# Patient Record
Sex: Female | Born: 1990 | Race: White | Hispanic: No | Smoking: Never smoker
Health system: Southern US, Community
[De-identification: ages and names within clinical notes are randomized; demographics above are authoritative.]

## PROBLEM LIST (undated history)

## (undated) ENCOUNTER — Inpatient Hospital Stay (HOSPITAL_COMMUNITY): Payer: Self-pay

## (undated) DIAGNOSIS — R000 Tachycardia, unspecified: Secondary | ICD-10-CM

## (undated) DIAGNOSIS — F419 Anxiety disorder, unspecified: Secondary | ICD-10-CM

## (undated) DIAGNOSIS — F32A Depression, unspecified: Secondary | ICD-10-CM

## (undated) DIAGNOSIS — F329 Major depressive disorder, single episode, unspecified: Secondary | ICD-10-CM

## (undated) DIAGNOSIS — D649 Anemia, unspecified: Secondary | ICD-10-CM

## (undated) DIAGNOSIS — K047 Periapical abscess without sinus: Secondary | ICD-10-CM

## (undated) DIAGNOSIS — E663 Overweight: Secondary | ICD-10-CM

## (undated) DIAGNOSIS — E876 Hypokalemia: Secondary | ICD-10-CM

## (undated) DIAGNOSIS — Z0000 Encounter for general adult medical examination without abnormal findings: Secondary | ICD-10-CM

## (undated) DIAGNOSIS — Z124 Encounter for screening for malignant neoplasm of cervix: Principal | ICD-10-CM

## (undated) DIAGNOSIS — M549 Dorsalgia, unspecified: Secondary | ICD-10-CM

## (undated) DIAGNOSIS — N946 Dysmenorrhea, unspecified: Secondary | ICD-10-CM

## (undated) DIAGNOSIS — T7840XA Allergy, unspecified, initial encounter: Secondary | ICD-10-CM

## (undated) DIAGNOSIS — G4700 Insomnia, unspecified: Secondary | ICD-10-CM

## (undated) DIAGNOSIS — S161XXA Strain of muscle, fascia and tendon at neck level, initial encounter: Secondary | ICD-10-CM

## (undated) HISTORY — DX: Tachycardia, unspecified: R00.0

## (undated) HISTORY — DX: Depression, unspecified: F32.A

## (undated) HISTORY — DX: Encounter for general adult medical examination without abnormal findings: Z00.00

## (undated) HISTORY — PX: WISDOM TOOTH EXTRACTION: SHX21

## (undated) HISTORY — DX: Dorsalgia, unspecified: M54.9

## (undated) HISTORY — DX: Anxiety disorder, unspecified: F41.9

## (undated) HISTORY — DX: Overweight: E66.3

## (undated) HISTORY — DX: Periapical abscess without sinus: K04.7

## (undated) HISTORY — DX: Encounter for screening for malignant neoplasm of cervix: Z12.4

## (undated) HISTORY — DX: Hypokalemia: E87.6

## (undated) HISTORY — DX: Anemia, unspecified: D64.9

## (undated) HISTORY — DX: Strain of muscle, fascia and tendon at neck level, initial encounter: S16.1XXA

## (undated) HISTORY — DX: Major depressive disorder, single episode, unspecified: F32.9

## (undated) HISTORY — DX: Dysmenorrhea, unspecified: N94.6

## (undated) HISTORY — DX: Insomnia, unspecified: G47.00

## (undated) HISTORY — DX: Allergy, unspecified, initial encounter: T78.40XA

---
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
## 2010-04-12 ENCOUNTER — Ambulatory Visit (HOSPITAL_COMMUNITY): Admission: RE | Admit: 2010-04-12 | Discharge: 2010-04-12 | Payer: Self-pay | Admitting: Internal Medicine

## 2010-04-21 ENCOUNTER — Emergency Department (HOSPITAL_COMMUNITY)
Admission: EM | Admit: 2010-04-21 | Discharge: 2010-04-22 | Payer: Self-pay | Source: Home / Self Care | Admitting: Emergency Medicine

## 2010-06-30 ENCOUNTER — Encounter: Payer: Self-pay | Admitting: Internal Medicine

## 2010-08-21 LAB — DIFFERENTIAL
Basophils Relative: 0 % (ref 0–1)
Eosinophils Absolute: 0 10*3/uL (ref 0.0–0.7)
Eosinophils Relative: 0 % (ref 0–5)
Lymphs Abs: 2.7 10*3/uL (ref 0.7–4.0)
Monocytes Absolute: 0.9 10*3/uL (ref 0.1–1.0)
Monocytes Relative: 7 % (ref 3–12)

## 2010-08-21 LAB — CBC
HCT: 35.4 % — ABNORMAL LOW (ref 36.0–46.0)
Hemoglobin: 12.3 g/dL (ref 12.0–15.0)
MCH: 30.5 pg (ref 26.0–34.0)
MCHC: 34.9 g/dL (ref 30.0–36.0)
MCV: 87.5 fL (ref 78.0–100.0)
RBC: 4.04 MIL/uL (ref 3.87–5.11)

## 2010-08-21 LAB — BASIC METABOLIC PANEL
CO2: 20 mEq/L (ref 19–32)
Calcium: 8.8 mg/dL (ref 8.4–10.5)
Chloride: 104 mEq/L (ref 96–112)
GFR calc Af Amer: >60 mL/min (ref >60)
Glucose, Bld: 101 mg/dL — ABNORMAL HIGH (ref 70–99)
Sodium: 135 mEq/L (ref 135–145)

## 2011-02-19 IMAGING — US US MISC SOFT TISSUE
1 series · 14 of 14 positions shown · non-contrast
Comparison: None.

CLINICAL DATA: Palpable "lump" in the right lower paramidline back.

ULTRASOUND OF HEAD/NECK SOFT TISSUES
TECHNIQUE: Ultrasound examination of the head and neck soft tissues
was performed in the area of clinical concern.

[Series 1: us misc soft tissue · 0.09mm/px · 14 of 14 slices shown]
[im 1/14]
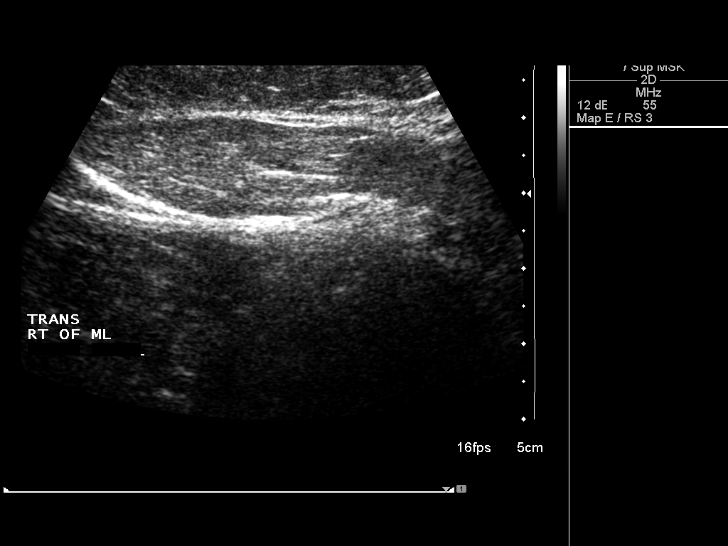
[im 2/14]
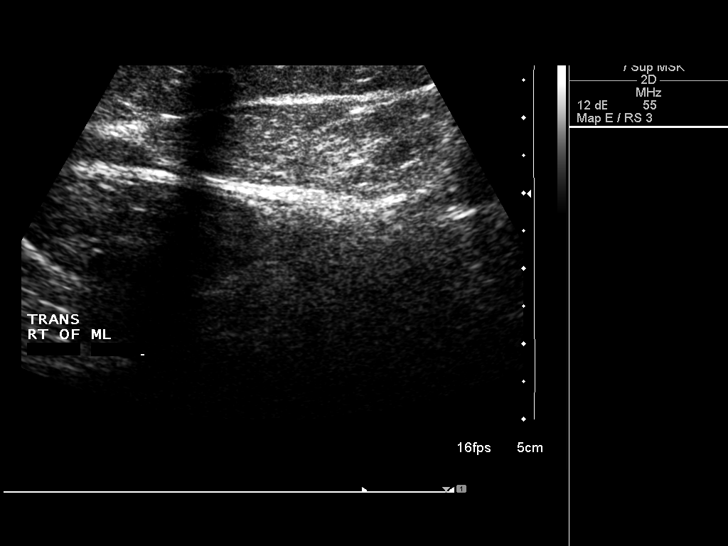
[im 3/14]
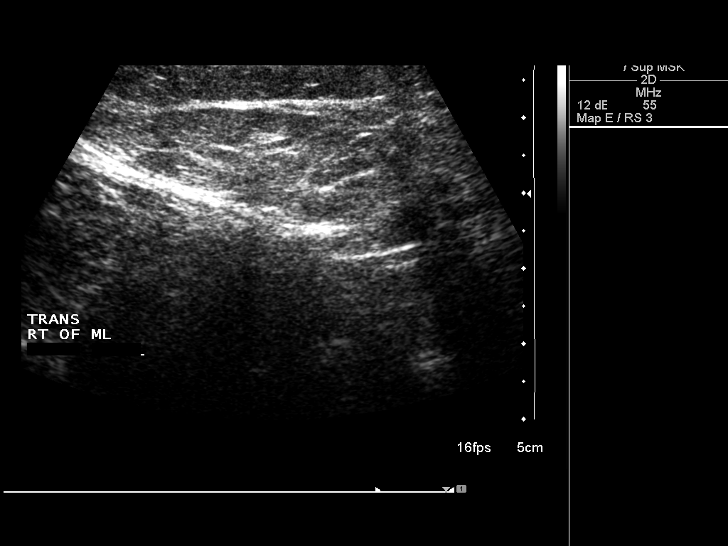
[im 4/14]
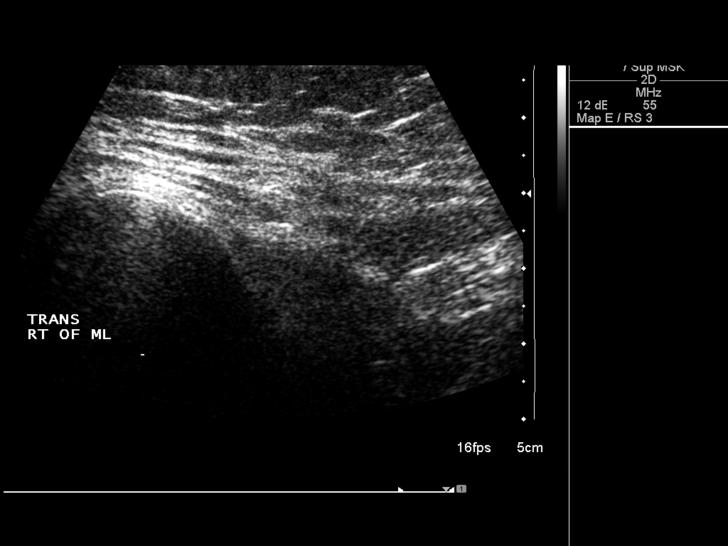
[im 5/14]
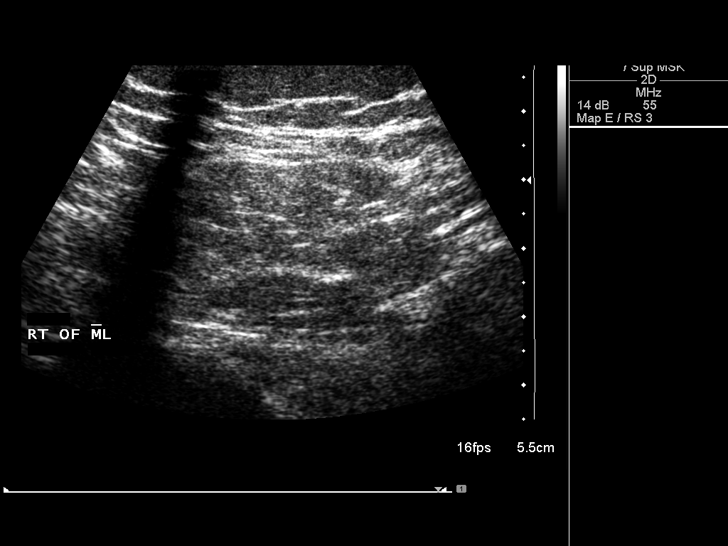
[im 6/14]
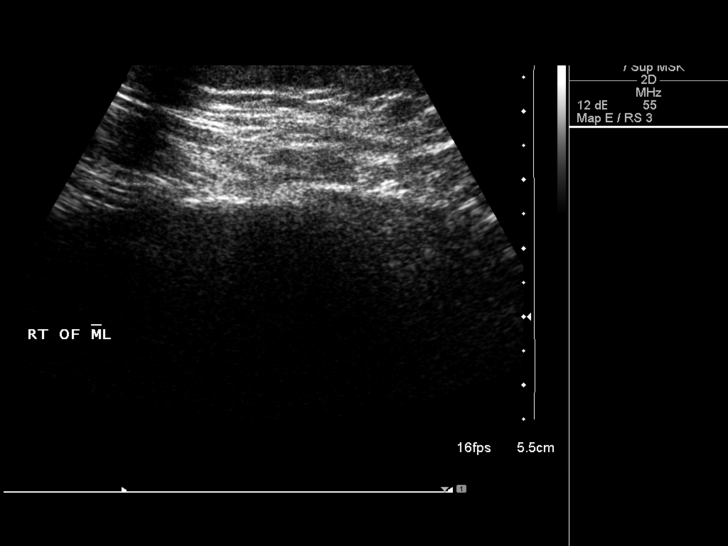
[im 7/14]
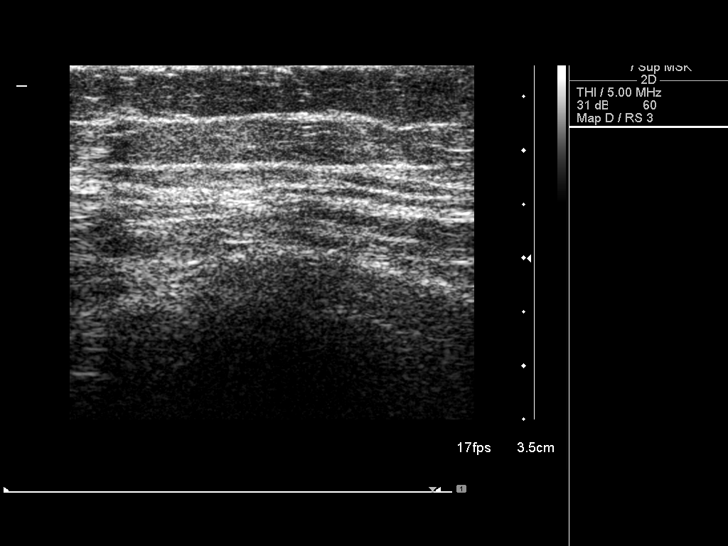
[im 8/14]
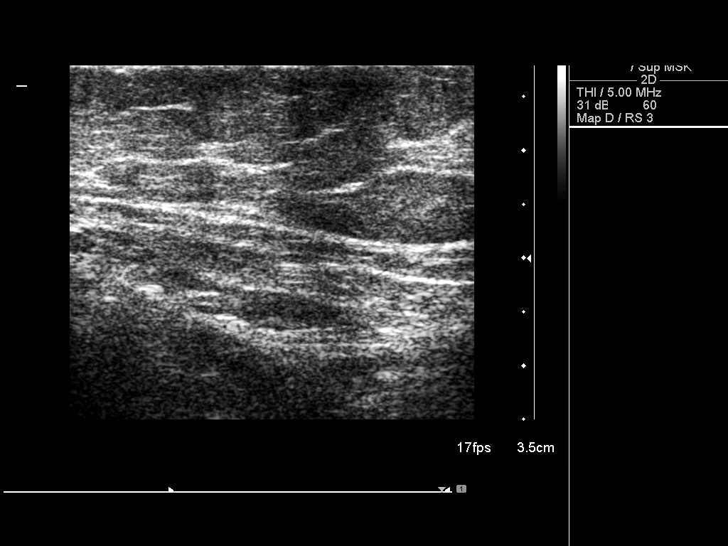
[im 9/14]
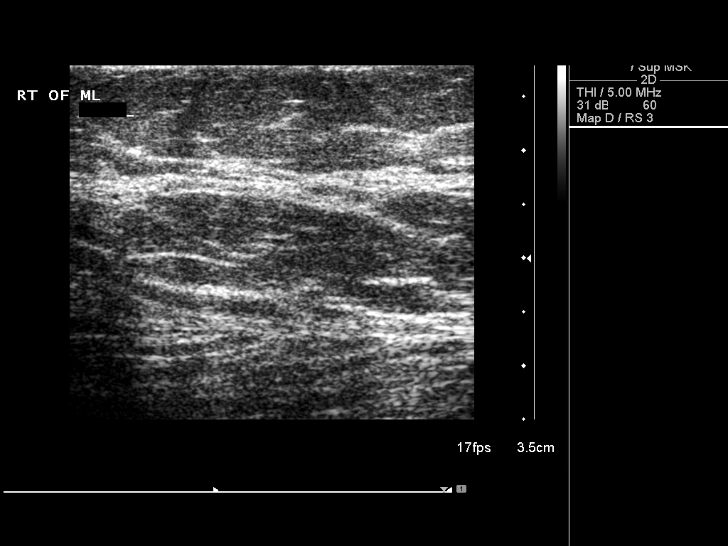
[im 10/14]
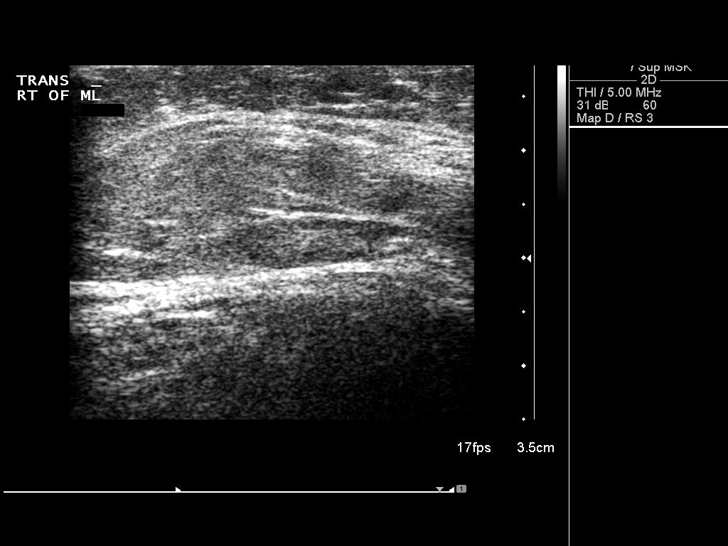
[im 11/14]
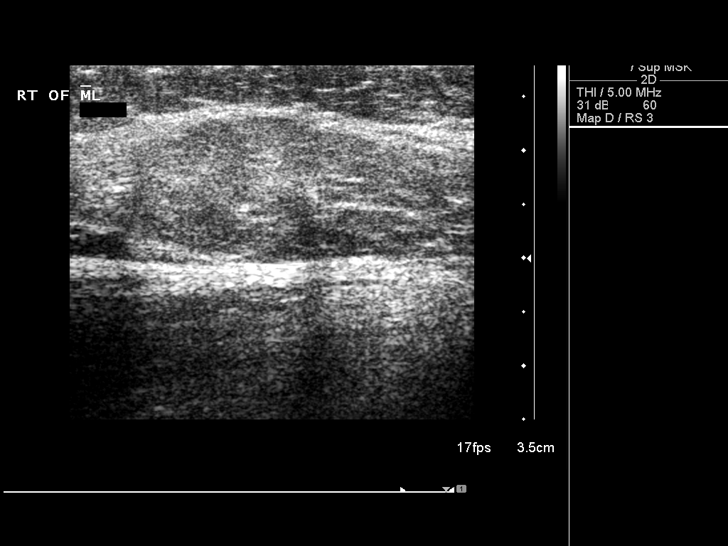
[im 12/14]
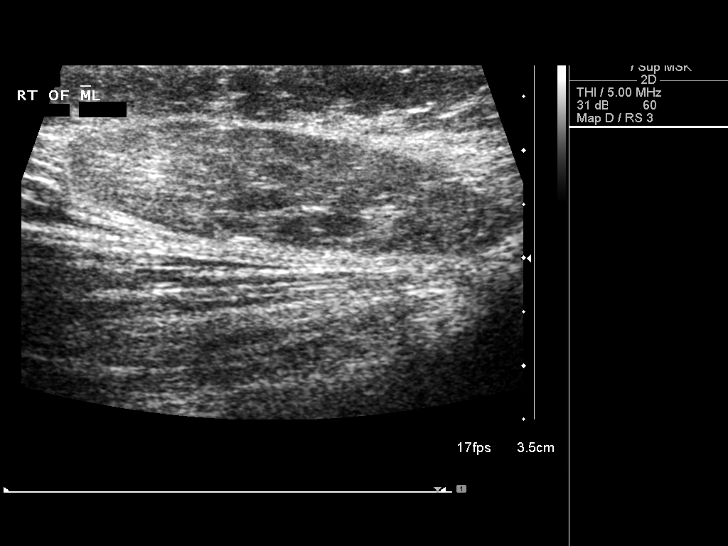
[im 13/14]
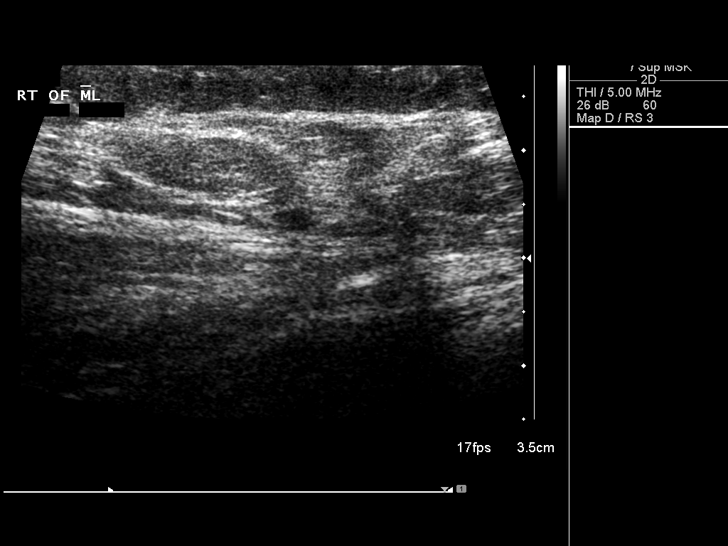
[im 14/14]
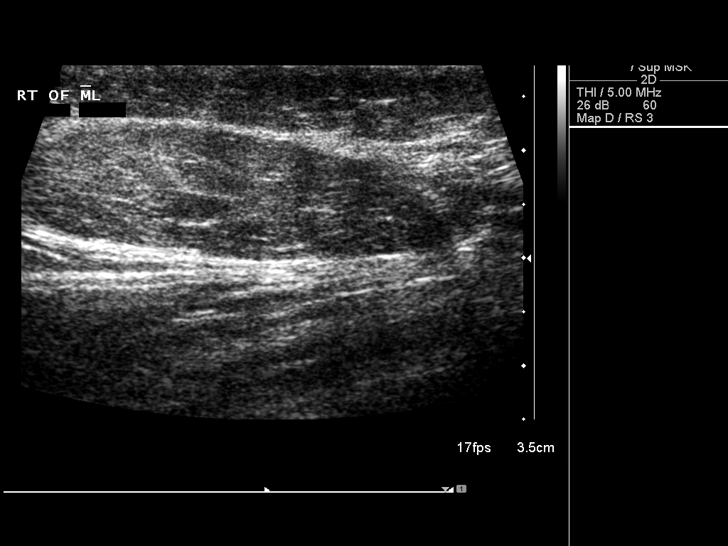

[14 of 14 positions shown; findings below may reference images not displayed]

FINDINGS: The the patient was able to identify a specific location
in the right paramidline lower back.  She demonstrated a palpable
"lump" in this region.  Evaluation of this area with the ultrasound
probe reveals no underlying mass lesion.  There is no underlying
cyst.  A prominent muscular band is noted in this area which
directly corresponding to the palpable finding.
IMPRESSION: The palpable area of concern represents a normal-appearing discrete
muscular structure in the paramidline lower back.

## 2011-06-25 ENCOUNTER — Ambulatory Visit (INDEPENDENT_AMBULATORY_CARE_PROVIDER_SITE_OTHER): Payer: BC Managed Care – PPO | Admitting: Family Medicine

## 2011-06-25 ENCOUNTER — Encounter: Payer: Self-pay | Admitting: Family Medicine

## 2011-06-25 ENCOUNTER — Ambulatory Visit (HOSPITAL_BASED_OUTPATIENT_CLINIC_OR_DEPARTMENT_OTHER)
Admission: RE | Admit: 2011-06-25 | Discharge: 2011-06-25 | Disposition: A | Discharge status: Home / Self Care | Payer: BC Managed Care – PPO | Source: Ambulatory Visit | Attending: Family Medicine | Admitting: Family Medicine

## 2011-06-25 VITALS — BP 120/76 | HR 104 | Temp 98.4°F | Ht 64.0 in | Wt 169.8 lb

## 2011-06-25 DIAGNOSIS — T7840XA Allergy, unspecified, initial encounter: Secondary | ICD-10-CM

## 2011-06-25 DIAGNOSIS — R112 Nausea with vomiting, unspecified: Secondary | ICD-10-CM

## 2011-06-25 DIAGNOSIS — F341 Dysthymic disorder: Secondary | ICD-10-CM

## 2011-06-25 DIAGNOSIS — Z309 Encounter for contraceptive management, unspecified: Secondary | ICD-10-CM

## 2011-06-25 DIAGNOSIS — F419 Anxiety disorder, unspecified: Secondary | ICD-10-CM | POA: Insufficient documentation

## 2011-06-25 DIAGNOSIS — M5410 Radiculopathy, site unspecified: Secondary | ICD-10-CM

## 2011-06-25 DIAGNOSIS — G43909 Migraine, unspecified, not intractable, without status migrainosus: Secondary | ICD-10-CM

## 2011-06-25 DIAGNOSIS — Z0000 Encounter for general adult medical examination without abnormal findings: Secondary | ICD-10-CM

## 2011-06-25 DIAGNOSIS — Z23 Encounter for immunization: Secondary | ICD-10-CM

## 2011-06-25 DIAGNOSIS — M545 Low back pain, unspecified: Secondary | ICD-10-CM | POA: Insufficient documentation

## 2011-06-25 DIAGNOSIS — M79609 Pain in unspecified limb: Secondary | ICD-10-CM

## 2011-06-25 DIAGNOSIS — F418 Other specified anxiety disorders: Secondary | ICD-10-CM

## 2011-06-25 DIAGNOSIS — IMO0002 Reserved for concepts with insufficient information to code with codable children: Secondary | ICD-10-CM

## 2011-06-25 DIAGNOSIS — M549 Dorsalgia, unspecified: Secondary | ICD-10-CM | POA: Insufficient documentation

## 2011-06-25 DIAGNOSIS — F411 Generalized anxiety disorder: Secondary | ICD-10-CM

## 2011-06-25 DIAGNOSIS — R229 Localized swelling, mass and lump, unspecified: Secondary | ICD-10-CM | POA: Insufficient documentation

## 2011-06-25 DIAGNOSIS — F329 Major depressive disorder, single episode, unspecified: Secondary | ICD-10-CM | POA: Insufficient documentation

## 2011-06-25 DIAGNOSIS — R29898 Other symptoms and signs involving the musculoskeletal system: Secondary | ICD-10-CM

## 2011-06-25 MED ORDER — VENLAFAXINE HCL ER 75 MG PO CP24: 75.0000 mg | ORAL_CAPSULE | Freq: Every day | ORAL | Status: DC

## 2011-06-25 MED ORDER — PROMETHAZINE HCL 25 MG PO TABS: 25.0000 mg | ORAL_TABLET | Freq: Three times a day (TID) | ORAL | Status: DC | PRN

## 2011-06-25 MED ORDER — ALPRAZOLAM 0.5 MG PO TBDP: 0.5000 mg | ORAL_TABLET | Freq: Two times a day (BID) | ORAL | Status: DC | PRN

## 2011-06-25 MED ORDER — HYDROCODONE-ACETAMINOPHEN 5-325 MG PO TABS: 1.0000 | ORAL_TABLET | Freq: Four times a day (QID) | ORAL | Status: DC | PRN

## 2011-06-25 MED ORDER — VENLAFAXINE HCL ER 37.5 MG PO CP24: 37.5000 mg | ORAL_CAPSULE | Freq: Every day | ORAL | Status: DC

## 2011-06-25 NOTE — Patient Instructions (Signed)

## 2011-06-28 ENCOUNTER — Encounter: Payer: Self-pay | Admitting: Family Medicine

## 2011-06-28 DIAGNOSIS — G43909 Migraine, unspecified, not intractable, without status migrainosus: Secondary | ICD-10-CM | POA: Insufficient documentation

## 2011-06-28 DIAGNOSIS — Z309 Encounter for contraceptive management, unspecified: Secondary | ICD-10-CM | POA: Insufficient documentation

## 2011-06-28 DIAGNOSIS — Z0000 Encounter for general adult medical examination without abnormal findings: Secondary | ICD-10-CM

## 2011-06-28 DIAGNOSIS — T7840XA Allergy, unspecified, initial encounter: Secondary | ICD-10-CM

## 2011-06-28 HISTORY — DX: Encounter for general adult medical examination without abnormal findings: Z00.00

## 2011-06-28 HISTORY — DX: Allergy, unspecified, initial encounter: T78.40XA

## 2011-06-28 NOTE — Assessment & Plan Note (Signed)
Uses Zyrtec prn, no symptoms presently

## 2011-06-28 NOTE — Assessment & Plan Note (Signed)
Gets them often premenstrually, encouraged good sleep, good hydration and regular exertion, is allowed a few Promethazine and Hydrocone to use sparingly, she has tried multiple meds in past and these help the most

## 2011-06-28 NOTE — Progress Notes (Signed)
Patient ID: Kathryn Randall, female   DOB: 1990/10/12, 21 y.o.   MRN: [id number] ESPERANSA SARABIA [id number] May 16, 1991 06/28/2011      Progress Note New Patient  Subjective  Chief Complaint  Chief Complaint  Patient presents with  . Establish Care    new patient    HPI Patient is a year-old Caucasian female who is in today for new patient appointment. She is accompanied by her mother. They will put a long family history of migraines and the patient herself has been most often before her cycles. She tried multiple meds in the past but really only Phenergan and hydrocodone seemed to break him when she needs them. She struggles with anxiety and panic disorder as well but has not done well with daily medications in the past. Reports using up recently. Frequently with good results as needed. No recent illness, fevers, chills, chest pain, palpitations, shortness of breath, GI or GU complaints. She struggles with allergies at times but is not needing any medications at the present time  Past Medical History  Diagnosis Date  . Anxiety   . Depression   . Back pain   . Migraine 06/28/2011  . Allergic state 06/28/2011  . Preventative health care 06/28/2011    Past Surgical History  Procedure Date  . Wisdom tooth extraction 21 yrs old    Family History  Problem Relation Age of Onset  . Migraines Mother   . Migraines Father   . Obesity Maternal Grandmother   . Heart disease Maternal Grandmother     CHF  . Hypertension Maternal Grandmother   . Diabetes Maternal Grandfather     type 2  . Obesity Maternal Grandfather   . Leukemia Paternal Grandfather   . Migraines Paternal Grandfather     History   Social History  . Marital Status: Single    Spouse Name: N/A    Number of Children: N/A  . Years of Education: N/A   Occupational History  . Not on file.   Social History Main Topics  . Smoking status: Current Some Day Smoker  . Smokeless tobacco: Never Used   Comment: black  and milds every once in awhile  . Alcohol Use: No  . Drug Use: No  . Sexually Active: Yes -- Female partner(s)   Other Topics Concern  . Not on file   Social History Narrative  . No narrative on file    No current outpatient prescriptions on file prior to visit.    Allergies  Allergen Reactions  . Ibuprofen     Racing heart  . Morphine And Related Itching    Review of Systems  Review of Systems  Constitutional: Negative for fever, chills and malaise/fatigue.  HENT: Negative for hearing loss, nosebleeds and congestion.   Eyes: Negative for discharge.  Respiratory: Negative for cough, sputum production, shortness of breath and wheezing.   Cardiovascular: Negative for chest pain, palpitations and leg swelling.  Gastrointestinal: Negative for heartburn, nausea, vomiting, abdominal pain, diarrhea, constipation and blood in stool.  Genitourinary: Negative for dysuria, urgency, frequency and hematuria.  Musculoskeletal: Positive for back pain. Negative for myalgias and falls.  Skin: Negative for rash.  Neurological: Positive for headaches. Negative for dizziness, tremors, sensory change, focal weakness, loss of consciousness and weakness.  Endo/Heme/Allergies: Negative for polydipsia. Does not bruise/bleed easily.  Psychiatric/Behavioral: Negative for depression and suicidal ideas. The patient is nervous/anxious. The patient does not have insomnia.     Objective  BP 120/76  Pulse 104  Temp(Src) 98.4 F (36.9 C) (Temporal)  Ht 5\' 4"  (1.626 m)  Wt 169 lb 12.8 oz (77.021 kg)  BMI 29.15 kg/m2  SpO2 99%  LMP 06/09/2011  Physical Exam  Physical Exam  Constitutional: She is oriented to person, place, and time and well-developed, well-nourished, and in no distress. No distress.  HENT:  Head: Normocephalic and atraumatic.  Right Ear: External ear normal.  Left Ear: External ear normal.  Nose: Nose normal.  Mouth/Throat: Oropharynx is clear and moist. No oropharyngeal  exudate.  Eyes: Conjunctivae are normal. Pupils are equal, round, and reactive to light. Right eye exhibits no discharge. Left eye exhibits no discharge. No scleral icterus.  Neck: Normal range of motion. Neck supple. No thyromegaly present.  Cardiovascular: Normal rate, regular rhythm, normal heart sounds and intact distal pulses.   No murmur heard. Pulmonary/Chest: Effort normal and breath sounds normal. No respiratory distress. She has no wheezes. She has no rales.  Abdominal: Soft. Bowel sounds are normal. She exhibits no distension and no mass. There is no tenderness.  Musculoskeletal: Normal range of motion. She exhibits no edema and no tenderness.  Lymphadenopathy:    She has no cervical adenopathy.  Neurological: She is alert and oriented to person, place, and time. She has normal reflexes. No cranial nerve deficit. Coordination normal.  Skin: Skin is warm and dry. No rash noted. She is not diaphoretic.  Psychiatric: Mood, memory and affect normal.       Assessment & Plan  Preventative health care Tdap given today. Flu shot given today. Previous records requested. Encouraged regular exercise,heart healthy diet  Migraine Gets them often premenstrually, encouraged good sleep, good hydration and regular exertion, is allowed a few Promethazine and Hydrocone to use sparingly, she has tried multiple meds in past and these help the most  Contraceptive management Patient reports having trouble finding a pill that she tolerates is happy with her present tab, will not make any changes and request old records. Return in 1 month for pap  Anxiety Patient with panic attacks at times. Will be allowed very few Alprazolam to use prn and she is asked to consider  A daily SSRI  Allergic state Uses Zyrtec prn, no symptoms presently  Back pain Long history of low back pain. Encouraged weight loss, moist heat, gentle stretching, xrays performed and referred to ortho

## 2011-06-28 NOTE — Assessment & Plan Note (Signed)
Tdap given today. Flu shot given today. Previous records requested. Encouraged regular exercise,heart healthy diet

## 2011-06-28 NOTE — Assessment & Plan Note (Signed)
Patient with panic attacks at times. Will be allowed very few Alprazolam to use prn and she is asked to consider  A daily SSRI

## 2011-06-28 NOTE — Assessment & Plan Note (Signed)
Patient reports having trouble finding a pill that she tolerates is happy with her present tab, will not make any changes and request old records. Return in 1 month for pap

## 2011-06-28 NOTE — Assessment & Plan Note (Signed)
Long history of low back pain. Encouraged weight loss, moist heat, gentle stretching, xrays performed and referred to ortho

## 2011-07-17 ENCOUNTER — Other Ambulatory Visit (HOSPITAL_COMMUNITY)
Admission: RE | Admit: 2011-07-17 | Discharge: 2011-07-17 | Disposition: A | Discharge status: Home / Self Care | Payer: BC Managed Care – PPO | Source: Ambulatory Visit | Attending: Family Medicine | Admitting: Family Medicine

## 2011-07-17 ENCOUNTER — Ambulatory Visit (INDEPENDENT_AMBULATORY_CARE_PROVIDER_SITE_OTHER): Payer: BC Managed Care – PPO | Admitting: Family Medicine

## 2011-07-17 ENCOUNTER — Other Ambulatory Visit: Payer: Self-pay | Admitting: Family Medicine

## 2011-07-17 ENCOUNTER — Encounter: Payer: Self-pay | Admitting: Family Medicine

## 2011-07-17 DIAGNOSIS — N946 Dysmenorrhea, unspecified: Secondary | ICD-10-CM

## 2011-07-17 DIAGNOSIS — M549 Dorsalgia, unspecified: Secondary | ICD-10-CM

## 2011-07-17 DIAGNOSIS — N760 Acute vaginitis: Secondary | ICD-10-CM

## 2011-07-17 DIAGNOSIS — Z0000 Encounter for general adult medical examination without abnormal findings: Secondary | ICD-10-CM

## 2011-07-17 DIAGNOSIS — F329 Major depressive disorder, single episode, unspecified: Secondary | ICD-10-CM | POA: Insufficient documentation

## 2011-07-17 DIAGNOSIS — Z309 Encounter for contraceptive management, unspecified: Secondary | ICD-10-CM

## 2011-07-17 DIAGNOSIS — R350 Frequency of micturition: Secondary | ICD-10-CM

## 2011-07-17 DIAGNOSIS — F419 Anxiety disorder, unspecified: Secondary | ICD-10-CM

## 2011-07-17 DIAGNOSIS — Z124 Encounter for screening for malignant neoplasm of cervix: Secondary | ICD-10-CM

## 2011-07-17 DIAGNOSIS — Z113 Encounter for screening for infections with a predominantly sexual mode of transmission: Secondary | ICD-10-CM | POA: Insufficient documentation

## 2011-07-17 DIAGNOSIS — F341 Dysthymic disorder: Secondary | ICD-10-CM

## 2011-07-17 DIAGNOSIS — R319 Hematuria, unspecified: Secondary | ICD-10-CM

## 2011-07-17 DIAGNOSIS — G43909 Migraine, unspecified, not intractable, without status migrainosus: Secondary | ICD-10-CM

## 2011-07-17 DIAGNOSIS — Z01419 Encounter for gynecological examination (general) (routine) without abnormal findings: Secondary | ICD-10-CM | POA: Insufficient documentation

## 2011-07-17 DIAGNOSIS — F32A Depression, unspecified: Secondary | ICD-10-CM

## 2011-07-17 HISTORY — DX: Depression, unspecified: F32.A

## 2011-07-17 HISTORY — DX: Anxiety disorder, unspecified: F41.9

## 2011-07-17 LAB — CBC
HCT: 35.8 % — ABNORMAL LOW (ref 36.0–46.0)
Hemoglobin: 11.9 g/dL — ABNORMAL LOW (ref 12.0–15.0)
MCHC: 33.4 g/dL (ref 30.0–36.0)
MCV: 89.4 fl (ref 78.0–100.0)
RDW: 13.7 % (ref 11.5–14.6)
WBC: 7.7 10*3/uL (ref 4.5–10.5)

## 2011-07-17 LAB — RENAL FUNCTION PANEL
CO2: 25 mEq/L (ref 19–32)
Creatinine, Ser: 0.8 mg/dL (ref 0.4–1.2)
GFR: 92.86 mL/min (ref >60.00)
Potassium: 3.4 mEq/L — ABNORMAL LOW (ref 3.5–5.1)
Sodium: 139 mEq/L (ref 135–145)

## 2011-07-17 LAB — HEPATIC FUNCTION PANEL
AST: 18 U/L (ref 0–37)
Albumin: 3.5 g/dL (ref 3.5–5.2)
Total Bilirubin: 0.2 mg/dL — ABNORMAL LOW (ref 0.3–1.2)

## 2011-07-17 LAB — LIPID PANEL
Cholesterol: 163 mg/dL (ref 0–200)
HDL: 81.9 mg/dL (ref >39.00)

## 2011-07-17 LAB — POCT URINALYSIS DIPSTICK
Nitrite, UA: NEGATIVE
Protein, UA: NEGATIVE
Urobilinogen, UA: 0.2
pH, UA: 6.5

## 2011-07-17 LAB — TSH: TSH: 1.61 u[IU]/mL (ref 0.35–5.50)

## 2011-07-17 LAB — HIV ANTIBODY (ROUTINE TESTING W REFLEX): HIV: NONREACTIVE

## 2011-07-17 MED ORDER — NAPROXEN 500 MG PO TABS: 500.0000 mg | ORAL_TABLET | Freq: Two times a day (BID) | ORAL | Status: DC

## 2011-07-17 MED ORDER — DROSPIREN-ETH ESTRAD-LEVOMEFOL 3-0.02-0.451 MG PO TABS: 1.0000 | ORAL_TABLET | Freq: Every day | ORAL | Status: DC

## 2011-07-17 MED ORDER — LORAZEPAM 0.5 MG PO TABS: 0.5000 mg | ORAL_TABLET | Freq: Two times a day (BID) | ORAL | Status: AC | PRN

## 2011-07-17 MED ORDER — VENLAFAXINE HCL ER 150 MG PO CP24: 150.0000 mg | ORAL_CAPSULE | Freq: Every day | ORAL | Status: DC

## 2011-07-17 MED ORDER — HYDROCODONE-ACETAMINOPHEN 5-325 MG PO TABS: 1.0000 | ORAL_TABLET | Freq: Four times a day (QID) | ORAL | Status: AC | PRN

## 2011-07-17 MED ORDER — CYCLOBENZAPRINE HCL 10 MG PO TABS: 10.0000 mg | ORAL_TABLET | Freq: Every evening | ORAL | Status: AC | PRN

## 2011-07-17 NOTE — Assessment & Plan Note (Addendum)
Pap smear and STD testing done today, vaginal discharge suggestive of yeast. Will await test results before treating as patient is minimally symptomatic. [facility] OCP sent in for patient

## 2011-07-17 NOTE — Assessment & Plan Note (Signed)
Not overly frequent at this time but is allowed a refill on her Hydrocodone to help manage them encouraged adequate sleep, hydration and nutrition

## 2011-07-17 NOTE — Progress Notes (Signed)
Patient ID: Gilda Crease, female   DOB: 1990/08/26, 21 y.o.   MRN: [id number] ROE KOFFMAN [id number] 09/16/1990 07/17/2011      Progress Note-Follow Up  Subjective  Chief Complaint  Chief Complaint  Patient presents with  . Annual Exam    Physical  . Gynecologic Exam    pap    HPI  Patient is a 21 year old Caucasian female in today for Pap smear, contraceptive management and follow up on anxiety and depression. Overall she is looking great improvement on her venlafaxine. Her mother and her grandmother agree. She is more calm and less anxious. Unfortunately she does continue to have some panic attacks and had a very bad one last night. Last night she had shaking, palpitations, shortness or breath, chest pain and was out of alprazolam. She notes that it helped some when she had it but it did cause excessive sedation after she used it. She is use lorazepam in the past with better affect and less sedation. No other acute illness. No recent the chest pain, palpitation shortness of breath GI or GU complaints. Does occasionally still get a migraine but they're less frequent hydrocodone does help. She did wake up with a spasm in her muscle above her right hip this morning secondary to her panic attack last night most likely. No incontinence. There is some radiation of the pain from the hip down into the posterior leg with certain positions. No numbness, tingling, weakness or other complaints noted. She is sexually active with a single partner. G0P0, LMP 07/10/2011. Has noted some urinary frequency and had an episode of intense RLQ pain recently which has resolved  Past Medical History  Diagnosis Date  . Anxiety   . Depression   . Back pain   . Migraine 06/28/2011  . Allergic state 06/28/2011  . Preventative health care 06/28/2011  . Anxiety and depression 07/17/2011    Past Surgical History  Procedure Date  . Wisdom tooth extraction 21 yrs old    Family History  Problem Relation Age  of Onset  . Migraines Mother   . Migraines Father   . Obesity Maternal Grandmother   . Heart disease Maternal Grandmother     CHF  . Hypertension Maternal Grandmother   . Diabetes Maternal Grandfather     type 2  . Obesity Maternal Grandfather   . Leukemia Paternal Grandfather   . Migraines Paternal Grandfather     History   Social History  . Marital Status: Single    Spouse Name: N/A    Number of Children: N/A  . Years of Education: N/A   Occupational History  . Not on file.   Social History Main Topics  . Smoking status: Current Some Day Smoker  . Smokeless tobacco: Never Used   Comment: black and milds every once in awhile  . Alcohol Use: No  . Drug Use: No  . Sexually Active: Yes -- Female partner(s)   Other Topics Concern  . Not on file   Social History Narrative  . No narrative on file    No current outpatient prescriptions on file prior to visit.    Allergies  Allergen Reactions  . Ibuprofen     Racing heart  . Morphine And Related Itching    Review of Systems  Review of Systems  Constitutional: Negative for fever and malaise/fatigue.  HENT: Negative for congestion.   Eyes: Negative for discharge.  Respiratory: Positive for shortness of breath.   Cardiovascular: Positive for chest pain.  Negative for palpitations and leg swelling.  Gastrointestinal: Negative for nausea, abdominal pain and diarrhea.  Genitourinary: Negative for dysuria.  Musculoskeletal: Positive for back pain. Negative for falls.  Skin: Negative for rash.  Neurological: Negative for loss of consciousness and headaches.  Endo/Heme/Allergies: Negative for polydipsia.  Psychiatric/Behavioral: Positive for depression. Negative for suicidal ideas. The patient is nervous/anxious. The patient does not have insomnia.        Had a bad panic attack last night with shaking, shortness of breath, chest pain or palpitations. There are usually not quite so bad. However he landed on some other  cause more sedation than tonight. She reports in the past she has used Lorazepam and it causes less sedation and leaves her calm longer    Objective  BP 124/82  Pulse 105  Temp(Src) 99.3 F (37.4 C) (Temporal)  Ht 5\' 4"  (1.626 m)  Wt 180 lb (81.647 kg)  BMI 30.90 kg/m2  SpO2 100%  LMP 07/02/2011  Physical Exam  Physical Exam  Constitutional: She is oriented to person, place, and time and well-developed, well-nourished, and in no distress. No distress.  HENT:  Head: Normocephalic and atraumatic.  Right Ear: External ear normal.  Left Ear: External ear normal.  Nose: Nose normal.  Mouth/Throat: Oropharynx is clear and moist. No oropharyngeal exudate.  Eyes: Conjunctivae are normal. Pupils are equal, round, and reactive to light. Right eye exhibits no discharge. Left eye exhibits no discharge. No scleral icterus.  Neck: Normal range of motion. Neck supple. No thyromegaly present.  Cardiovascular: Normal rate, regular rhythm, normal heart sounds and intact distal pulses.   No murmur heard. Pulmonary/Chest: Effort normal and breath sounds normal. No respiratory distress. She has no wheezes. She has no rales.  Abdominal: Soft. Bowel sounds are normal. She exhibits no distension and no mass. There is no tenderness.  Genitourinary: Uterus normal, cervix normal, right adnexa normal and left adnexa normal. Vaginal discharge found.       Thick white vaginal discharge  Musculoskeletal: Normal range of motion. She exhibits no edema and no tenderness.  Lymphadenopathy:    She has no cervical adenopathy.  Neurological: She is alert and oriented to person, place, and time. She has normal reflexes. No cranial nerve deficit. Coordination normal.  Skin: Skin is warm and dry. No rash noted. She is not diaphoretic.  Psychiatric: Mood, memory and affect normal.    No results found for this basename: TSH   Lab Results  Component Value Date   WBC 13.5* 04/21/2010   HGB 12.3 04/21/2010   HCT  35.4* 04/21/2010   MCV 87.5 04/21/2010   PLT 267 04/21/2010   Lab Results  Component Value Date   CREATININE 0.78 04/21/2010   BUN 7 04/21/2010   NA 135 04/21/2010   K 4.4 04/21/2010   CL 104 04/21/2010   CO2 20 04/21/2010   No results found for this basename: ALT, AST, GGT, ALKPHOS, BILITOT   No results found for this basename: CHOL   No results found for this basename: HDL   No results found for this basename: LDLCALC   No results found for this basename: TRIG   No results found for this basename: CHOLHDL     Assessment & Plan  Anxiety and depression Shana mother both agree that she is notably better but the patient herself still notes irritability and some anhedonia. We will try increasing from venlafaxine 75 to 150 daily. She will notify us if she has any troubles. She feels as  if alprazolam as well too quickly and leaves her groggy. She's taken lorazepam in the past less sedation. We will try switching to lorazepam once again warned to use in small amounts and infrequently as it can be addictive if overly used. She expresses understanding. Reassess in 2 months or as needed  Contraceptive management Pap smear and STD testing done today, vaginal discharge suggestive of yeast. Will await test results before treating as patient is minimally symptomatic. [facility] OCP sent in for patient  Migraine Not overly frequent at this time but is allowed a refill on her Hydrocodone to help manage them encouraged adequate sleep, hydration and nutrition  Back pain Woke up this am with a spasm in a muscle above right hip, she did have a major panic attack last night and acknowledges her muscles tensed up. Will try Cyclobenzaprine qhs and Naproxen in am, as needed. Try moist heat and stretching  Preventative health care Labs run today

## 2011-07-17 NOTE — Assessment & Plan Note (Signed)
Edson Snowball mother both agree that she is notably better but the patient herself still notes irritability and some anhedonia. We will try increasing from venlafaxine 75 to 150 daily. She will notify us if she has any troubles. She feels as if alprazolam as well too quickly and leaves her groggy. She's taken lorazepam in the past less sedation. We will try switching to lorazepam once again warned to use in small amounts and infrequently as it can be addictive if overly used. She expresses understanding. Reassess in 2 months or as needed

## 2011-07-17 NOTE — Assessment & Plan Note (Addendum)
Woke up this am with a spasm in a muscle above right hip, she did have a major panic attack last night and acknowledges her muscles tensed up. Will try Cyclobenzaprine qhs and Naproxen in am, as needed. Try moist heat and stretching

## 2011-07-17 NOTE — Assessment & Plan Note (Signed)
Labs run today 

## 2011-07-17 NOTE — Patient Instructions (Signed)
Back Pain, Adult Low back pain is very common. About 1 in 5 people have back pain.The cause of low back pain is rarely dangerous. The pain often gets better over time.About half of people with a sudden onset of back pain feel better in just 2 weeks. About 8 in 10 people feel better by 6 weeks.  CAUSES Some common causes of back pain include:  Strain of the muscles or ligaments supporting the spine.   Wear and tear (degeneration) of the spinal discs.   Arthritis.   Direct injury to the back.  DIAGNOSIS Most of the time, the direct cause of low back pain is not known.However, back pain can be treated effectively even when the exact cause of the pain is unknown.Answering your caregiver's questions about your overall health and symptoms is one of the most accurate ways to make sure the cause of your pain is not dangerous. If your caregiver needs more information, he or she may order lab work or imaging tests (X-rays or MRIs).However, even if imaging tests show changes in your back, this usually does not require surgery. HOME CARE INSTRUCTIONS For many people, back pain returns.Since low back pain is rarely dangerous, it is often a condition that people can learn to manageon their own.   Remain active. It is stressful on the back to sit or stand in one place. Do not sit, drive, or stand in one place for more than 30 minutes at a time. Take short walks on level surfaces as soon as pain allows.Try to increase the length of time you walk each day.   Do not stay in bed.Resting more than 1 or 2 days can delay your recovery.   Do not avoid exercise or work.Your body is made to move.It is not dangerous to be active, even though your back may hurt.Your back will likely heal faster if you return to being active before your pain is gone.   Pay attention to your body when you bend and lift. Many people have less discomfortwhen lifting if they bend their knees, keep the load close to their  bodies,and avoid twisting. Often, the most comfortable positions are those that put less stress on your recovering back.   Find a comfortable position to sleep. Use a firm mattress and lie on your side with your knees slightly bent. If you lie on your back, put a pillow under your knees.   Only take over-the-counter or prescription medicines as directed by your caregiver. Over-the-counter medicines to reduce pain and inflammation are often the most helpful.Your caregiver may prescribe muscle relaxant drugs.These medicines help dull your pain so you can more quickly return to your normal activities and healthy exercise.   Put ice on the injured area.   Put ice in a plastic bag.   Place a towel between your skin and the bag.   Leave the ice on for 15 to 20 minutes, 3 to 4 times a day for the first 2 to 3 days. After that, ice and heat may be alternated to reduce pain and spasms.   Ask your caregiver about trying back exercises and gentle massage. This may be of some benefit.   Avoid feeling anxious or stressed.Stress increases muscle tension and can worsen back pain.It is important to recognize when you are anxious or stressed and learn ways to manage it.Exercise is a great option.  SEEK MEDICAL CARE IF:  You have pain that is not relieved with rest or medicine.   You have   pain that does not improve in 1 week.   You have new symptoms.   You are generally not feeling well.  SEEK IMMEDIATE MEDICAL CARE IF:   You have pain that radiates from your back into your legs.   You develop new bowel or bladder control problems.   You have unusual weakness or numbness in your arms or legs.   You develop nausea or vomiting.   You develop abdominal pain.   You feel faint.  Document Released: 05/27/2005 Document Revised: 02/06/2011 Document Reviewed: 10/15/2010 [employer] Patient Information 2012 [employer]. 

## 2011-07-18 ENCOUNTER — Telehealth: Payer: Self-pay

## 2011-07-18 LAB — HSV(HERPES SIMPLEX VRS) I + II AB-IGG: HSV 2 Glycoprotein G Ab, IgG: <0.1 IV

## 2011-07-18 LAB — RPR

## 2011-07-18 NOTE — Telephone Encounter (Signed)
Patient informed of lab results. 

## 2011-07-19 MED ORDER — CIPROFLOXACIN HCL 500 MG PO TABS: 500.0000 mg | ORAL_TABLET | Freq: Two times a day (BID) | ORAL | Status: DC

## 2011-07-19 NOTE — Progress Notes (Signed)
Addended by: Court Joy on: 07/19/2011 01:42 PM   Modules accepted: Orders

## 2011-07-19 NOTE — Progress Notes (Signed)
Patient notified and states understandment

## 2011-07-20 LAB — URINE CULTURE

## 2011-07-24 LAB — HSV 1 AND 2 IGM ABS, INDIRECT

## 2011-07-29 ENCOUNTER — Other Ambulatory Visit: Payer: Self-pay | Admitting: Family Medicine

## 2011-07-29 MED ORDER — METRONIDAZOLE 500 MG PO TABS: 500.0000 mg | ORAL_TABLET | Freq: Two times a day (BID) | ORAL | Status: AC

## 2011-08-14 ENCOUNTER — Telehealth: Payer: Self-pay

## 2011-08-15 NOTE — Telephone Encounter (Signed)
Pt was scheduled an appt.

## 2011-08-16 ENCOUNTER — Encounter: Payer: Self-pay | Admitting: Family Medicine

## 2011-08-16 ENCOUNTER — Ambulatory Visit (INDEPENDENT_AMBULATORY_CARE_PROVIDER_SITE_OTHER): Payer: BC Managed Care – PPO | Admitting: Family Medicine

## 2011-08-16 ENCOUNTER — Other Ambulatory Visit (HOSPITAL_COMMUNITY)
Admission: RE | Admit: 2011-08-16 | Discharge: 2011-08-16 | Disposition: A | Discharge status: Home / Self Care | Payer: BC Managed Care – PPO | Source: Ambulatory Visit | Attending: Family Medicine | Admitting: Family Medicine

## 2011-08-16 VITALS — BP 116/77 | HR 104 | Temp 98.8°F | Ht 64.0 in | Wt 182.8 lb

## 2011-08-16 DIAGNOSIS — F341 Dysthymic disorder: Secondary | ICD-10-CM

## 2011-08-16 DIAGNOSIS — M533 Sacrococcygeal disorders, not elsewhere classified: Secondary | ICD-10-CM

## 2011-08-16 DIAGNOSIS — N760 Acute vaginitis: Secondary | ICD-10-CM

## 2011-08-16 DIAGNOSIS — N390 Urinary tract infection, site not specified: Secondary | ICD-10-CM

## 2011-08-16 DIAGNOSIS — R300 Dysuria: Secondary | ICD-10-CM

## 2011-08-16 DIAGNOSIS — Z113 Encounter for screening for infections with a predominantly sexual mode of transmission: Secondary | ICD-10-CM | POA: Insufficient documentation

## 2011-08-16 DIAGNOSIS — F419 Anxiety disorder, unspecified: Secondary | ICD-10-CM

## 2011-08-16 DIAGNOSIS — F329 Major depressive disorder, single episode, unspecified: Secondary | ICD-10-CM

## 2011-08-16 LAB — POCT URINALYSIS DIPSTICK
Ketones, UA: NEGATIVE
Protein, UA: NEGATIVE
Spec Grav, UA: 1.02

## 2011-08-16 MED ORDER — HYDROCODONE-ACETAMINOPHEN 5-325 MG PO TABS: 2.0000 | ORAL_TABLET | Freq: Two times a day (BID) | ORAL | Status: DC | PRN

## 2011-08-16 MED ORDER — AMOXICILLIN-POT CLAVULANATE 875-125 MG PO TABS: 1.0000 | ORAL_TABLET | Freq: Two times a day (BID) | ORAL | Status: DC

## 2011-08-16 MED ORDER — ALPRAZOLAM 0.5 MG PO TABS: 0.5000 mg | ORAL_TABLET | Freq: Three times a day (TID) | ORAL | Status: DC | PRN

## 2011-08-16 NOTE — Patient Instructions (Signed)
Vaginitis Vaginitis is an infection. It causes soreness, swelling, and redness (inflammation) of the vagina. Many of these infections are sexually transmitted diseases (STDs). Having unprotected sex can cause further problems and complications such as:  Chronic pelvic pain.   Infertility.   Unwanted pregnancy.   Abortion.   Tubal pregnancy.   Infection passed on to the newborn.   Cancer.  CAUSES   Monilia. This is a yeast or fungus infection, not an STD.   Bacterial vaginosis. The normal balance of bacteria in the vagina is disrupted and is replaced by an overgrowth of certain bacteria.   Gonorrhea, chlamydia. These are bacterial infections that are STDs.   Vaginal sponges, diaphragms, and intrauterine devices.   Trichomoniasis. This is a STD infection caused by a parasite.   Viruses like herpes and human papillomavirus. Both are STDs.   Pregnancy.   Immunosuppression. This occurs with certain conditions such as HIV infection or cancer.   Using bubble bath.   Taking certain antibiotic medicines.   Sporadic recurrence can occur if you become sick.   Diabetes.   Steroids.   Allergic reaction. If you have an allergy to:   Douches.   Soaps.   Spermicides.   Condoms.   Scented tampons or vaginal sprays.  SYMPTOMS   Abnormal vaginal discharge.   Itching of the vagina.   Pain in the vagina.   Swelling of the vagina.  In some cases, there are no symptoms. TREATMENT  Treatment will vary depending on the type of infection.  Bacteria or trichomonas are usually treated with oral antibiotics and sometimes vaginal cream or suppositories.   Monilia vaginitis is usually treated with vaginal creams, suppositories, or oral antifungal pills.   Viral vaginitis has no cure. However, the symptoms of herpes (a viral vaginitis) can be treated to relieve the discomfort. Human papillomavirus has no symptoms. However, there are treatments for the diseases caused by human  papillomavirus.   With allergic vaginitis, you need to stop using the product that is causing the problem. Vaginal creams can be used to treat the symptoms.   When treating an STD, the sex partner should also be treated.  HOME CARE INSTRUCTIONS   Take all the medicines as directed by your caregiver.   Do not use scented tampons, soaps, or vaginal sprays.   Do not douche.   Tell your sex partner if you have a vaginal infection or an STD.   Do not have sexual intercourse until you have treated the vaginitis.   Practice safe sex by using condoms.  SEEK MEDICAL CARE IF:   You have abdominal pain.   Your symptoms get worse during treatment.  Document Released: 03/24/2007 Document Revised: 05/16/2011 Document Reviewed: 11/17/2008 [employer] Patient Information 2012 [employer]. 

## 2011-08-18 ENCOUNTER — Encounter: Payer: Self-pay | Admitting: Family Medicine

## 2011-08-18 DIAGNOSIS — N390 Urinary tract infection, site not specified: Secondary | ICD-10-CM | POA: Insufficient documentation
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
## 2011-08-18 NOTE — Assessment & Plan Note (Signed)
Push clear fluids and started on antibiotic
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
## 2011-08-19 LAB — URINE CULTURE

## 2011-08-19 MED ORDER — CIPROFLOXACIN HCL 500 MG PO TABS: 500.0000 mg | ORAL_TABLET | Freq: Two times a day (BID) | ORAL | Status: DC

## 2011-08-19 NOTE — Progress Notes (Signed)
Addended by: Court Joy on: 08/19/2011 05:17 PM   Modules accepted: Orders

## 2011-08-23 MED ORDER — METRONIDAZOLE 500 MG PO TABS: 500.0000 mg | ORAL_TABLET | Freq: Two times a day (BID) | ORAL | Status: DC

## 2011-08-23 NOTE — Progress Notes (Signed)
Addended by: Court Joy on: 08/23/2011 12:13 PM   Modules accepted: Orders

## 2011-08-30 ENCOUNTER — Telehealth: Payer: Self-pay | Admitting: *Deleted

## 2011-08-30 NOTE — Telephone Encounter (Signed)
.  left message to have patient return my call with pt mother

## 2011-08-30 NOTE — Telephone Encounter (Signed)
Message copied by Ovidio Kin on Fri Aug 30, 2011  4:52 PM ------      Message from: Court Joy      Created: Mon Aug 26, 2011 12:11 PM       Left message on patients cell phone to return my call

## 2011-09-04 ENCOUNTER — Other Ambulatory Visit: Payer: Self-pay

## 2011-09-04 DIAGNOSIS — M533 Sacrococcygeal disorders, not elsewhere classified: Secondary | ICD-10-CM

## 2011-09-04 MED ORDER — HYDROCODONE-ACETAMINOPHEN 5-325 MG PO TABS: 2.0000 | ORAL_TABLET | Freq: Two times a day (BID) | ORAL | Status: DC | PRN
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
## 2011-09-04 NOTE — Telephone Encounter (Signed)
Med printed and faxed.  

## 2011-09-04 NOTE — Telephone Encounter (Signed)
OK to refill Hydrocodone with same sig and number 40 without refills

## 2011-09-04 NOTE — Telephone Encounter (Signed)
Please advise Hydrocodone refill? Last refill was 08-16-11/ 40 tablets

## 2011-09-13 ENCOUNTER — Ambulatory Visit: Payer: BC Managed Care – PPO | Admitting: Family Medicine

## 2011-09-20 ENCOUNTER — Ambulatory Visit (INDEPENDENT_AMBULATORY_CARE_PROVIDER_SITE_OTHER): Payer: BC Managed Care – PPO | Admitting: Family Medicine

## 2011-09-20 ENCOUNTER — Encounter: Payer: Self-pay | Admitting: Family Medicine

## 2011-09-20 VITALS — BP 124/85 | HR 99 | Temp 99.7°F | Ht 64.0 in | Wt 176.0 lb

## 2011-09-20 DIAGNOSIS — N390 Urinary tract infection, site not specified: Secondary | ICD-10-CM

## 2011-09-20 DIAGNOSIS — R319 Hematuria, unspecified: Secondary | ICD-10-CM

## 2011-09-20 DIAGNOSIS — F341 Dysthymic disorder: Secondary | ICD-10-CM

## 2011-09-20 DIAGNOSIS — M533 Sacrococcygeal disorders, not elsewhere classified: Secondary | ICD-10-CM

## 2011-09-20 DIAGNOSIS — M549 Dorsalgia, unspecified: Secondary | ICD-10-CM

## 2011-09-20 DIAGNOSIS — F329 Major depressive disorder, single episode, unspecified: Secondary | ICD-10-CM

## 2011-09-20 LAB — POCT URINALYSIS DIPSTICK
Bilirubin, UA: NEGATIVE
Glucose, UA: NEGATIVE
Ketones, UA: NEGATIVE
Spec Grav, UA: 1.02
Urobilinogen, UA: 0.2

## 2011-09-20 MED ORDER — VENLAFAXINE HCL ER 150 MG PO CP24: 150.0000 mg | ORAL_CAPSULE | Freq: Every day | ORAL | Status: DC

## 2011-09-20 MED ORDER — HYDROCODONE-ACETAMINOPHEN 5-325 MG PO TABS: 2.0000 | ORAL_TABLET | Freq: Two times a day (BID) | ORAL | Status: DC | PRN

## 2011-09-20 MED ORDER — CIPROFLOXACIN HCL 500 MG PO TABS: 500.0000 mg | ORAL_TABLET | Freq: Two times a day (BID) | ORAL | Status: DC

## 2011-09-20 MED ORDER — ALPRAZOLAM 0.5 MG PO TABS: 0.5000 mg | ORAL_TABLET | Freq: Three times a day (TID) | ORAL | Status: DC | PRN

## 2011-09-20 NOTE — Patient Instructions (Signed)

## 2011-09-22 NOTE — Assessment & Plan Note (Signed)
Started on antibiotics and Pyridium, increase hydration and add cranberry

## 2011-09-22 NOTE — Assessment & Plan Note (Signed)
Refill given on pain meds today to use sparingly
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
## 2011-09-22 NOTE — Assessment & Plan Note (Signed)
Patient and mother agree she is doing notably better than she was prior to starting Effexor but still not where she hopes to be, she can consider increasing the Effexor but for now no changes

## 2011-09-23 LAB — URINE CULTURE: Colony Count: >=100000

## 2011-09-24 MED ORDER — AMOXICILLIN-POT CLAVULANATE 875-125 MG PO TABS: 1.0000 | ORAL_TABLET | Freq: Two times a day (BID) | ORAL | Status: DC

## 2011-09-24 NOTE — Progress Notes (Signed)
Addended by: Court Joy on: 09/24/2011 09:06 AM   Modules accepted: Orders

## 2011-10-07 ENCOUNTER — Other Ambulatory Visit: Payer: Self-pay

## 2011-10-07 DIAGNOSIS — F329 Major depressive disorder, single episode, unspecified: Secondary | ICD-10-CM

## 2011-10-07 MED ORDER — VENLAFAXINE HCL ER 150 MG PO CP24: 150.0000 mg | ORAL_CAPSULE | Freq: Every day | ORAL | Status: DC

## 2011-10-07 MED ORDER — DROSPIREN-ETH ESTRAD-LEVOMEFOL 3-0.02-0.451 MG PO TABS: 1.0000 | ORAL_TABLET | Freq: Every day | ORAL | Status: DC

## 2011-10-14 ENCOUNTER — Other Ambulatory Visit: Payer: Self-pay

## 2011-10-14 MED ORDER — DROSPIREN-ETH ESTRAD-LEVOMEFOL 3-0.02-0.451 MG PO TABS: 1.0000 | ORAL_TABLET | Freq: Every day | ORAL | Status: DC

## 2011-11-05 ENCOUNTER — Other Ambulatory Visit: Payer: Self-pay

## 2011-11-05 DIAGNOSIS — M533 Sacrococcygeal disorders, not elsewhere classified: Secondary | ICD-10-CM

## 2011-11-05 MED ORDER — HYDROCODONE-ACETAMINOPHEN 5-325 MG PO TABS: 2.0000 | ORAL_TABLET | Freq: Two times a day (BID) | ORAL | Status: DC | PRN

## 2011-11-05 NOTE — Telephone Encounter (Signed)
She can have refill of Hydrocodone, with same sig, same # and same # of rf

## 2011-11-05 NOTE — Telephone Encounter (Signed)
Please advise refill? Last RX wrote on 09-20-11 quantity 40 with 1 refill

## 2011-11-14 ENCOUNTER — Ambulatory Visit (INDEPENDENT_AMBULATORY_CARE_PROVIDER_SITE_OTHER): Payer: BC Managed Care – PPO | Admitting: Family Medicine

## 2011-11-14 ENCOUNTER — Encounter: Payer: Self-pay | Admitting: Family Medicine

## 2011-11-14 VITALS — BP 107/72 | HR 120 | Temp 98.0°F | Ht 64.0 in | Wt 167.1 lb

## 2011-11-14 DIAGNOSIS — R0602 Shortness of breath: Secondary | ICD-10-CM

## 2011-11-14 DIAGNOSIS — M533 Sacrococcygeal disorders, not elsewhere classified: Secondary | ICD-10-CM

## 2011-11-14 DIAGNOSIS — R319 Hematuria, unspecified: Secondary | ICD-10-CM

## 2011-11-14 DIAGNOSIS — D649 Anemia, unspecified: Secondary | ICD-10-CM | POA: Insufficient documentation

## 2011-11-14 DIAGNOSIS — R000 Tachycardia, unspecified: Secondary | ICD-10-CM | POA: Insufficient documentation

## 2011-11-14 DIAGNOSIS — N390 Urinary tract infection, site not specified: Secondary | ICD-10-CM

## 2011-11-14 DIAGNOSIS — E876 Hypokalemia: Secondary | ICD-10-CM | POA: Insufficient documentation

## 2011-11-14 DIAGNOSIS — M549 Dorsalgia, unspecified: Secondary | ICD-10-CM

## 2011-11-14 HISTORY — DX: Tachycardia, unspecified: R00.0

## 2011-11-14 HISTORY — DX: Anemia, unspecified: D64.9

## 2011-11-14 HISTORY — DX: Hypokalemia: E87.6

## 2011-11-14 LAB — POCT URINALYSIS DIPSTICK
Bilirubin, UA: NEGATIVE
Ketones, UA: NEGATIVE
Spec Grav, UA: 1.015
pH, UA: 6.5

## 2011-11-14 LAB — CBC
Hemoglobin: 13.2 g/dL (ref 12.0–15.0)
MCV: 89.6 fl (ref 78.0–100.0)
Platelets: 364 10*3/uL (ref 150.0–400.0)
RBC: 4.49 Mil/uL (ref 3.87–5.11)

## 2011-11-14 LAB — RENAL FUNCTION PANEL
CO2: 25 mEq/L (ref 19–32)
Calcium: 9.6 mg/dL (ref 8.4–10.5)
Chloride: 106 mEq/L (ref 96–112)
Creatinine, Ser: 0.9 mg/dL (ref 0.4–1.2)
GFR: 90.05 mL/min (ref >60.00)
Potassium: 4 mEq/L (ref 3.5–5.1)
Sodium: 140 mEq/L (ref 135–145)

## 2011-11-14 LAB — TSH: TSH: 1.07 u[IU]/mL (ref 0.35–5.50)

## 2011-11-14 LAB — MAGNESIUM: Magnesium: 1.9 mg/dL (ref 1.5–2.5)

## 2011-11-14 MED ORDER — MELOXICAM 15 MG PO TABS: 15.0000 mg | ORAL_TABLET | Freq: Every day | ORAL | Status: DC | PRN

## 2011-11-14 MED ORDER — CARISOPRODOL 350 MG PO TABS: 350.0000 mg | ORAL_TABLET | Freq: Four times a day (QID) | ORAL | Status: DC | PRN

## 2011-11-14 MED ORDER — HYDROCODONE-ACETAMINOPHEN 7.5-325 MG PO TABS: 1.0000 | ORAL_TABLET | Freq: Every evening | ORAL | Status: DC | PRN

## 2011-11-14 NOTE — Assessment & Plan Note (Signed)
Is struggling with low back and b/l lower extremity discomfort for past week. Has had trouble sleeping and getting comfortable. Is in the middle of her menstrual cycle which makes things worse. Flexeril caused excess sedation so we will stop this and try Soma qhs. Also allowed 1 Norco 7.5 mg to use qhs as needed for severe pain. Asked to take Meloxicam 15 mg daily

## 2011-11-14 NOTE — Assessment & Plan Note (Signed)
Mild noted with last blood draw will repeat panel today

## 2011-11-14 NOTE — Assessment & Plan Note (Signed)
She was 120 on arrival and still 120 during exam when rechecked after 20 minutes of rest. She reports a long history of these episodes. They occur nearly daily at this time and can last 1-2 hours. Will sometimes cause SOB and fatigue. She has quit caffeine and does not smoke. Labs including thyroid and magnesium are run today and she is referred to cardiology for further evaluation today.

## 2011-11-14 NOTE — Assessment & Plan Note (Signed)
Mild, will repeat CBC today 

## 2011-11-14 NOTE — Assessment & Plan Note (Signed)
Urinary frequency noted, repeat UA and c&s

## 2011-11-14 NOTE — Progress Notes (Signed)
Patient ID: Kathryn Randall, female   DOB: 1990/09/03, 21 y.o.   MRN: [id number] KIESHA ENSEY [id number] [birth year] 11/14/2011      Progress Note-Follow Up  Subjective  Chief Complaint  Chief Complaint  Patient presents with  . Back Pain    X 1 week    HPI  Patient is a 21 year old Caucasian female who is in today complaining of increased low back pain. She struggled with intermittent low back pain and acknowledges it is always worse during her menstrual cycle which is on right now. Unfortunately the pain is bad possible low back and radiating around to both lower quadrants. She has pain radiating down both legs at times. For the past week the symptoms have been worsening despite no acute injury. Is having difficulty getting comfortable sleeping at night. She's also noting some urinary frequency and urgency. Denies dysuria or hematuria. Denies fevers, chills. Denies diffuse myalgias. Is tachycardic upon arrival and reports that episodes of feeling tachycardic and short of breath and fatigue nearly every day at this point says these episodes date back years. She quit BEEN trying to manage the symptoms. He appear happen randomly. He denies chest pain or common pattern of stressful activity etc. when they began.  Past Medical History  Diagnosis Date  . Anxiety   . Depression   . Back pain   . Migraine 06/28/2011  . Allergic state 06/28/2011  . Preventative health care 06/28/2011  . Anxiety and depression 07/17/2011  . UTI (lower urinary tract infection) 08/18/2011  . Tachycardia 11/14/2011  . Anemia 11/14/2011    Past Surgical History  Procedure Date  . Wisdom tooth extraction 21 yrs old    Family History  Problem Relation Age of Onset  . Migraines Mother   . Migraines Father   . Obesity Maternal Grandmother   . Heart disease Maternal Grandmother     CHF  . Hypertension Maternal Grandmother   . Diabetes Maternal Grandfather     type 2  . Obesity Maternal Grandfather   .  Leukemia Paternal Grandfather   . Migraines Paternal Grandfather     History   Social History  . Marital Status: Single    Spouse Name: N/A    Number of Children: N/A  . Years of Education: N/A   Occupational History  . Not on file.   Social History Main Topics  . Smoking status: Never Smoker   . Smokeless tobacco: Never Used   Comment: black and milds every once in awhile  . Alcohol Use: No  . Drug Use: No  . Sexually Active: Not Currently -- Female partner(s)   Other Topics Concern  . Not on file   Social History Narrative  . No narrative on file    Current Outpatient Prescriptions on File Prior to Visit  Medication Sig Dispense Refill  . ALPRAZolam (XANAX) 0.5 MG tablet Take 1 tablet (0.5 mg total) by mouth 3 (three) times daily as needed for sleep or anxiety.  70 tablet  1  . Drospirenone-Ethinyl Estradiol-Levomefol (BEYAZ) 3-0.02-0.451 MG tablet Take 1 tablet by mouth daily.  3 Package  2  . HYDROcodone-acetaminophen (NORCO) 5-325 MG per tablet Take 2 tablets by mouth 2 (two) times daily as needed for pain.  40 tablet  1  . venlafaxine XR (EFFEXOR-XR) 150 MG 24 hr capsule Take 1 capsule (150 mg total) by mouth daily.  90 capsule  2    Allergies  Allergen Reactions  . Ibuprofen  Racing heart  . Morphine And Related Itching    Review of Systems  Review of Systems  Constitutional: Negative for fever and malaise/fatigue.  HENT: Negative for congestion.   Eyes: Negative for pain and discharge.  Respiratory: Positive for shortness of breath.   Cardiovascular: Positive for palpitations. Negative for chest pain and leg swelling.  Gastrointestinal: Negative for nausea, abdominal pain and diarrhea.  Genitourinary: Positive for urgency, frequency and flank pain. Negative for dysuria and hematuria.  Musculoskeletal: Positive for myalgias and back pain. Negative for falls.  Skin: Negative for rash.  Neurological: Negative for loss of consciousness and headaches.    Endo/Heme/Allergies: Negative for polydipsia.  Psychiatric/Behavioral: Negative for depression and suicidal ideas. The patient is not nervous/anxious and does not have insomnia.     Objective  BP 107/72  Pulse 120  Temp(Src) 98 F (36.7 C) (Temporal)  Ht 5\' 4"  (1.626 m)  Wt 167 lb 1.9 oz (75.805 kg)  BMI 28.69 kg/m2  SpO2 97%  LMP 11/11/2011  Physical Exam  Physical Exam  Constitutional: She is oriented to person, place, and time and well-developed, well-nourished, and in no distress. No distress.  HENT:  Head: Normocephalic and atraumatic.  Eyes: Conjunctivae are normal.  Neck: Neck supple. No thyromegaly present.  Cardiovascular: Normal rate, regular rhythm and normal heart sounds.   No murmur heard. Pulmonary/Chest: Effort normal and breath sounds normal. She has no wheezes.  Abdominal: She exhibits no distension and no mass.  Musculoskeletal: She exhibits no edema.  Lymphadenopathy:    She has no cervical adenopathy.  Neurological: She is alert and oriented to person, place, and time.  Skin: Skin is warm and dry. No rash noted. She is not diaphoretic.  Psychiatric: Memory, affect and judgment normal.    Lab Results  Component Value Date   TSH 1.61 07/17/2011   Lab Results  Component Value Date   WBC 7.7 07/17/2011   HGB 11.9* 07/17/2011   HCT 35.8* 07/17/2011   MCV 89.4 07/17/2011   PLT 336.0 07/17/2011   Lab Results  Component Value Date   CREATININE 0.8 07/17/2011   BUN 8 07/17/2011   NA 139 07/17/2011   K 3.4* 07/17/2011   CL 103 07/17/2011   CO2 25 07/17/2011   Lab Results  Component Value Date   ALT 13 07/17/2011   AST 18 07/17/2011   ALKPHOS 62 07/17/2011   BILITOT 0.2* 07/17/2011   Lab Results  Component Value Date   CHOL 163 07/17/2011   Lab Results  Component Value Date   HDL 81.90 07/17/2011   Lab Results  Component Value Date   LDLCALC 53 07/17/2011   Lab Results  Component Value Date   TRIG 140.0 07/17/2011   Lab Results  Component Value Date   CHOLHDL 2  07/17/2011     Assessment & Plan  Tachycardia She was 120 on arrival and still 120 during exam when rechecked after 20 minutes of rest. She reports a long history of these episodes. They occur nearly daily at this time and can last 1-2 hours. Will sometimes cause SOB and fatigue. She has quit caffeine and does not smoke. Labs including thyroid and magnesium are run today and she is referred to cardiology for further evaluation today.  Anemia Mild, will repeat CBC today  Back pain Is struggling with low back and b/l lower extremity discomfort for past week. Has had trouble sleeping and getting comfortable. Is in the middle of her menstrual cycle which makes things worse.  Flexeril caused excess sedation so we will stop this and try Soma qhs. Also allowed 1 Norco 7.5 mg to use qhs as needed for severe pain. Asked to take Meloxicam 15 mg daily  UTI (lower urinary tract infection) Urinary frequency noted, repeat UA and c&s  Hypokalemia Mild noted with last blood draw will repeat panel today

## 2011-11-14 NOTE — Patient Instructions (Signed)
Tachycardia, Nonspecific In adults, the heart normally beats between 60 and 100 times a minute. A heart rate over 100 is called tachycardia. When your heart beats too fast, it may not be able to pump enough blood to the rest of the body. CAUSES   Exercise or exertion.   Fever.   Pain or injury.   Infection.   Loss of fluid (dehydration).   Overactive thyroid.   Lack of red blood cells (anemia).   Anxiety.   Alcohol.   Heart arrhythmia.   Caffeine.   Tobacco products.   Diet pills.   Street drugs.   Heart disease.  SYMPTOMS  Palpitations (rapid or irregular heartbeat).   Dizziness.   Tiredness (fatigue).   Shortness of breath.  DIAGNOSIS  After an exam and taking a history, your caregiver may order:  Blood tests.   Electrocardiogram (EKG).   Heart monitor.  TREATMENT  Treatment will depend on the cause and potential for harm. It may include:  Intravenous (IV) replacement of fluids or blood.   Antidote or reversal medicines.   Changes in your present medicines.   Lifestyle changes.  HOME CARE INSTRUCTIONS   Get rest.   Drink enough water and fluids to keep your urine clear or pale yellow.   Avoid:   Caffeine.   Nicotine.   Alcohol.   Stress.   Chocolate.   Stimulants.   Only take medicine as directed by your caregiver.  SEEK IMMEDIATE MEDICAL CARE IF:   You have pain in your chest, upper arms, jaw, or neck.   You become weak, dizzy, or feel faint.   You have palpitations that will not go away.   You throw up (vomit), have diarrhea, or pass blood.   You look pale and your skin is cool and wet.  MAKE SURE YOU:   Understand these instructions.   Will watch your condition.   Will get help right away if you are not doing well or get worse.  Document Released: 07/04/2004 Document Revised: 05/16/2011 Document Reviewed: 05/07/2011 [facility] Patient Information 2012 [place], [place].Back Pain, Adult Low back pain is very common.  About 1 in 5 people have back pain.The cause of low back pain is rarely dangerous. The pain often gets better over time.About half of people with a sudden onset of back pain feel better in just 2 weeks. About 8 in 10 people feel better by 6 weeks.  CAUSES Some common causes of back pain include:  Strain of the muscles or ligaments supporting the spine.   Wear and tear (degeneration) of the spinal discs.   Arthritis.   Direct injury to the back.  DIAGNOSIS Most of the time, the direct cause of low back pain is not known.However, back pain can be treated effectively even when the exact cause of the pain is unknown.Answering your caregiver's questions about your overall health and symptoms is one of the most accurate ways to make sure the cause of your pain is not dangerous. If your caregiver needs more information, he or she may order lab work or imaging tests (X-rays or MRIs).However, even if imaging tests show changes in your back, this usually does not require surgery. HOME CARE INSTRUCTIONS For many people, back pain returns.Since low back pain is rarely dangerous, it is often a condition that people can learn to [facility] their own.   Remain active. It is stressful on the back to sit or stand in one place. Do not sit, drive, or stand in one place  for more than 30 minutes at a time. Take short walks on level surfaces as soon as pain allows.Try to increase the length of time you walk each day.   Do not stay in bed.Resting more than 1 or 2 days can delay your recovery.   Do not avoid exercise or work.Your body is made to move.It is not dangerous to be active, even though your back may hurt.Your back will likely heal faster if you return to being active before your pain is gone.   Pay attention to your body when you bend and lift. Many people have less discomfortwhen lifting if they bend their knees, keep the load close to their bodies,and avoid twisting. Often, the most  comfortable positions are those that put less stress on your recovering back.   Find a comfortable position to sleep. Use a firm mattress and lie on your side with your knees slightly bent. If you lie on your back, put a pillow under your knees.   Only take over-the-counter or prescription medicines as directed by your caregiver. Over-the-counter medicines to reduce pain and inflammation are often the most helpful.Your caregiver may prescribe muscle relaxant drugs.These medicines help dull your pain so you can more quickly return to your normal activities and healthy exercise.   Put ice on the injured area.   Put ice in a plastic bag.   Place a towel between your skin and the bag.   Leave the ice on for 15 to 20 minutes, 3 to 4 times a day for the first 2 to 3 days. After that, ice and heat may be alternated to reduce pain and spasms.   Ask your caregiver about trying back exercises and gentle massage. This may be of some benefit.   Avoid feeling anxious or stressed.Stress increases muscle tension and can worsen back pain.It is important to recognize when you are anxious or stressed and learn ways to manage it.Exercise is a great option.  SEEK MEDICAL CARE IF:  You have pain that is not relieved with rest or medicine.   You have pain that does not improve in 1 week.   You have new symptoms.   You are generally not feeling well.  SEEK IMMEDIATE MEDICAL CARE IF:   You have pain that radiates from your back into your legs.   You develop new bowel or bladder control problems.   You have unusual weakness or numbness in your arms or legs.   You develop nausea or vomiting.   You develop abdominal pain.   You feel faint.  Document Released: 05/27/2005 Document Revised: 05/16/2011 Document Reviewed: 10/15/2010 [facility] Patient Information 2012 [place], [place].

## 2011-11-17 LAB — URINE CULTURE: Colony Count: >=100000

## 2011-11-18 ENCOUNTER — Other Ambulatory Visit: Payer: Self-pay

## 2011-11-18 DIAGNOSIS — F419 Anxiety disorder, unspecified: Secondary | ICD-10-CM

## 2011-11-18 MED ORDER — CEFDINIR 300 MG PO CAPS: 300.0000 mg | ORAL_CAPSULE | Freq: Two times a day (BID) | ORAL | Status: DC

## 2011-11-18 MED ORDER — ALPRAZOLAM 0.5 MG PO TABS: 0.5000 mg | ORAL_TABLET | Freq: Three times a day (TID) | ORAL | Status: DC | PRN

## 2011-11-18 NOTE — Progress Notes (Signed)
Addended by: Court Joy on: 11/18/2011 02:01 PM   Modules accepted: Orders

## 2011-11-18 NOTE — Telephone Encounter (Signed)
RX faxed to pharmacy.

## 2011-11-18 NOTE — Telephone Encounter (Signed)
Please advise refill? Last RX wrote on 09-09-10 quantity 70 with 1 refill.

## 2011-11-28 ENCOUNTER — Other Ambulatory Visit: Payer: Self-pay

## 2011-11-28 DIAGNOSIS — M533 Sacrococcygeal disorders, not elsewhere classified: Secondary | ICD-10-CM

## 2011-11-28 NOTE — Telephone Encounter (Signed)
Left a message for patient to return my call. We received paperwork from [facility] stating that pt would like 90 day RX of Venlafaxine sent to [facility]. Want to verify with pt that RX needs to go to [facility]

## 2011-12-04 MED ORDER — HYDROCODONE-ACETAMINOPHEN 5-325 MG PO TABS: 2.0000 | ORAL_TABLET | Freq: Two times a day (BID) | ORAL | Status: DC | PRN

## 2011-12-04 NOTE — Telephone Encounter (Signed)
Pt doesn't want the effexor sent to [facility]. Pt doesn't need a refill.   We did however receive a refill request for Hydrocodone. Please advise refill? Last RX wrote on 11-05-11 quantity 40 with 1 refill.  Per MD can refill quantity 40 with 0 refills. If pt runs out before appt she will need to schedule appt for more refills.  Will print and fax

## 2011-12-09 ENCOUNTER — Other Ambulatory Visit: Payer: Self-pay

## 2011-12-09 DIAGNOSIS — F419 Anxiety disorder, unspecified: Secondary | ICD-10-CM

## 2011-12-09 MED ORDER — VENLAFAXINE HCL ER 150 MG PO CP24: 150.0000 mg | ORAL_CAPSULE | Freq: Every day | ORAL | Status: DC

## 2011-12-11 ENCOUNTER — Ambulatory Visit (INDEPENDENT_AMBULATORY_CARE_PROVIDER_SITE_OTHER): Payer: BC Managed Care – PPO | Admitting: Family Medicine

## 2011-12-11 ENCOUNTER — Encounter: Payer: Self-pay | Admitting: Family Medicine

## 2011-12-11 VITALS — BP 98/67 | HR 93 | Temp 96.5°F | Ht 64.0 in | Wt 172.0 lb

## 2011-12-11 DIAGNOSIS — D649 Anemia, unspecified: Secondary | ICD-10-CM

## 2011-12-11 DIAGNOSIS — M549 Dorsalgia, unspecified: Secondary | ICD-10-CM

## 2011-12-11 DIAGNOSIS — M533 Sacrococcygeal disorders, not elsewhere classified: Secondary | ICD-10-CM

## 2011-12-11 DIAGNOSIS — N390 Urinary tract infection, site not specified: Secondary | ICD-10-CM

## 2011-12-11 DIAGNOSIS — F341 Dysthymic disorder: Secondary | ICD-10-CM

## 2011-12-11 DIAGNOSIS — R300 Dysuria: Secondary | ICD-10-CM

## 2011-12-11 DIAGNOSIS — R000 Tachycardia, unspecified: Secondary | ICD-10-CM

## 2011-12-11 DIAGNOSIS — Z309 Encounter for contraceptive management, unspecified: Secondary | ICD-10-CM

## 2011-12-11 DIAGNOSIS — F419 Anxiety disorder, unspecified: Secondary | ICD-10-CM

## 2011-12-11 DIAGNOSIS — R319 Hematuria, unspecified: Secondary | ICD-10-CM

## 2011-12-11 DIAGNOSIS — R112 Nausea with vomiting, unspecified: Secondary | ICD-10-CM

## 2011-12-11 LAB — POCT URINALYSIS DIPSTICK
Bilirubin, UA: NEGATIVE
Glucose, UA: NEGATIVE
Nitrite, UA: POSITIVE
Urobilinogen, UA: 0.2

## 2011-12-11 MED ORDER — CARISOPRODOL 350 MG PO TABS: 350.0000 mg | ORAL_TABLET | Freq: Three times a day (TID) | ORAL | Status: DC | PRN

## 2011-12-11 MED ORDER — HYDROCODONE-ACETAMINOPHEN 7.5-325 MG PO TABS: 1.0000 | ORAL_TABLET | Freq: Two times a day (BID) | ORAL | Status: DC | PRN

## 2011-12-11 MED ORDER — PROMETHAZINE HCL 25 MG PO TABS: 25.0000 mg | ORAL_TABLET | Freq: Three times a day (TID) | ORAL | Status: DC | PRN

## 2011-12-11 MED ORDER — NITROFURANTOIN MONOHYD MACRO 100 MG PO CAPS: 100.0000 mg | ORAL_CAPSULE | Freq: Two times a day (BID) | ORAL | Status: AC

## 2011-12-11 NOTE — Assessment & Plan Note (Signed)
Patient reports significant worsening each month for the couple of weeks around her cycles. She has just started Meloxicam and feels it helps some, she notes 1 hydrocodone APAP 7.5/325 works better without sedation than 2 of the 5/325s so we will change her prescription for now. She has previously had a work up by gyn with vaginal [place] etc which she reports was negative for endometriosis etc. This was roughly 2 years ago. If symptoms continue to worsen she may require a new GYN evaluation

## 2011-12-11 NOTE — Patient Instructions (Addendum)
Dysmenorrhea Menstrual pain is caused by the muscles of the uterus tightening (contracting) during a menstrual period. The muscles of the uterus contract due to the chemicals in the uterine lining. Primary dysmenorrhea is menstrual cramps that last a couple of days when you start having menstrual periods or soon after. This often begins after a teenager starts having her period. As a woman gets older or has a baby, the cramps will usually lesson or disappear. Secondary dysmenorrhea begins later in life, lasts longer, and the pain may be stronger than primary dysmenorrhea. The pain may start before the period and last a few days after the period. This type of dysmenorrhea is usually caused by an underlying problem such as:  The tissue lining the uterus grows outside of the uterus in other areas of the body (endometriosis).   The endometrial tissue, which normally lines the uterus, is found in or grows into the muscular walls of the uterus (adenomyosis).   The pelvic blood vessels are engorged with blood just before the menstrual period (pelvic congestive syndrome).   Overgrowth of cells in the lining of the uterus or cervix (polyps of the uterus or cervix).   Falling down of the uterus (prolapse) because of loose or stretched ligaments.   Depression.   Bladder problems, infection, or inflammation.   Problems with the intestine, a tumor, or irritable bowel syndrome.   Cancer of the female organs or bladder.   A severely tipped uterus.   A very tight opening or closed cervix.   Noncancerous tumors of the uterus (fibroids).   Pelvic inflammatory disease (PID).   Pelvic scarring (adhesions) from a previous surgery.   Ovarian cyst.   An intrauterine device (IUD) used for birth control.  CAUSES  The cause of menstrual pain is often unknown. SYMPTOMS   Cramping or throbbing pain in your lower abdomen.   Sometimes, a woman may also experience headaches.   Lower back pain.    Feeling sick to your stomach (nausea) or vomiting.   Diarrhea.   Sweating or dizziness.  DIAGNOSIS  A diagnosis is based on your history, symptoms, physical examination, diagnostic tests, or procedures. Diagnostic tests or procedures may include:  Blood tests.   An ultrasound.   An examination of the lining of the uterus (dilation and curettage, D&C).   An examination inside your abdomen or pelvis with a scope (laparoscopy).   X-rays.   CT Scan.   MRI.   An examination inside the bladder with a scope (cystoscopy).   An examination inside the intestine or stomach with a scope (colonoscopy, gastroscopy).  TREATMENT  Treatment depends on the cause of the dysmenorrhea. Treatment may include:  Pain medicine prescribed by your caregiver.   Birth control pills.   Hormone replacement therapy.   Nonsteroidal anti-inflammatory drugs (NSAIDs). These may help stop the production of prostaglandins.   An IUD with progesterone hormone in it.   Acupuncture.   Surgery to remove adhesions, endometriosis, ovarian cyst, or fibroids.   Removal of the uterus (hysterectomy).   Progesterone shots to stop the menstrual period.   Cutting the nerves on the sacrum that go to the female organs (presacral neurectomy).   Electric currant to the sacral nerves (sacral nerve stimulation).   Antidepressant medicine.   Psychiatric therapy, counseling, or group therapy.   Exercise and physical therapy.   Meditation and yoga therapy.  HOME CARE INSTRUCTIONS   Only take over-the-counter or prescription medicines for pain, discomfort, or fever as directed by your   caregiver.   Place a heating pad or hot water bottle on your lower back or abdomen. Do not sleep with the heating pad.   Use aerobic exercises, walking, swimming, biking, and other exercises to help lessen the cramping.   Massage to the lower back or abdomen may help.   Stop smoking.   Avoid alcohol and caffeine.   Yoga,  meditation, or acupuncture may help.  SEEK MEDICAL CARE IF:   The pain does not get better with medicine.   You have pain with sexual intercourse.  SEEK IMMEDIATE MEDICAL CARE IF:   Your pain increases and is not controlled with medicines.   You have a fever.   You develop nausea or vomiting with your period not controlled with medicine.   You have abnormal vaginal bleeding with your period.   You pass out.  MAKE SURE YOU:   Understand these instructions.   Will watch your condition.   Will get help right away if you are not doing well or get worse.  Document Released: 05/27/2005 Document Revised: 05/16/2011 Document Reviewed: 09/12/2008 [employer] Patient Information 2012 [employer]. 

## 2011-12-11 NOTE — Assessment & Plan Note (Addendum)
Struggling with dysuria and frequency again, encouraged increased fluids, cranberry tabs and probiotics. Started on Macrodantin bid

## 2011-12-11 NOTE — Assessment & Plan Note (Addendum)
Has appt with cardiology next week and continues to struggle with palpitations and persistent tachycardia. She is strongly encouraged to avoid all caffeine until seen by cardiology. She has cut back significantly but not quit completely

## 2011-12-11 NOTE — Progress Notes (Signed)
Patient ID: Kathryn Randall, female   DOB: 03/16/1991, 21 y.o.   MRN: [id number] AARIA HAPP [id number] 1990/11/07 12/11/2011      Progress Note-Follow Up  Subjective  Chief Complaint  Chief Complaint  Patient presents with  . Follow-up    back pain, getting worse    HPI  Patient is a 21 year old Caucasian female who is in today for followup of multiple medical problems. She is reporting persistent low back pain and hip pain. She notes the pain worsens in a couple which were around her cycle and wraps around to her bilateral lower quadrants. 2 hydrocodone APAP of the  05/325 variety cause excessive sedation. 1 of the 7.5/325 tab seems to help her manage the pain without excessive sedation. She does believe meloxicam helps somewhat the naproxen does but does not take it routinely she was advised. She also dealing with recurrence of dysuria and urinary frequency and urgency over the last several days. The dysuria is mild so far. No hematuria. No fevers chills. She is pleased with her venlafaxine overall feels she's doing better and is less anxious. Continues to struggle with episodes of tachycardia and has some discomfort as well as a sense of difficulty getting her breath when these episodes occur. No palpitations or chest pain noted in the office today. No recent congestion or respiratory illness noted  Past Medical History  Diagnosis Date  . Anxiety   . Depression   . Back pain   . Migraine 06/28/2011  . Allergic state 06/28/2011  . Preventative health care 06/28/2011  . Anxiety and depression 07/17/2011  . UTI (lower urinary tract infection) 08/18/2011  . Tachycardia 11/14/2011  . Anemia 11/14/2011  . Hypokalemia 11/14/2011    Past Surgical History  Procedure Date  . Wisdom tooth extraction 21 yrs old    Family History  Problem Relation Age of Onset  . Migraines Mother   . Migraines Father   . Obesity Maternal Grandmother   . Heart disease Maternal Grandmother     CHF  .  Hypertension Maternal Grandmother   . Diabetes Maternal Grandfather     type 2  . Obesity Maternal Grandfather   . Leukemia Paternal Grandfather   . Migraines Paternal Grandfather     History   Social History  . Marital Status: Single    Spouse Name: N/A    Number of Children: N/A  . Years of Education: N/A   Occupational History  . Not on file.   Social History Main Topics  . Smoking status: Never Smoker   . Smokeless tobacco: Never Used   Comment: black and milds every once in awhile  . Alcohol Use: No  . Drug Use: No  . Sexually Active: Not Currently -- Female partner(s)   Other Topics Concern  . Not on file   Social History Narrative  . No narrative on file    Current Outpatient Prescriptions on File Prior to Visit  Medication Sig Dispense Refill  . ALPRAZolam (XANAX) 0.5 MG tablet Take 1 tablet (0.5 mg total) by mouth 3 (three) times daily as needed for sleep or anxiety.  70 tablet  1  . Drospirenone-Ethinyl Estradiol-Levomefol (BEYAZ) 3-0.02-0.451 MG tablet Take 1 tablet by mouth daily.  3 Package  2  . meloxicam (MOBIC) 15 MG tablet Take 1 tablet (15 mg total) by mouth daily as needed for pain. With food  30 tablet  3  . venlafaxine XR (EFFEXOR-XR) 150 MG 24 hr capsule Take 1 capsule (  150 mg total) by mouth daily.  90 capsule  2  . DISCONTD: promethazine (PHENERGAN) 25 MG tablet Take 1 tablet (25 mg total) by mouth every 8 (eight) hours as needed for nausea.  20 tablet  0    Allergies  Allergen Reactions  . Ibuprofen     Racing heart  . Morphine And Related Itching    Review of Systems  Review of Systems  Constitutional: Negative for fever and malaise/fatigue.  HENT: Negative for congestion.   Eyes: Negative for discharge.  Respiratory: Negative for shortness of breath.   Cardiovascular: Negative for chest pain, palpitations and leg swelling.  Gastrointestinal: Positive for abdominal pain. Negative for nausea and diarrhea.  Genitourinary: Positive for  dysuria, urgency and frequency. Negative for hematuria.  Musculoskeletal: Positive for back pain. Negative for falls.  Skin: Negative for rash.  Neurological: Negative for loss of consciousness and headaches.  Endo/Heme/Allergies: Negative for polydipsia.  Psychiatric/Behavioral: Negative for depression and suicidal ideas. The patient is not nervous/anxious and does not have insomnia.      Objective  BP 98/67  Pulse 93  Temp 96.5 F (35.8 C) (Temporal)  Ht 5\' 4"  (1.626 m)  Wt 172 lb (78.019 kg)  BMI 29.52 kg/m2  SpO2 100%  LMP 11/11/2011  Physical Exam  Physical Exam  Constitutional: She is oriented to person, place, and time and well-developed, well-nourished, and in no distress. No distress.  HENT:  Head: Normocephalic and atraumatic.  Eyes: Conjunctivae are normal.  Neck: Neck supple. No thyromegaly present.  Cardiovascular: Normal rate, regular rhythm and normal heart sounds.   No murmur heard. Pulmonary/Chest: Effort normal and breath sounds normal. She has no wheezes.  Abdominal: She exhibits no distension and no mass.  Musculoskeletal: She exhibits no edema.  Lymphadenopathy:    She has no cervical adenopathy.  Neurological: She is alert and oriented to person, place, and time.  Skin: Skin is warm and dry. No rash noted. She is not diaphoretic.  Psychiatric: Memory, affect and judgment normal.    Lab Results  Component Value Date   TSH 1.07 11/14/2011   Lab Results  Component Value Date   WBC 8.0 11/14/2011   HGB 13.2 11/14/2011   HCT 40.3 11/14/2011   MCV 89.6 11/14/2011   PLT 364.0 11/14/2011   Lab Results  Component Value Date   CREATININE 0.9 11/14/2011   BUN 14 11/14/2011   NA 140 11/14/2011   K 4.0 11/14/2011   CL 106 11/14/2011   CO2 25 11/14/2011   Lab Results  Component Value Date   ALT 13 07/17/2011   AST 18 07/17/2011   ALKPHOS 62 07/17/2011   BILITOT 0.2* 07/17/2011   Lab Results  Component Value Date   CHOL 163 07/17/2011   Lab Results  Component Value Date    HDL 81.90 07/17/2011   Lab Results  Component Value Date   LDLCALC 53 07/17/2011   Lab Results  Component Value Date   TRIG 140.0 07/17/2011   Lab Results  Component Value Date   CHOLHDL 2 07/17/2011     Assessment & Plan  Tachycardia Has appt with cardiology next week and continues to struggle with palpitations and persistent tachycardia. She is strongly encouraged to avoid all caffeine until seen by cardiology. She has cut back significantly but not quit completely  UTI (lower urinary tract infection) Struggling with dysuria and frequency again, encouraged increased fluids, cranberry tabs and probiotics. Started on Macrodantin bid  Anxiety and depression Good response to  Venlafaxine no changes  Anemia Resolved with most recent blood draw  Contraceptive management Feels she tolerates the Beyaz better than anything else no changes in this regard.  Back pain Patient reports significant worsening each month for the couple of weeks around her cycles. She has just started Meloxicam and feels it helps some, she notes 1 hydrocodone APAP 7.5/325 works better without sedation than 2 of the 5/325s so we will change her prescription for now. She has previously had a work up by gyn with vaginal [place] etc which she reports was negative for endometriosis etc. This was roughly 2 years ago. If symptoms continue to worsen she may require a new GYN evaluation

## 2011-12-11 NOTE — Assessment & Plan Note (Signed)
Resolved with most recent blood draw 

## 2011-12-11 NOTE — Assessment & Plan Note (Signed)
Good response to Venlafaxine no changes

## 2011-12-11 NOTE — Assessment & Plan Note (Signed)
Feels she tolerates the Beyaz better than anything else no changes in this regard.

## 2011-12-11 NOTE — Addendum Note (Signed)
Addended by: Baldemar Lenis R on: 12/11/2011 02:41 PM   Modules accepted: Orders

## 2011-12-14 LAB — URINE CULTURE: Colony Count: >=100000

## 2011-12-16 ENCOUNTER — Ambulatory Visit: Payer: BC Managed Care – PPO | Admitting: Cardiovascular Disease

## 2011-12-19 ENCOUNTER — Other Ambulatory Visit: Payer: Self-pay | Admitting: *Deleted

## 2011-12-19 DIAGNOSIS — F419 Anxiety disorder, unspecified: Secondary | ICD-10-CM

## 2011-12-19 NOTE — Telephone Encounter (Signed)
Faxed refill request received from pharmacy for ALPRAZOLAM Last filled by MD on 11/18/11, #70 X 1 Last seen on 12/11/11 Follow up 02/11/12 Please advise refills.

## 2011-12-20 ENCOUNTER — Ambulatory Visit: Payer: BC Managed Care – PPO | Admitting: Family Medicine

## 2011-12-20 NOTE — Telephone Encounter (Signed)
Previous RX denied by Dr. Abner Greenspan due to too early.  2nd request received.  I spoke with pharmacy to see if patient was calling for refill or if this is an automatic refill reqeust.  Pharmacy states pt is requesting refill.  Pt has had filled on 09/25/11, 10/22/11, 11/18/11, and 12/05/11.  She received #70 each time which pharmacy considers a 23 day supply. Verbal per Dr. Abner Greenspan OK to fill #50 if needed and this has to last until visit within 2 weeks.  Advise pt that #70 is considered a 30 day supply per Dr.Blyth and if she needs more than 70 in 90 days she needs a face to face visit with Dr. Abner Greenspan the week of 7/22. I have attempted to contact this patient by phone with the following results: left message to return my call on answering machine (mobile).

## 2011-12-23 ENCOUNTER — Other Ambulatory Visit: Payer: Self-pay | Admitting: *Deleted

## 2011-12-23 DIAGNOSIS — F329 Major depressive disorder, single episode, unspecified: Secondary | ICD-10-CM

## 2011-12-23 NOTE — Telephone Encounter (Signed)
Faxed refill request received from pharmacy for alprazolam Last filled by MD on 11/18/11, #70 x 1 Last seen on 12/11/11 Per pharmacy pt has received #70 on 4/17, 5/14, 6/10, and 6/27 Per Dr. Abner Greenspan, pt may have #50 due to first contact being on Friday, 7/12 and we did not want her to not have medication over the weekend.  Pt needs office visit within two weeks to discuss the need for more meds.  Message left for patient to return call on 7/12.   RC from patient today: Pt states she is using more alprazolam due to being out of her daily antidepressant, Venlafaxine.  This was e-scribed to [facility] on 12/09/11.  Pt states she has med now, but has been out of med for two weeks.  [name] states she is completely out of alprazolam at this time.  I advised Martin that I had verbal OK to give her a small quantity, but she would need to come in.  Do you still want to see patient now that we know why she was using more the prescribed, and do you still  Only want to give #50?  Faxed refill request received from pharmacy for Hydrocodone 7.5/325 Last filled by MD on 12/09/11, #40 x 1, 1 bid Patient has been using 2-3 of these tablets daily due to increase back pain and migraines caused by "all the rain lately."  Per the pharmacy she is calling for early refill.  They have only dispensed #40 of this dose.  Pt was previously on 5-325 with sig 2 BID. Please advise if early refill OK.

## 2011-12-23 NOTE — Telephone Encounter (Signed)
He can have same sig #70 once without refill if she needs to have more again she needs to come in

## 2011-12-25 MED ORDER — ALPRAZOLAM 0.5 MG PO TABS: 0.5000 mg | ORAL_TABLET | Freq: Three times a day (TID) | ORAL | Status: DC | PRN

## 2011-12-25 NOTE — Telephone Encounter (Signed)
RF sent.

## 2012-01-14 ENCOUNTER — Other Ambulatory Visit: Payer: Self-pay

## 2012-01-14 NOTE — Telephone Encounter (Signed)
Pt has 2 refills. I spoke to pharmacist at [facility] and she states pt does have 2 refills left

## 2012-01-16 ENCOUNTER — Other Ambulatory Visit: Payer: Self-pay

## 2012-01-16 DIAGNOSIS — M533 Sacrococcygeal disorders, not elsewhere classified: Secondary | ICD-10-CM

## 2012-01-16 MED ORDER — HYDROCODONE-ACETAMINOPHEN 7.5-325 MG PO TABS: 1.0000 | ORAL_TABLET | Freq: Two times a day (BID) | ORAL | Status: DC | PRN

## 2012-01-16 NOTE — Telephone Encounter (Signed)
Please advise refill? Last rx wrote on 12-11-11 # 40 with 1 refill.  If ok fax to [phone]

## 2012-01-16 NOTE — Telephone Encounter (Signed)
RX faxed

## 2012-01-16 NOTE — Telephone Encounter (Signed)
OK she can have a refill with same sig, same number and 1 refill but has to come in for further exam if she wants another refill after that

## 2012-01-21 ENCOUNTER — Other Ambulatory Visit: Payer: Self-pay

## 2012-01-21 NOTE — Telephone Encounter (Signed)
Please advise refill? Last RX was wrote on 12-25-11 for quantity 70 and 0 refills. Pharmacy states RX was filled and picked up on 12-28-11.   Last office visit note (12-23-11) states pt will have to make an appt for any additional refills.  I left a message for patient to return my call.

## 2012-01-22 MED ORDER — DROSPIREN-ETH ESTRAD-LEVOMEFOL 3-0.02-0.451 MG PO TABS: 1.0000 | ORAL_TABLET | Freq: Every day | ORAL | Status: DC

## 2012-01-22 NOTE — Telephone Encounter (Signed)
Pt informed of needing an appt and an appt was made.  I also sent pts birth control to mail order per patients request.

## 2012-01-30 ENCOUNTER — Ambulatory Visit (INDEPENDENT_AMBULATORY_CARE_PROVIDER_SITE_OTHER): Payer: BC Managed Care – PPO | Admitting: Cardiovascular Disease

## 2012-01-30 ENCOUNTER — Encounter: Payer: Self-pay | Admitting: Cardiovascular Disease

## 2012-01-30 VITALS — BP 124/86 | HR 113 | Ht 64.0 in | Wt 182.0 lb

## 2012-01-30 DIAGNOSIS — R000 Tachycardia, unspecified: Secondary | ICD-10-CM

## 2012-01-30 MED ORDER — METOPROLOL TARTRATE 25 MG PO TABS: ORAL_TABLET | ORAL | Status: DC

## 2012-01-30 NOTE — Progress Notes (Signed)
Kathryn Randall Date of Birth  10-17-1990       [facility]    [place] [address]  [address] [place], [place]  [address]   [place], [place]  [address] [phone]     [phone]   Fax  [phone]    Fax [phone]  Problem List: 1. Sinus tachycardia  2.   History of Present Illness:  Kathryn Randall is a 21 yo with a hx of sinus tachycrdia.  She Regular skater. She works at [employer]. She states was much is 3-4 hours a day but it does not sound like it's aerobic speed skating. She's never had episodes of chest pain or shortness breath. She has had some occasional episodes of syncope and presyncope.  She's been told that her heart rate is has been fast for most of her life. Her pediatrician discovered it but thought that she would outgrow. She's had numerous thyroid checks and they have all been normal.  Current Outpatient Prescriptions on File Prior to Visit  Medication Sig Dispense Refill  . ALPRAZolam (XANAX) 0.5 MG tablet Take 1 tablet (0.5 mg total) by mouth 3 (three) times daily as needed for sleep or anxiety.  70 tablet  0  . Drospirenone-Ethinyl Estradiol-Levomefol (BEYAZ) 3-0.02-0.451 MG tablet Take 1 tablet by mouth daily.  3 Package  1  . HYDROcodone-acetaminophen (NORCO) 7.5-325 MG per tablet Take 1 tablet by mouth 2 (two) times daily as needed for pain.  40 tablet  1  . meloxicam (MOBIC) 15 MG tablet Take 1 tablet (15 mg total) by mouth daily as needed for pain. With food  30 tablet  3  . venlafaxine XR (EFFEXOR-XR) 150 MG 24 hr capsule Take 1 capsule (150 mg total) by mouth daily.  90 capsule  2  . promethazine (PHENERGAN) 25 MG tablet Take 1 tablet (25 mg total) by mouth every 8 (eight) hours as needed for nausea.  40 tablet  2    Allergies  Allergen Reactions  . Ibuprofen     Racing heart  . Morphine And Related Itching    Past Medical History  Diagnosis Date  . Anxiety   . Depression   . Back pain     . Migraine 06/28/2011  . Allergic state 06/28/2011  . Preventative health care 06/28/2011  . Anxiety and depression 07/17/2011  . UTI (lower urinary tract infection) 08/18/2011  . Tachycardia 11/14/2011  . Anemia 11/14/2011  . Hypokalemia 11/14/2011    Past Surgical History  Procedure Date  . Wisdom tooth extraction 21 yrs old    History  Smoking status  . Never Smoker   Smokeless tobacco  . Never Used  Comment: black and milds every once in awhile    History  Alcohol Use No    Family History  Problem Relation Age of Onset  . Migraines Mother   . Migraines Father   . Obesity Maternal Grandmother   . Heart disease Maternal Grandmother     CHF  . Hypertension Maternal Grandmother   . Diabetes Maternal Grandfather     type 2  . Obesity Maternal Grandfather   . Leukemia Paternal Grandfather   . Migraines Paternal Grandfather     Reviw of Systems:  Reviewed in the HPI.  All other systems are negative.  Physical Exam: Blood pressure 124/86, pulse 113, height 5\' 4"  (1.626 m), weight 182 lb (82.555 kg). General: Well developed, well nourished, in no acute distress.  Head: Normocephalic, atraumatic, sclera non-icteric, mucus membranes are moist,   Neck: Supple. Carotids are 2 + without bruits. No JVD  Lungs: Clear bilaterally to auscultation.  Heart: regular rate.  normal  S1 S2. No murmurs, gallops or rubs.  She is tachycardic.  Abdomen: Soft, non-tender, non-distended with normal bowel sounds. No hepatomegaly. No rebound/guarding. No masses.  Msk:  Strength and tone are normal  Extremities: No clubbing or cyanosis. No edema.  Distal pedal pulses are 2+ and equal bilaterally.  Neuro: Alert and oriented X 3. Moves all extremities spontaneously.  Psych:  Responds to questions appropriately with a normal affect.  ECG: January 30, 2012- sinus tachycardia at a rate of 113 beats a minute. She has no ST or T wave changes.  Assessment / Plan:

## 2012-01-30 NOTE — Patient Instructions (Addendum)
Your physician has requested that you have an echocardiogram. Echocardiography is a painless test that uses sound waves to create images of your heart. It provides your doctor with information about the size and shape of your heart and how well your heart's chambers and valves are working. This procedure takes approximately one hour. There are no restrictions for this procedure.  Your physician has recommended you make the following change in your medication:  START METOPROLOL 25 MG TABLET- TAKE 1/2 TABLET 12.5 MG TWICE DAILY 12 HOURS APART FOR 1 WEEK THEN IF BP/P READINGS OK YOU WILL GO TO 25 MG TWICE DAILY    Your physician recommends that you schedule a follow-up appointment in: 1 MONTH WITH Norma Fredrickson NP OR [facility]  Your physician recommends that you schedule a follow-up appointment in: 3 MONTHS WITH DR Elease Hashimoto  YOU ARE TO CALL OFFICE IN ONE WEEK WITH RECORDINGS OF YOUR BLOOD PRESSURE /PULSE  [phone], ASK FOR JODETTE RN

## 2012-01-30 NOTE — Assessment & Plan Note (Signed)
Kathryn Randall presents with sinus tachycardia.  Her TSH is normal. She does not seem to have any underlying infection or other underlying conditions at this point. 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  We'll get an echocardiogram to ensure that she does not have any structural heart disease. I would like to wait a couple of weeks it we can slow her heart rate down slightly. This will give [place] a better  echocardiogram image.  We'll start her on metoprolol 12.5 mg twice a day. She will call us back in one week with heart rate and blood pressure readings. If her blood pressure still adequate and if she is still tachycardic we will increase her metoprolol to 25 mg twice a day. We will have her see Lawson Fiscal in one month. If she is still tachycardic and still has a fairly well preserved blood pressure then we'll increase her metoprolol to 50 mg twice a day.    I told her that if she becomes pregnant that we will need to change her metoprolol to labetalol.  We also discussed the possibility that she may have an electrical reason for her tachycardia. At this point her EKG is fairly unremarkable except for the sinus tachycardia I don't think that she has a bypass tract at this point. She certainly does not have any evidence of WPW.  I've asked her to exercise on a regular basis. She states for several hours a day we have asked her to do more aerobic workouts. Some of this may be due to generalized deconditioning.

## 2012-01-31 ENCOUNTER — Ambulatory Visit (INDEPENDENT_AMBULATORY_CARE_PROVIDER_SITE_OTHER): Payer: BC Managed Care – PPO | Admitting: Family Medicine

## 2012-01-31 ENCOUNTER — Encounter: Payer: Self-pay | Admitting: Family Medicine

## 2012-01-31 VITALS — BP 116/76 | HR 90 | Temp 97.4°F | Ht 64.0 in | Wt 181.4 lb

## 2012-01-31 DIAGNOSIS — R000 Tachycardia, unspecified: Secondary | ICD-10-CM

## 2012-01-31 DIAGNOSIS — M533 Sacrococcygeal disorders, not elsewhere classified: Secondary | ICD-10-CM

## 2012-01-31 DIAGNOSIS — G43909 Migraine, unspecified, not intractable, without status migrainosus: Secondary | ICD-10-CM

## 2012-01-31 DIAGNOSIS — F341 Dysthymic disorder: Secondary | ICD-10-CM

## 2012-01-31 DIAGNOSIS — F329 Major depressive disorder, single episode, unspecified: Secondary | ICD-10-CM

## 2012-01-31 DIAGNOSIS — Z309 Encounter for contraceptive management, unspecified: Secondary | ICD-10-CM

## 2012-01-31 MED ORDER — CARISOPRODOL 350 MG PO TABS: 350.0000 mg | ORAL_TABLET | Freq: Three times a day (TID) | ORAL | Status: AC | PRN

## 2012-01-31 MED ORDER — HYDROCODONE-ACETAMINOPHEN 7.5-325 MG PO TABS: 1.0000 | ORAL_TABLET | Freq: Three times a day (TID) | ORAL | Status: DC | PRN

## 2012-01-31 MED ORDER — ALPRAZOLAM 0.5 MG PO TABS: 0.5000 mg | ORAL_TABLET | Freq: Three times a day (TID) | ORAL | Status: DC | PRN

## 2012-01-31 NOTE — Patient Instructions (Addendum)
Tachycardia, Nonspecific  In adults, the heart normally beats between 60 and 100 times a minute. A heart rate over 100 is called tachycardia. When your heart beats too fast, it may not be able to pump enough blood to the rest of the body.  CAUSES    Exercise or exertion.   Fever.   Pain or injury.   Infection.   Loss of fluid (dehydration).   Overactive thyroid.   Lack of red blood cells (anemia).   Anxiety.   Alcohol.   Heart arrhythmia.   Caffeine.   Tobacco products.   Diet pills.   Street drugs.   Heart disease.  SYMPTOMS   Palpitations (rapid or irregular heartbeat).   Dizziness.   Tiredness (fatigue).   Shortness of breath.  DIAGNOSIS   After an exam and taking a history, your caregiver may order:   Blood tests.   Electrocardiogram (EKG).   Heart monitor.  TREATMENT   Treatment will depend on the cause and potential for harm. It may include:   Intravenous (IV) replacement of fluids or blood.   Antidote or reversal medicines.   Changes in your present medicines.   Lifestyle changes.  HOME CARE INSTRUCTIONS    Get rest.   Drink enough water and fluids to keep your urine clear or pale yellow.   Avoid:   Caffeine.   Nicotine.   Alcohol.   Stress.   Chocolate.   Stimulants.   Only take medicine as directed by your caregiver.  SEEK IMMEDIATE MEDICAL CARE IF:    You have pain in your chest, upper arms, jaw, or neck.   You become weak, dizzy, or feel faint.   You have palpitations that will not go away.   You throw up (vomit), have diarrhea, or pass blood.   You look pale and your skin is cool and wet.  MAKE SURE YOU:    Understand these instructions.   Will watch your condition.   Will get help right away if you are not doing well or get worse.  Document Released: 07/04/2004 Document Revised: 05/16/2011 Document Reviewed: 05/07/2011  ExitCare Patient Information 2012 [employer].

## 2012-02-02 NOTE — Assessment & Plan Note (Signed)
Improved on BetaBlockade, is due for Echocardiogram soon and then will follow up with cardiology

## 2012-02-02 NOTE — Progress Notes (Signed)
Patient ID: Kathryn Randall, female   DOB: [birth year], 21 y.o.   MRN: [id number] ANYIA GIERKE [id number] [birth year] 02/02/2012      Progress Note-Follow Up  Subjective  Chief Complaint  Chief Complaint  Patient presents with  . Follow-up    on Alprazolam    HPI  Patient is a 21 year old Caucasian female who is into for followup. She is to follow with cardiology for palpitations and was recently started on beta blockade. She's still feels the palpitations but they're less intense and less concerning. She did have a recent headache but it is resolved at this time. No chest pain or shortness of breath. She missed some doses of IBS and has very bad menstrual cramps. Does use an extra hydrocodone but is no longer doing so. Otherwise she reports her anxiety and depression are mostly better although she has had some troubles with her family. She's been able to quit that was very stressful and is in a healthy relationship. No other acute illness or new complaints are noted.   Past Medical History  Diagnosis Date  . Anxiety   . Depression   . Back pain   . Migraine 06/28/2011  . Allergic state 06/28/2011  . Preventative health care 06/28/2011  . Anxiety and depression 07/17/2011  . UTI (lower urinary tract infection) 08/18/2011  . Tachycardia 11/14/2011  . Anemia 11/14/2011  . Hypokalemia 11/14/2011    Past Surgical History  Procedure Date  . Wisdom tooth extraction 21 yrs old    Family History  Problem Relation Age of Onset  . Migraines Mother   . Migraines Father   . Obesity Maternal Grandmother   . Heart disease Maternal Grandmother     CHF  . Hypertension Maternal Grandmother   . Diabetes Maternal Grandfather     type 2  . Obesity Maternal Grandfather   . Leukemia Paternal Grandfather   . Migraines Paternal Grandfather     History   Social History  . Marital Status: Single    Spouse Name: N/A    Number of Children: N/A  . Years of Education: N/A   Occupational  History  . Not on file.   Social History Main Topics  . Smoking status: Never Smoker   . Smokeless tobacco: Never Used   Comment: black and milds every once in awhile  . Alcohol Use: No  . Drug Use: No  . Sexually Active: Not Currently -- Female partner(s)   Other Topics Concern  . Not on file   Social History Narrative  . No narrative on file    Current Outpatient Prescriptions on File Prior to Visit  Medication Sig Dispense Refill  . Drospirenone-Ethinyl Estradiol-Levomefol (BEYAZ) 3-0.02-0.451 MG tablet Take 1 tablet by mouth daily.  3 Package  1  . meloxicam (MOBIC) 15 MG tablet Take 1 tablet (15 mg total) by mouth daily as needed for pain. With food  30 tablet  3  . metoprolol tartrate (LOPRESSOR) 25 MG tablet 12.5 MG ( HALF A TABLET) TWICE A DAY 12 HOURS APART FOR ONE WEEK THEN 25 MG TWICE A DAY 12 HOURS APART.  60 tablet  5  . promethazine (PHENERGAN) 25 MG tablet Take 25 mg by mouth every 6 (six) hours as needed.      . venlafaxine XR (EFFEXOR-XR) 150 MG 24 hr capsule Take 1 capsule (150 mg total) by mouth daily.  90 capsule  2    Allergies  Allergen Reactions  . Ibuprofen  Racing heart  . Morphine And Related Itching    Review of Systems  Review of Systems  Constitutional: Negative for fever and malaise/fatigue.  HENT: Negative for congestion.   Eyes: Negative for discharge.  Respiratory: Negative for shortness of breath.   Cardiovascular: Positive for palpitations. Negative for chest pain and leg swelling.  Gastrointestinal: Negative for nausea, abdominal pain and diarrhea.  Genitourinary: Negative for dysuria.  Musculoskeletal: Negative for falls.  Skin: Negative for rash.  Neurological: Negative for loss of consciousness and headaches.  Endo/Heme/Allergies: Negative for polydipsia.  Psychiatric/Behavioral: Negative for depression and suicidal ideas. The patient is not nervous/anxious and does not have insomnia.     Objective  BP 116/76  Pulse 90   Temp 97.4 F (36.3 C) (Temporal)  Ht 5\' 4"  (1.626 m)  Wt 181 lb 6.4 oz (82.283 kg)  BMI 31.14 kg/m2  SpO2 98%  Physical Exam  Physical Exam  Constitutional: She is oriented to person, place, and time and well-developed, well-nourished, and in no distress. No distress.  HENT:  Head: Normocephalic and atraumatic.  Eyes: Conjunctivae are normal.  Neck: Neck supple. No thyromegaly present.  Cardiovascular: Normal rate, regular rhythm and normal heart sounds.   No murmur heard. Pulmonary/Chest: Effort normal and breath sounds normal. She has no wheezes.  Abdominal: She exhibits no distension and no mass.  Musculoskeletal: She exhibits no edema.  Lymphadenopathy:    She has no cervical adenopathy.  Neurological: She is alert and oriented to person, place, and time.  Skin: Skin is warm and dry. No rash noted. She is not diaphoretic.  Psychiatric: Memory, affect and judgment normal.    Lab Results  Component Value Date   TSH 1.07 11/14/2011   Lab Results  Component Value Date   WBC 8.0 11/14/2011   HGB 13.2 11/14/2011   HCT 40.3 11/14/2011   MCV 89.6 11/14/2011   PLT 364.0 11/14/2011   Lab Results  Component Value Date   CREATININE 0.9 11/14/2011   BUN 14 11/14/2011   NA 140 11/14/2011   K 4.0 11/14/2011   CL 106 11/14/2011   CO2 25 11/14/2011   Lab Results  Component Value Date   ALT 13 07/17/2011   AST 18 07/17/2011   ALKPHOS 62 07/17/2011   BILITOT 0.2* 07/17/2011   Lab Results  Component Value Date   CHOL 163 07/17/2011   Lab Results  Component Value Date   HDL 81.90 07/17/2011   Lab Results  Component Value Date   LDLCALC 53 07/17/2011   Lab Results  Component Value Date   TRIG 140.0 07/17/2011   Lab Results  Component Value Date   CHOLHDL 2 07/17/2011     Assessment & Plan  Tachycardia Improved on BetaBlockade, is due for Echocardiogram soon and then will follow up with cardiology  Contraceptive management She missed some doses of her Concha Pyo due to it was not delivered by her  mail order insurance. She had increased menstrual cramps and she incrased her pain med use temporarily, she is allowed a one month temporary increase in her Hydrocodone but it will have to go back down to her previous use.  Anxiety and depression Doing much better in some ways but still struggling with her family. Has quit a job which was stressing her .  Migraine No recent events

## 2012-02-02 NOTE — Assessment & Plan Note (Signed)
She missed some doses of her Concha Pyo due to it was not delivered by her mail order insurance. She had increased menstrual cramps and she incrased her pain med use temporarily, she is allowed a one month temporary increase in her Hydrocodone but it will have to go back down to her previous use.

## 2012-02-02 NOTE — Assessment & Plan Note (Signed)
Doing much better in some ways but still struggling with her family. Has quit a job which was stressing her .

## 2012-02-02 NOTE — Assessment & Plan Note (Signed)
No recent events.

## 2012-02-05 ENCOUNTER — Other Ambulatory Visit (HOSPITAL_COMMUNITY): Payer: BC Managed Care – PPO

## 2012-02-07 IMAGING — US US TRANSVAGINAL NON-OB
1 series · 14 of 25 positions shown · non-contrast
Comparison: Prior pelvic ultrasound November 2005.

CLINICAL DATA: [AGE] patient.  Abnormal menses.  Irregular
bleeding.  LMP unknown.  The patient is on oral contraceptives
appear



[Series 1: us transvaginal non-ob · 14 of 41 slices shown]
[im 1/41]
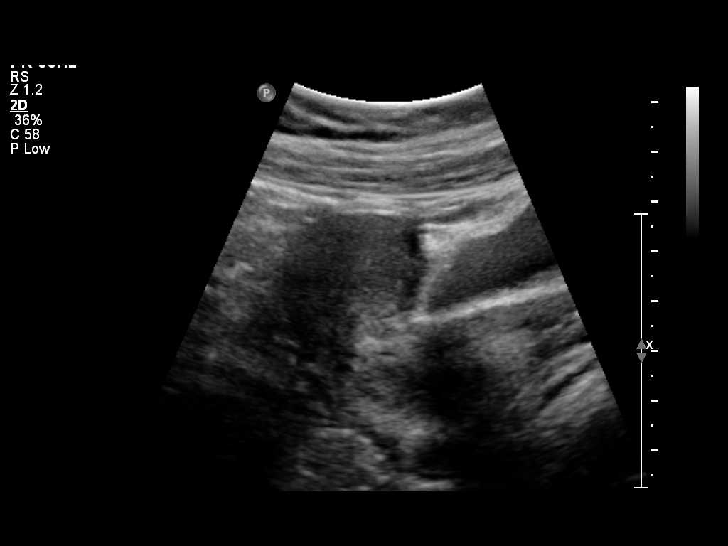
[im 4/41]
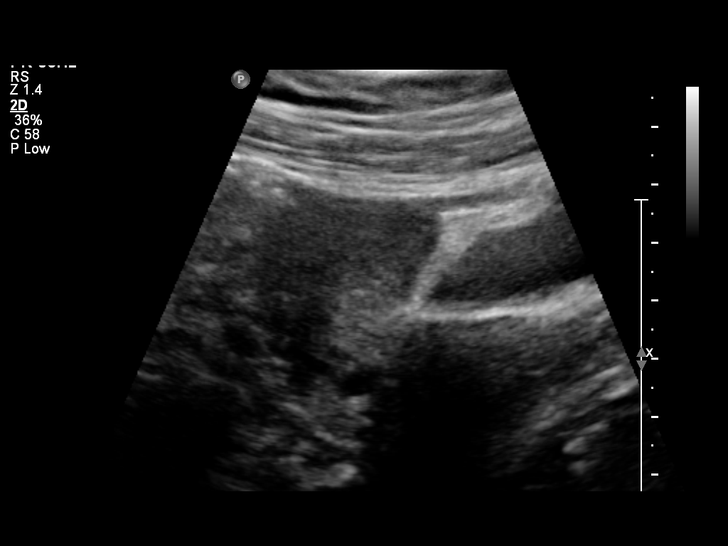
[im 7/41]
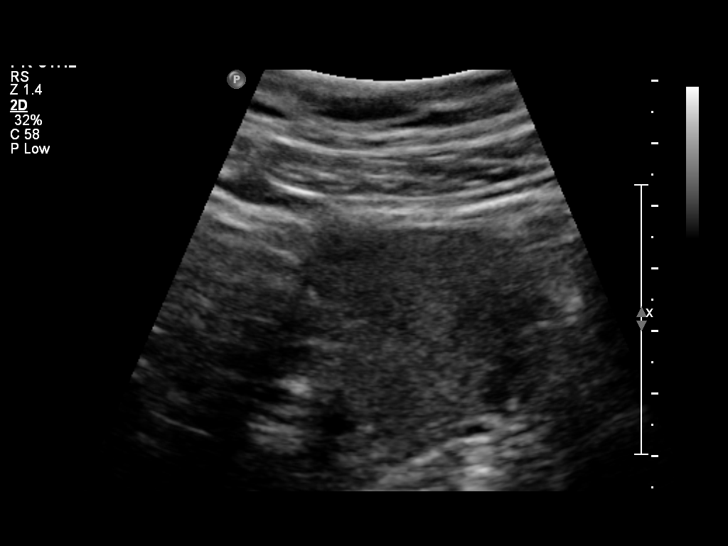
[im 11/41]
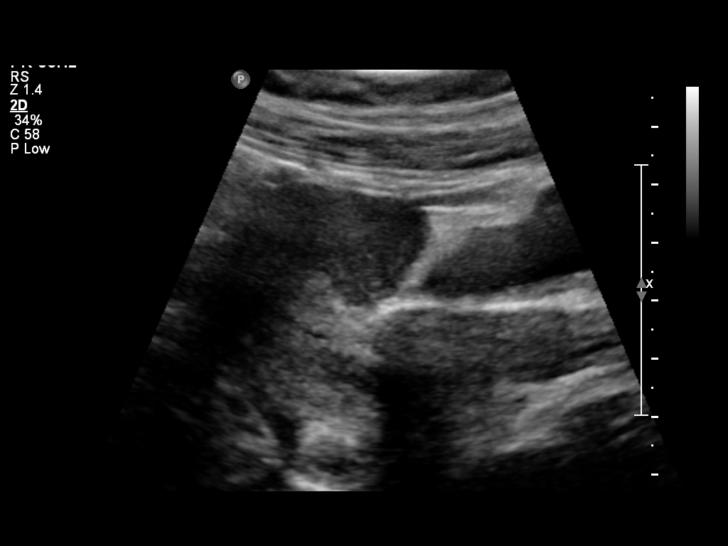
[im 14/41]
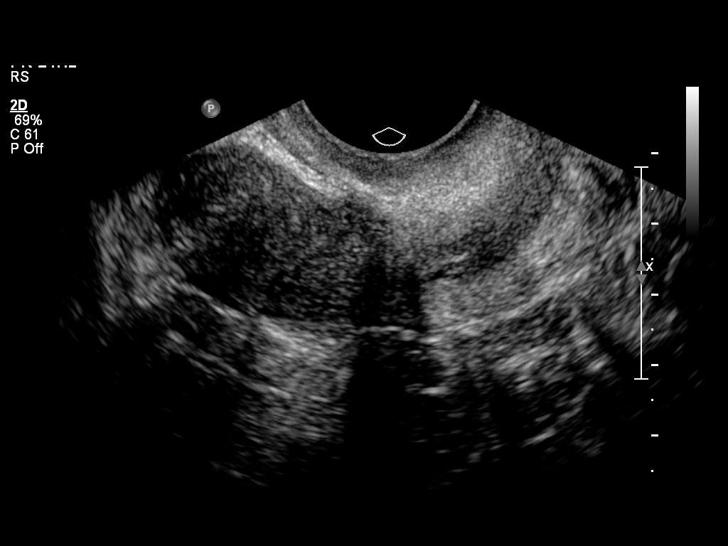
[im 16/41]
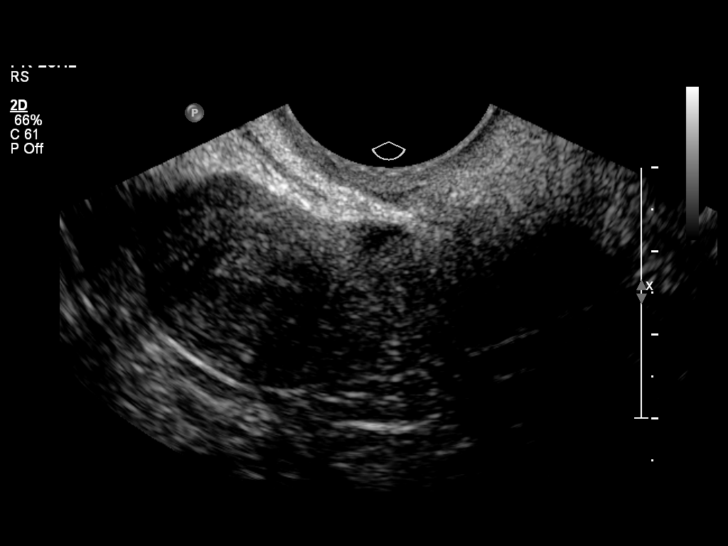
[im 19/41]
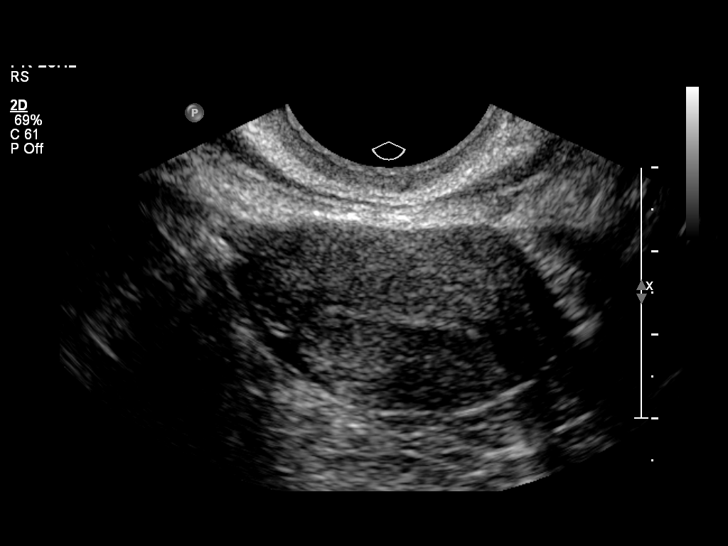
[im 22/41]
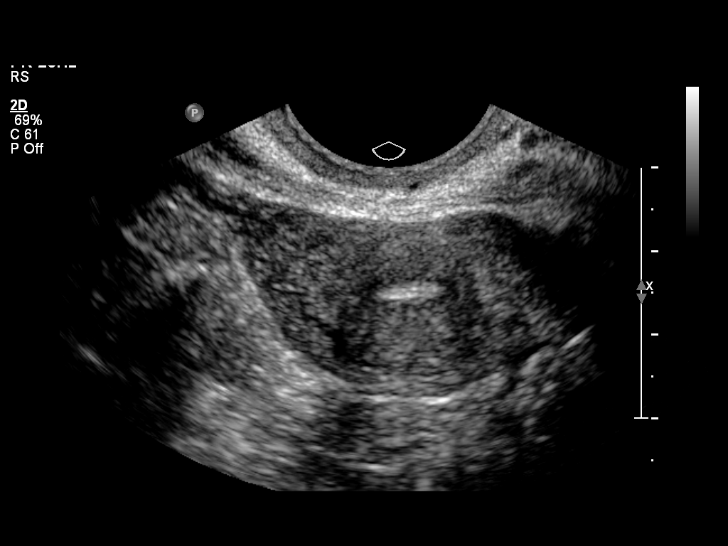
[im 26/41]
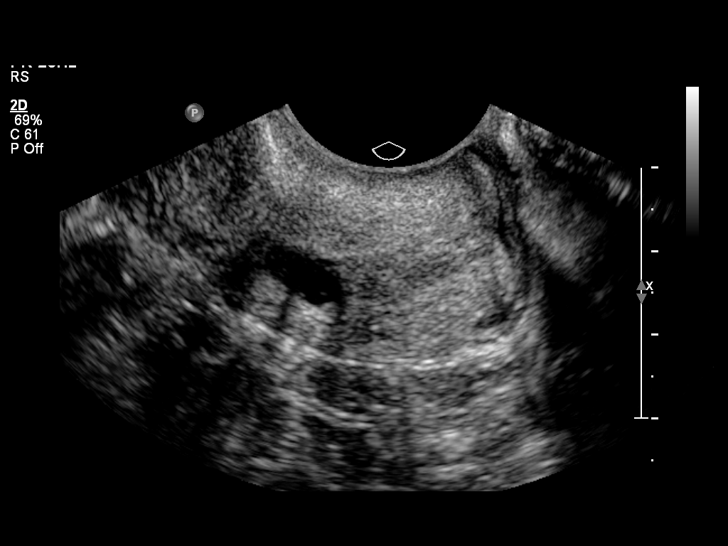
[im 27/41]
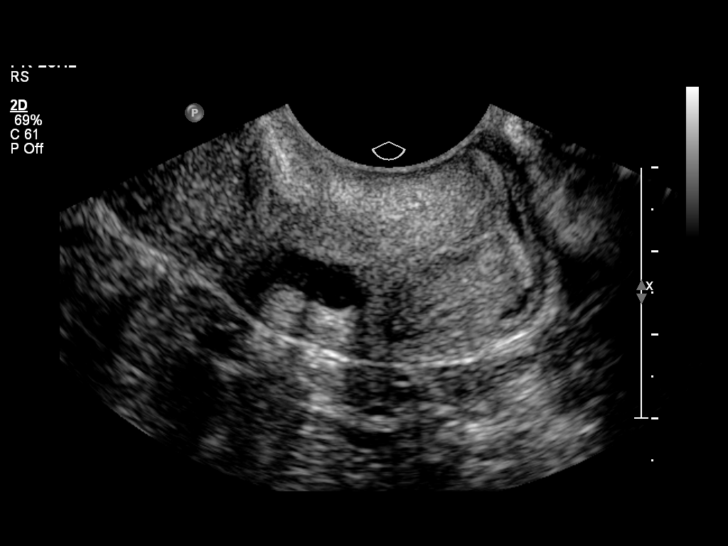
[im 31/41]
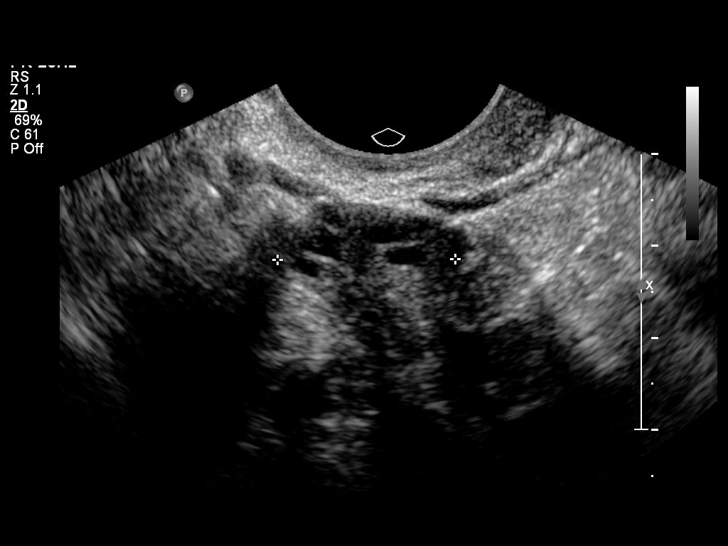
[im 34/41]
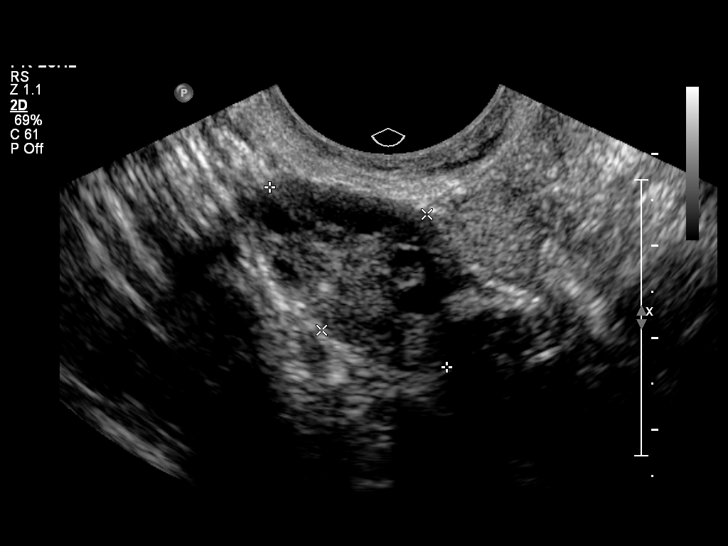
[im 37/41]
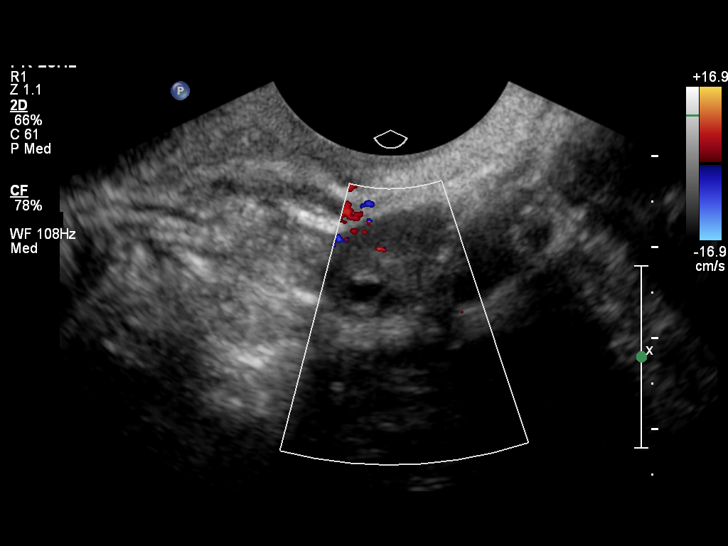
[im 41/41]
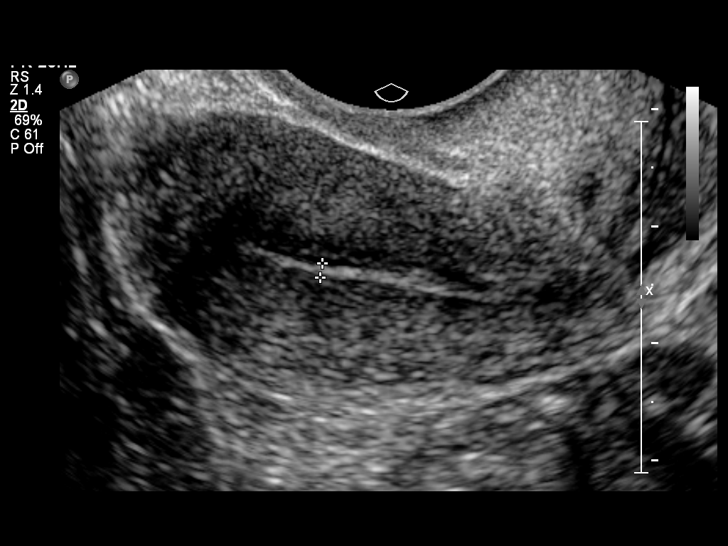

[14 of 25 positions shown; findings below may reference images not displayed]

FINDINGS: Uterus measures 5.8 cm length by 2.3 centers AP diameter by 3.2 cm
transverse diameter.  Within normal limits for echotexture.  The
uterus is anteverted and no focal mass is identified.

Endometrium is homogeneous and thin.  Measures approximately 2 mm
greatest diameter.

Right Ovary is normal in size and sonographic appearance.

Left Ovary is normal in size and sonographic appearance.

Other Findings:  No free pelvic fluid is seen.
IMPRESSION: Normal pelvic ultrasound.

## 2012-02-11 ENCOUNTER — Telehealth: Payer: Self-pay | Admitting: Cardiovascular Disease

## 2012-02-11 ENCOUNTER — Ambulatory Visit: Payer: BC Managed Care – PPO | Admitting: Family Medicine

## 2012-02-11 NOTE — Telephone Encounter (Signed)
Please have her increase her metoprol to 25 BID

## 2012-02-11 NOTE — Telephone Encounter (Signed)
Patient called stated she still has a fast heart beat.States she is doing everything she has been told,drinking more fluids,has increased salt in her diet,no smoking,no alcohol,no caffeine, she has increased exercise.Patient was told Dr.Nahser not in office this afternoon,will forward message to Dr.Nahser for advice.

## 2012-02-11 NOTE — Telephone Encounter (Signed)
8/23 99/54 pulse 104  8/24 99/63 pulse 97  8/25 107/70 pulse 93  8/26 97/60 pu;lse 96  8/27 114/69 pulse 100  9/3 98/55 pulse 114

## 2012-02-12 MED ORDER — METOPROLOL TARTRATE 25 MG PO TABS: 25.0000 mg | ORAL_TABLET | Freq: Two times a day (BID) | ORAL | Status: DC

## 2012-02-12 NOTE — Telephone Encounter (Signed)
Patient called was told Dr.Nahser advised to increase metoprolol to 25 mgs twice a day.Advised to call back if needed.

## 2012-02-12 NOTE — Telephone Encounter (Signed)
Patient called no answer.LMTC. 

## 2012-02-17 ENCOUNTER — Other Ambulatory Visit (HOSPITAL_COMMUNITY): Payer: BC Managed Care – PPO

## 2012-02-19 ENCOUNTER — Encounter (HOSPITAL_COMMUNITY): Payer: Self-pay | Admitting: *Deleted

## 2012-02-19 ENCOUNTER — Emergency Department (HOSPITAL_COMMUNITY)
Admission: EM | Admit: 2012-02-19 | Discharge: 2012-02-20 | Disposition: A | Discharge status: Home / Self Care | Payer: BC Managed Care – PPO | Attending: Emergency Medicine | Admitting: Emergency Medicine

## 2012-02-19 DIAGNOSIS — F411 Generalized anxiety disorder: Secondary | ICD-10-CM | POA: Insufficient documentation

## 2012-02-19 DIAGNOSIS — F3289 Other specified depressive episodes: Secondary | ICD-10-CM | POA: Insufficient documentation

## 2012-02-19 DIAGNOSIS — F329 Major depressive disorder, single episode, unspecified: Secondary | ICD-10-CM | POA: Insufficient documentation

## 2012-02-19 NOTE — ED Notes (Signed)
Pt not in WR when called

## 2012-02-19 NOTE — ED Notes (Signed)
Received call from [facility] office stating they are going to check one location for pt. If she is there and denies SI and does not appear as if she has overdosed, she cannot be brought back in unless she is voluntary. If she confirms SI to [employer], he reports he will take pt in under emergency commitment. Reports [employer] is going to check on other address provided as [place].

## 2012-02-19 NOTE — ED Notes (Signed)
Pt called multiple times from waiting room. No response, pt not found around dept. Asst director made aware due to pt being unknown suicide risk. Advised to call [employer] to attempt to locate pt to bring to ED for further eval and screening. [employer] called and will attempt to locate pt with two addresses. Address on file, [address], and gave turner [address] address that is on pill bottle pt left with ems.
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
## 2012-02-20 NOTE — ED Notes (Signed)
Charge RN never received call from [employer] office or [employer] regarding pt. Assumed pt did not meet criteria for emergency commitment and refused to be transported to hospital. Asst director made aware. Will d/c pt from system.

## 2012-02-21 ENCOUNTER — Ambulatory Visit (INDEPENDENT_AMBULATORY_CARE_PROVIDER_SITE_OTHER): Payer: BC Managed Care – PPO | Admitting: Family Medicine

## 2012-02-21 VITALS — BP 121/82 | HR 94 | Temp 99.4°F | Ht 64.0 in | Wt 187.8 lb

## 2012-02-21 DIAGNOSIS — R000 Tachycardia, unspecified: Secondary | ICD-10-CM

## 2012-02-21 DIAGNOSIS — F341 Dysthymic disorder: Secondary | ICD-10-CM

## 2012-02-21 DIAGNOSIS — M549 Dorsalgia, unspecified: Secondary | ICD-10-CM

## 2012-02-21 DIAGNOSIS — F419 Anxiety disorder, unspecified: Secondary | ICD-10-CM

## 2012-02-21 DIAGNOSIS — M533 Sacrococcygeal disorders, not elsewhere classified: Secondary | ICD-10-CM

## 2012-02-21 DIAGNOSIS — Z23 Encounter for immunization: Secondary | ICD-10-CM

## 2012-02-21 MED ORDER — PREDNISONE 20 MG PO TABS: 20.0000 mg | ORAL_TABLET | Freq: Two times a day (BID) | ORAL | Status: AC

## 2012-02-21 MED ORDER — HYDROCODONE-ACETAMINOPHEN 7.5-325 MG PO TABS: 1.0000 | ORAL_TABLET | Freq: Three times a day (TID) | ORAL | Status: DC | PRN

## 2012-02-21 MED ORDER — ALPRAZOLAM 0.5 MG PO TABS: 0.5000 mg | ORAL_TABLET | Freq: Two times a day (BID) | ORAL | Status: DC | PRN

## 2012-02-21 MED ORDER — METHYLPREDNISOLONE ACETATE 40 MG/ML IJ SUSP
40.0000 mg | Freq: Once | INTRAMUSCULAR | Status: AC
  Administered 2012-02-21: 40 mg via INTRAMUSCULAR

## 2012-02-21 MED ORDER — MELOXICAM 15 MG PO TABS: 15.0000 mg | ORAL_TABLET | Freq: Every day | ORAL | Status: DC | PRN
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
## 2012-02-21 NOTE — Assessment & Plan Note (Addendum)
Has continued to flu vaccine. He took all of her alprazolam when she left her parents house but she's given just 20 to use sparingly. When she is highly tachycardic she notes the present and helped she is given again just 20 to use sparingly. She agrees to start counseling and is given her mother. She's accompanied by her boyfriend who agrees to take her to [facility] this weekend if she becomes impulsive or a risk to herself

## 2012-02-21 NOTE — Patient Instructions (Addendum)

## 2012-02-21 NOTE — Assessment & Plan Note (Signed)
Improved on recheck if she has a great deal of palpitation over the weekend she can increase her Metoprolol to 25 mg po tid
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
## 2012-02-23 ENCOUNTER — Encounter: Payer: Self-pay | Admitting: Family Medicine

## 2012-02-27 ENCOUNTER — Telehealth: Payer: Self-pay | Admitting: *Deleted

## 2012-02-27 NOTE — Telephone Encounter (Signed)
Message copied by Antony Odea on Thu Feb 27, 2012  9:58 AM ------      Message from: Omar Person B      Created: Thu Feb 27, 2012  8:44 AM      Regarding: no show       No show on 9/9

## 2012-02-27 NOTE — Telephone Encounter (Signed)
msg left she missed echo app for 02/05/12 and 02/17/12, told her to call back if she wants to reschedule, number provided.

## 2012-02-28 ENCOUNTER — Ambulatory Visit (INDEPENDENT_AMBULATORY_CARE_PROVIDER_SITE_OTHER): Payer: BC Managed Care – PPO | Admitting: Family Medicine

## 2012-02-28 ENCOUNTER — Encounter: Payer: Self-pay | Admitting: Family Medicine

## 2012-02-28 ENCOUNTER — Ambulatory Visit: Payer: BC Managed Care – PPO | Admitting: Family Medicine

## 2012-02-28 VITALS — BP 107/74 | HR 89 | Temp 98.0°F | Resp 16 | Ht 64.0 in | Wt 188.8 lb

## 2012-02-28 DIAGNOSIS — M533 Sacrococcygeal disorders, not elsewhere classified: Secondary | ICD-10-CM

## 2012-02-28 DIAGNOSIS — F419 Anxiety disorder, unspecified: Secondary | ICD-10-CM

## 2012-02-28 DIAGNOSIS — F341 Dysthymic disorder: Secondary | ICD-10-CM

## 2012-02-28 DIAGNOSIS — R000 Tachycardia, unspecified: Secondary | ICD-10-CM

## 2012-02-28 MED ORDER — ALPRAZOLAM 0.5 MG PO TABS: 0.5000 mg | ORAL_TABLET | Freq: Two times a day (BID) | ORAL | Status: DC | PRN

## 2012-02-28 MED ORDER — HYDROCODONE-ACETAMINOPHEN 7.5-325 MG PO TABS: 1.0000 | ORAL_TABLET | Freq: Three times a day (TID) | ORAL | Status: DC | PRN

## 2012-02-28 NOTE — Assessment & Plan Note (Signed)
Doing much better, no further meltdowns. Is still living with her boyfriend and keeping her distance from her parents. [name] on her mother's side died this week from Cancer with Hospice in place. She reports she has been able to reconcile with many friends and family but is not talking to parents. No changes today
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
## 2012-03-01 NOTE — Assessment & Plan Note (Signed)
Well controlled on current dose of metoprolol continue the same

## 2012-03-02 ENCOUNTER — Other Ambulatory Visit: Payer: Self-pay

## 2012-03-02 ENCOUNTER — Ambulatory Visit: Payer: BC Managed Care – PPO | Admitting: Nurse Practitioner

## 2012-03-02 NOTE — Telephone Encounter (Signed)
Pt called in requesting a 90 day refill for Beyaz and Effexor. Pt stated that during her move she lost these RX's?  Pt would like these RX's to go to [facility]. Please advise?

## 2012-03-02 NOTE — Telephone Encounter (Signed)
OK to refill both meds , 90 day supply with 1 refill once you check and make sure they are not on hold at her local pharmacy

## 2012-03-03 ENCOUNTER — Other Ambulatory Visit: Payer: Self-pay

## 2012-03-03 DIAGNOSIS — F329 Major depressive disorder, single episode, unspecified: Secondary | ICD-10-CM

## 2012-03-03 MED ORDER — VENLAFAXINE HCL ER 150 MG PO CP24: 150.0000 mg | ORAL_CAPSULE | Freq: Every day | ORAL | Status: DC

## 2012-03-03 NOTE — Telephone Encounter (Signed)
RX sent per pts request 

## 2012-03-03 NOTE — Telephone Encounter (Signed)
Left a detailed message for patient to call [facility] and ask them to send her the refill. Patient is also going to have to update her new address with them due to her moving recently

## 2012-03-18 ENCOUNTER — Other Ambulatory Visit: Payer: Self-pay

## 2012-03-18 DIAGNOSIS — M533 Sacrococcygeal disorders, not elsewhere classified: Secondary | ICD-10-CM

## 2012-03-18 NOTE — Telephone Encounter (Signed)
It is too early, she has to wait til next week, Tuesday is the earliest she can have it, same sig, same number

## 2012-03-18 NOTE — Telephone Encounter (Signed)
Please advise Hydrocodone refill. Last RX wrote on 02-28-12 quantity 60 with 0 refills.  Fax to [phone]

## 2012-03-20 ENCOUNTER — Other Ambulatory Visit: Payer: Self-pay

## 2012-03-20 DIAGNOSIS — M533 Sacrococcygeal disorders, not elsewhere classified: Secondary | ICD-10-CM

## 2012-03-20 MED ORDER — HYDROCODONE-ACETAMINOPHEN 7.5-325 MG PO TABS: 1.0000 | ORAL_TABLET | Freq: Three times a day (TID) | ORAL | Status: DC | PRN

## 2012-03-20 NOTE — Telephone Encounter (Signed)
Patient called stating that she is cramping and none of the other medications she has is helping? Pt would like to know if MD would send in a "handful" of pain pills for her? Yesterday its noted that she has to wait until 03-24-12? Please advise?

## 2012-03-20 NOTE — Telephone Encounter (Signed)
RX faxed and pt informed

## 2012-03-20 NOTE — Telephone Encounter (Signed)
She can have 5 pills for the weekend, same sig and strength as usual

## 2012-03-24 ENCOUNTER — Other Ambulatory Visit: Payer: Self-pay

## 2012-03-24 DIAGNOSIS — M533 Sacrococcygeal disorders, not elsewhere classified: Secondary | ICD-10-CM

## 2012-03-25 MED ORDER — HYDROCODONE-ACETAMINOPHEN 7.5-325 MG PO TABS: 1.0000 | ORAL_TABLET | Freq: Three times a day (TID) | ORAL | Status: DC | PRN

## 2012-03-25 NOTE — Telephone Encounter (Signed)
RX faxed

## 2012-04-01 ENCOUNTER — Ambulatory Visit: Payer: BC Managed Care – PPO | Admitting: Family Medicine

## 2012-04-06 ENCOUNTER — Ambulatory Visit (INDEPENDENT_AMBULATORY_CARE_PROVIDER_SITE_OTHER): Payer: BC Managed Care – PPO | Admitting: Family Medicine

## 2012-04-06 ENCOUNTER — Encounter: Payer: Self-pay | Admitting: Family Medicine

## 2012-04-06 VITALS — BP 91/65 | HR 77 | Temp 98.2°F | Ht 64.0 in | Wt 188.0 lb

## 2012-04-06 DIAGNOSIS — M549 Dorsalgia, unspecified: Secondary | ICD-10-CM

## 2012-04-06 DIAGNOSIS — H6690 Otitis media, unspecified, unspecified ear: Secondary | ICD-10-CM

## 2012-04-06 DIAGNOSIS — R110 Nausea: Secondary | ICD-10-CM

## 2012-04-06 DIAGNOSIS — M533 Sacrococcygeal disorders, not elsewhere classified: Secondary | ICD-10-CM

## 2012-04-06 DIAGNOSIS — F341 Dysthymic disorder: Secondary | ICD-10-CM

## 2012-04-06 DIAGNOSIS — F329 Major depressive disorder, single episode, unspecified: Secondary | ICD-10-CM

## 2012-04-06 MED ORDER — HYDROCODONE-ACETAMINOPHEN 7.5-325 MG PO TABS: 1.0000 | ORAL_TABLET | Freq: Three times a day (TID) | ORAL | Status: DC | PRN

## 2012-04-06 MED ORDER — PROMETHAZINE HCL 25 MG PO TABS: 25.0000 mg | ORAL_TABLET | Freq: Three times a day (TID) | ORAL | Status: DC | PRN

## 2012-04-06 MED ORDER — VENLAFAXINE HCL ER 150 MG PO CP24: 150.0000 mg | ORAL_CAPSULE | Freq: Every day | ORAL | Status: DC

## 2012-04-06 MED ORDER — AMOXICILLIN-POT CLAVULANATE 875-125 MG PO TABS: 1.0000 | ORAL_TABLET | Freq: Two times a day (BID) | ORAL | Status: DC

## 2012-04-06 MED ORDER — CLONAZEPAM 0.5 MG PO TABS: ORAL_TABLET | ORAL | Status: DC

## 2012-04-06 NOTE — Assessment & Plan Note (Addendum)
Patient has moved in with her grandparents to help them out lately. Her grandmother has just lost both her parents recently. Despite her new living situation she feels she is doing well on the Venlafaxine but she feels the Alprazolam makes her increasingly irritable. Will try changing to Clonazepam prn and reassess at next visit.

## 2012-04-07 NOTE — Assessment & Plan Note (Signed)
Has persistent low back pain and intermittent radicular symptoms to her right hip but is now experiencing increasing difficulties in her thoracic spine as well. She finally agrees to referral to orthopaedics for further consideration. Given refills on pain meds for now

## 2012-04-07 NOTE — Progress Notes (Signed)
Patient ID: Kathryn Randall, female   DOB: 1990/10/11, 21 y.o.   MRN: [id number] Kathryn Randall [id number] [birth year] 04/07/2012      Progress Note-Follow Up  Subjective  Chief Complaint  Chief Complaint  Patient presents with  . Follow-up    2 week     HPI  Patient is a 21 year old Caucasian female who is in today for followup. Overall she feels well although she does acknowledge couple days now some increased facial pressure and congestion as well as some aching occasionally in her ears. No fevers, chills, diffuse headache. No sore throat, chest pain, palpitations, shortness of breath, coughing or wheezing. No GI or GU complaints. She has recently moved in to help take care of her grandparents and despite these changes she feels she's doing relatively well. She started to speak with her parents again and has gone to visit recently and says that has gone well. She continues to have trouble to her low back and is now beginning to have some trouble thoracic spine. She recently accompanied her boyfriend's mother orthopedic visit and acknowledges now that she probably needs to go and is willing to accept a referral. She is concerned she feels when she takes alprazolam for anxiety it makes her increasingly irritable. Lorazepam was not tolerated. She does feel the venlafaxine helps her baseline but she would like to try a different medication for her moments of high anxiety and panic which are less frequent than in the past.  Past Medical History  Diagnosis Date  . Anxiety   . Depression   . Back pain   . Migraine 06/28/2011  . Allergic state 06/28/2011  . Preventative health care 06/28/2011  . Anxiety and depression 07/17/2011  . UTI (lower urinary tract infection) 08/18/2011  . Tachycardia 11/14/2011  . Anemia 11/14/2011  . Hypokalemia 11/14/2011    Past Surgical History  Procedure Date  . Wisdom tooth extraction 21 yrs old    Family History  Problem Relation Age of Onset  . Migraines  Mother   . Migraines Father   . Obesity Maternal Grandmother   . Heart disease Maternal Grandmother     CHF  . Hypertension Maternal Grandmother   . Diabetes Maternal Grandfather     type 2  . Obesity Maternal Grandfather   . Leukemia Paternal Grandfather   . Migraines Paternal Grandfather     History   Social History  . Marital Status: Single    Spouse Name: N/A    Number of Children: N/A  . Years of Education: N/A   Occupational History  . Not on file.   Social History Main Topics  . Smoking status: Never Smoker   . Smokeless tobacco: Never Used   Comment: black and milds every once in awhile  . Alcohol Use: No  . Drug Use: No  . Sexually Active: Not Currently -- Female partner(s)   Other Topics Concern  . Not on file   Social History Narrative  . No narrative on file    Current Outpatient Prescriptions on File Prior to Visit  Medication Sig Dispense Refill  . Drospirenone-Ethinyl Estradiol-Levomefol (BEYAZ) 3-0.02-0.451 MG tablet Take 1 tablet by mouth daily.  3 Package  1  . metoprolol tartrate (LOPRESSOR) 25 MG tablet Take 1 tablet (25 mg total) by mouth 2 (two) times daily.  60 tablet  6  . venlafaxine XR (EFFEXOR-XR) 150 MG 24 hr capsule Take 1 capsule (150 mg total) by mouth daily.  90 capsule  1  .  clonazePAM (KLONOPIN) 0.5 MG tablet 1/2 to 1 tab po bid prn anxiety, insomnia  30 tablet  1  . meloxicam (MOBIC) 15 MG tablet Take 1 tablet (15 mg total) by mouth daily as needed for pain. With food  30 tablet  3  . promethazine (PHENERGAN) 25 MG tablet Take 1 tablet (25 mg total) by mouth every 8 (eight) hours as needed.  40 tablet  2    Allergies  Allergen Reactions  . Ibuprofen     Racing heart  . Morphine And Related Itching    Review of Systems  Review of Systems  Constitutional: Negative for fever and malaise/fatigue.  HENT: Negative for congestion.   Eyes: Negative for pain and discharge.  Respiratory: Negative for shortness of breath.     Cardiovascular: Negative for chest pain, palpitations and leg swelling.  Gastrointestinal: Negative for nausea, abdominal pain and diarrhea.  Genitourinary: Negative for dysuria.  Musculoskeletal: Positive for back pain. Negative for falls.  Skin: Negative for rash.  Neurological: Negative for loss of consciousness and headaches.  Endo/Heme/Allergies: Negative for polydipsia.  Psychiatric/Behavioral: Negative for depression and suicidal ideas. The patient is nervous/anxious. The patient does not have insomnia.     Objective  BP 91/65  Pulse 77  Temp 98.2 F (36.8 C) (Temporal)  Ht 5\' 4"  (1.626 m)  Wt 188 lb (85.276 kg)  BMI 32.27 kg/m2  SpO2 98%  LMP 03/30/2012  Physical Exam  Physical Exam  Constitutional: She is oriented to person, place, and time and well-developed, well-nourished, and in no distress. No distress.  HENT:  Head: Normocephalic and atraumatic.  Eyes: Conjunctivae normal are normal.  Neck: Neck supple. No thyromegaly present.  Cardiovascular: Normal rate, regular rhythm and normal heart sounds.   No murmur heard. Pulmonary/Chest: Effort normal and breath sounds normal. She has no wheezes.  Abdominal: She exhibits no distension and no mass.  Musculoskeletal: She exhibits no edema.  Lymphadenopathy:    She has no cervical adenopathy.  Neurological: She is alert and oriented to person, place, and time.  Skin: Skin is warm and dry. No rash noted. She is not diaphoretic.  Psychiatric: Memory, affect and judgment normal.    Lab Results  Component Value Date   TSH 1.07 11/14/2011   Lab Results  Component Value Date   WBC 8.0 11/14/2011   HGB 13.2 11/14/2011   HCT 40.3 11/14/2011   MCV 89.6 11/14/2011   PLT 364.0 11/14/2011   Lab Results  Component Value Date   CREATININE 0.9 11/14/2011   BUN 14 11/14/2011   NA 140 11/14/2011   K 4.0 11/14/2011   CL 106 11/14/2011   CO2 25 11/14/2011   Lab Results  Component Value Date   ALT 13 07/17/2011   AST 18 07/17/2011   ALKPHOS  62 07/17/2011   BILITOT 0.2* 07/17/2011   Lab Results  Component Value Date   CHOL 163 07/17/2011   Lab Results  Component Value Date   HDL 81.90 07/17/2011   Lab Results  Component Value Date   LDLCALC 53 07/17/2011   Lab Results  Component Value Date   TRIG 140.0 07/17/2011   Lab Results  Component Value Date   CHOLHDL 2 07/17/2011     Assessment & Plan  Anxiety and depression Patient has moved in with her grandparents to help them out lately. Her grandmother has just lost both her parents recently. Despite her new living situation she feels she is doing well on the Venlafaxine but she  feels the Alprazolam makes her increasingly irritable. Will try changing to Clonazepam prn and reassess at next visit.  Back pain Has persistent low back pain and intermittent radicular symptoms to her right hip but is now experiencing increasing difficulties in her thoracic spine as well. She finally agrees to referral to orthopaedics for further consideration. Given refills on pain meds for now

## 2012-04-13 ENCOUNTER — Ambulatory Visit: Payer: BC Managed Care – PPO | Admitting: Family Medicine

## 2012-04-27 ENCOUNTER — Ambulatory Visit (INDEPENDENT_AMBULATORY_CARE_PROVIDER_SITE_OTHER): Payer: BC Managed Care – PPO | Admitting: Family Medicine

## 2012-04-27 ENCOUNTER — Encounter: Payer: Self-pay | Admitting: Family Medicine

## 2012-04-27 VITALS — BP 118/73 | HR 86 | Temp 98.0°F | Ht 64.0 in | Wt 199.9 lb

## 2012-04-27 DIAGNOSIS — E876 Hypokalemia: Secondary | ICD-10-CM

## 2012-04-27 DIAGNOSIS — G4700 Insomnia, unspecified: Secondary | ICD-10-CM

## 2012-04-27 DIAGNOSIS — F419 Anxiety disorder, unspecified: Secondary | ICD-10-CM

## 2012-04-27 DIAGNOSIS — F411 Generalized anxiety disorder: Secondary | ICD-10-CM

## 2012-04-27 DIAGNOSIS — M533 Sacrococcygeal disorders, not elsewhere classified: Secondary | ICD-10-CM

## 2012-04-27 DIAGNOSIS — M549 Dorsalgia, unspecified: Secondary | ICD-10-CM

## 2012-04-27 DIAGNOSIS — E663 Overweight: Secondary | ICD-10-CM | POA: Insufficient documentation

## 2012-04-27 DIAGNOSIS — D649 Anemia, unspecified: Secondary | ICD-10-CM

## 2012-04-27 DIAGNOSIS — R000 Tachycardia, unspecified: Secondary | ICD-10-CM

## 2012-04-27 HISTORY — DX: Overweight: E66.3

## 2012-04-27 LAB — HEPATIC FUNCTION PANEL
ALT: 11 U/L (ref 0–35)
AST: 21 U/L (ref 0–37)
Albumin: 3.6 g/dL (ref 3.5–5.2)
Alkaline Phosphatase: 69 U/L (ref 39–117)
Bilirubin, Direct: 0.1 mg/dL (ref 0.0–0.3)
Total Protein: 7.5 g/dL (ref 6.0–8.3)

## 2012-04-27 LAB — RENAL FUNCTION PANEL
Albumin: 3.6 g/dL (ref 3.5–5.2)
BUN: 15 mg/dL (ref 6–23)
CO2: 26 mEq/L (ref 19–32)
Calcium: 8.9 mg/dL (ref 8.4–10.5)
Chloride: 106 mEq/L (ref 96–112)
Phosphorus: 3.8 mg/dL (ref 2.3–4.6)
Potassium: 5 mEq/L (ref 3.5–5.1)

## 2012-04-27 LAB — CBC
Hemoglobin: 11.9 g/dL — ABNORMAL LOW (ref 12.0–15.0)
MCHC: 32.5 g/dL (ref 30.0–36.0)
Platelets: 369 10*3/uL (ref 150.0–400.0)
RBC: 4.2 Mil/uL (ref 3.87–5.11)

## 2012-04-27 LAB — TSH: TSH: 1.21 u[IU]/mL (ref 0.35–5.50)

## 2012-04-27 MED ORDER — HYDROCODONE-ACETAMINOPHEN 7.5-325 MG PO TABS: 1.0000 | ORAL_TABLET | Freq: Three times a day (TID) | ORAL | Status: DC | PRN

## 2012-04-27 MED ORDER — ALPRAZOLAM 0.5 MG PO TABS: ORAL_TABLET | ORAL | Status: DC

## 2012-04-27 NOTE — Assessment & Plan Note (Signed)
Repeat cbc today 

## 2012-04-27 NOTE — Assessment & Plan Note (Signed)
Adequately controlled on beta blockade, no changes

## 2012-04-27 NOTE — Assessment & Plan Note (Signed)
Encouraged DASH diet, increase exercise, check TSH and renal panel

## 2012-04-27 NOTE — Patient Instructions (Addendum)

## 2012-04-27 NOTE — Assessment & Plan Note (Signed)
Renal panel run today

## 2012-04-27 NOTE — Progress Notes (Signed)
Patient ID: Kathryn Randall, female   DOB: 1990/09/17, 21 y.o.   MRN: [id number] Kathryn Randall [id number] [birth year] 04/27/2012      Progress Note-Follow Up  Subjective  Chief Complaint  Chief Complaint  Patient presents with  . Follow-up    3 week    HPI  Patient is a 21 year old Caucasian female who is in today for followup. She continues to struggle with low back pain but is now following closely with Dr. Payton Doughty orthopedics. She's recently had an MRI and is awaiting results. Continues to take meloxicam daily as her baseline and uses hydrocodone sparingly for breakthrough pain especially during her menstrual cycle when her pain seems to worsen. She is presently on her cycle and struggling with pain. We tried switching from alprazolam to clonazepam but it did not help her sleep any better and helped her panic attacks less. She uses the medications infrequently. No recent illness. Continues to struggle with stress with her family no new developments. No chest pain, shortness of breath or GI complaints. Palpitations endocrine with anxiety attacks. They tend to happen when a lot of people are present. Metoprolol 25-50 mg twice daily is helpful. She does feel that in the vaccine is helping her anxiety as a whole. Very frustrated with her recent weight gain  Past Medical History  Diagnosis Date  . Anxiety   . Depression   . Back pain   . Migraine 06/28/2011  . Allergic state 06/28/2011  . Preventative health care 06/28/2011  . Anxiety and depression 07/17/2011  . UTI (lower urinary tract infection) 08/18/2011  . Tachycardia 11/14/2011  . Anemia 11/14/2011  . Hypokalemia 11/14/2011  . Overweight 04/27/2012    Past Surgical History  Procedure Date  . Wisdom tooth extraction 21 yrs old    Family History  Problem Relation Age of Onset  . Migraines Mother   . Migraines Father   . Obesity Maternal Grandmother   . Heart disease Maternal Grandmother     CHF  . Hypertension Maternal  Grandmother   . Diabetes Maternal Grandfather     type 2  . Obesity Maternal Grandfather   . Leukemia Paternal Grandfather   . Migraines Paternal Grandfather     History   Social History  . Marital Status: Single    Spouse Name: N/A    Number of Children: N/A  . Years of Education: N/A   Occupational History  . Not on file.   Social History Main Topics  . Smoking status: Never Smoker   . Smokeless tobacco: Never Used     Comment: black and milds every once in awhile  . Alcohol Use: No  . Drug Use: No  . Sexually Active: Not Currently -- Female partner(s)   Other Topics Concern  . Not on file   Social History Narrative  . No narrative on file    Current Outpatient Prescriptions on File Prior to Visit  Medication Sig Dispense Refill  . Drospirenone-Ethinyl Estradiol-Levomefol (BEYAZ) 3-0.02-0.451 MG tablet Take 1 tablet by mouth daily.  3 Package  1  . meloxicam (MOBIC) 15 MG tablet Take 1 tablet (15 mg total) by mouth daily as needed for pain. With food  30 tablet  3  . metoprolol tartrate (LOPRESSOR) 25 MG tablet Take 1 tablet (25 mg total) by mouth 2 (two) times daily.  60 tablet  6  . promethazine (PHENERGAN) 25 MG tablet Take 1 tablet (25 mg total) by mouth every 8 (eight) hours as needed.  40 tablet  2  . venlafaxine XR (EFFEXOR-XR) 150 MG 24 hr capsule Take 1 capsule (150 mg total) by mouth daily.  90 capsule  1    Allergies  Allergen Reactions  . Ibuprofen     Racing heart  . Morphine And Related Itching    Review of Systems  Review of Systems  Constitutional: Negative for fever and malaise/fatigue.  HENT: Negative for congestion.   Eyes: Negative for discharge.  Respiratory: Negative for shortness of breath.   Cardiovascular: Negative for chest pain, palpitations and leg swelling.  Gastrointestinal: Positive for abdominal pain. Negative for nausea and diarrhea.  Genitourinary: Negative for dysuria.  Musculoskeletal: Positive for back pain. Negative for  falls.  Skin: Negative for rash.  Neurological: Negative for loss of consciousness and headaches.  Endo/Heme/Allergies: Negative for polydipsia.  Psychiatric/Behavioral: Negative for depression and suicidal ideas. The patient is nervous/anxious and has insomnia.     Objective  BP 118/73  Pulse 86  Temp 98 F (36.7 C) (Temporal)  Ht 5\' 4"  (1.626 m)  Wt 199 lb 14.4 oz (90.674 kg)  BMI 34.31 kg/m2  SpO2 99%  LMP 03/29/2012  Physical Exam  Physical Exam  Constitutional: She is oriented to person, place, and time and well-developed, well-nourished, and in no distress. No distress.  HENT:  Head: Normocephalic and atraumatic.  Eyes: Conjunctivae normal are normal.  Neck: Neck supple. No thyromegaly present.  Cardiovascular: Normal rate, regular rhythm and normal heart sounds.   No murmur heard. Pulmonary/Chest: Effort normal and breath sounds normal. She has no wheezes.  Abdominal: She exhibits no distension and no mass.  Musculoskeletal: She exhibits no edema.  Lymphadenopathy:    She has no cervical adenopathy.  Neurological: She is alert and oriented to person, place, and time.  Skin: Skin is warm and dry. No rash noted. She is not diaphoretic.  Psychiatric: Memory, affect and judgment normal.    Lab Results  Component Value Date   TSH 1.07 11/14/2011   Lab Results  Component Value Date   WBC 8.0 11/14/2011   HGB 13.2 11/14/2011   HCT 40.3 11/14/2011   MCV 89.6 11/14/2011   PLT 364.0 11/14/2011   Lab Results  Component Value Date   CREATININE 0.9 11/14/2011   BUN 14 11/14/2011   NA 140 11/14/2011   K 4.0 11/14/2011   CL 106 11/14/2011   CO2 25 11/14/2011   Lab Results  Component Value Date   ALT 13 07/17/2011   AST 18 07/17/2011   ALKPHOS 62 07/17/2011   BILITOT 0.2* 07/17/2011   Lab Results  Component Value Date   CHOL 163 07/17/2011   Lab Results  Component Value Date   HDL 81.90 07/17/2011   Lab Results  Component Value Date   LDLCALC 53 07/17/2011   Lab Results  Component  Value Date   TRIG 140.0 07/17/2011   Lab Results  Component Value Date   CHOLHDL 2 07/17/2011     Assessment & Plan  Tachycardia Adequately controlled on beta blockade, no changes  Hypokalemia Renal panel run today  Back pain Had a MRI with Dr Yevette Edwards is awaiting further input. Using Meloxicam daily with good results, continue Hydrocodone prn. Encouraged increased exercise  Anemia Repeat cbc today.   Overweight Encouraged DASH diet, increase exercise, check TSH and renal panel

## 2012-04-27 NOTE — Assessment & Plan Note (Addendum)
Had a MRI with Dr Yevette Edwards is awaiting further input. Using Meloxicam daily with good results, continue Hydrocodone prn. Encouraged increased exercise

## 2012-05-04 ENCOUNTER — Ambulatory Visit: Payer: BC Managed Care – PPO | Admitting: Cardiovascular Disease

## 2012-05-06 ENCOUNTER — Ambulatory Visit: Payer: BC Managed Care – PPO | Admitting: Psychiatry

## 2012-05-12 ENCOUNTER — Ambulatory Visit (INDEPENDENT_AMBULATORY_CARE_PROVIDER_SITE_OTHER): Payer: BC Managed Care – PPO | Admitting: Psychiatry

## 2012-05-12 DIAGNOSIS — F329 Major depressive disorder, single episode, unspecified: Secondary | ICD-10-CM

## 2012-05-12 DIAGNOSIS — F410 Panic disorder [episodic paroxysmal anxiety] without agoraphobia: Secondary | ICD-10-CM

## 2012-05-18 ENCOUNTER — Other Ambulatory Visit: Payer: Self-pay | Admitting: Family Medicine

## 2012-05-18 NOTE — Telephone Encounter (Signed)
Last RX was 04-27-12 quantity 40 with 1 refill. Should have 1 refill left

## 2012-05-20 ENCOUNTER — Ambulatory Visit (INDEPENDENT_AMBULATORY_CARE_PROVIDER_SITE_OTHER): Payer: BC Managed Care – PPO | Admitting: Psychiatry

## 2012-05-20 DIAGNOSIS — F329 Major depressive disorder, single episode, unspecified: Secondary | ICD-10-CM

## 2012-05-20 DIAGNOSIS — F410 Panic disorder [episodic paroxysmal anxiety] without agoraphobia: Secondary | ICD-10-CM

## 2012-05-22 ENCOUNTER — Other Ambulatory Visit: Payer: Self-pay

## 2012-05-22 NOTE — Telephone Encounter (Signed)
Patient left a message stating that she would like to have her Xanax and Hydrocodone refilled. Pt states she started her period and is in a lot of pain?  Last RX's were wrote on 04-27-12 both quantity 40 with 1 refill. Patient should not need refills.  I spoke

## 2012-05-22 NOTE — Telephone Encounter (Signed)
I spoke with [name] at the pharmacy and she stated that the last 2 RX's for Xanax and Hydrocodone were picked up on 04-27-12 and 05-06-12.   Per md inform patient that these two medications should be 30 day supplies.   Left a detailed message on patients cell phone

## 2012-05-26 ENCOUNTER — Encounter: Payer: Self-pay | Admitting: Family Medicine

## 2012-05-26 ENCOUNTER — Ambulatory Visit (INDEPENDENT_AMBULATORY_CARE_PROVIDER_SITE_OTHER): Payer: BC Managed Care – PPO | Admitting: Family Medicine

## 2012-05-26 VITALS — BP 130/78 | HR 113 | Temp 99.6°F | Ht 64.0 in | Wt 197.8 lb

## 2012-05-26 DIAGNOSIS — Z124 Encounter for screening for malignant neoplasm of cervix: Secondary | ICD-10-CM

## 2012-05-26 DIAGNOSIS — N946 Dysmenorrhea, unspecified: Secondary | ICD-10-CM

## 2012-05-26 DIAGNOSIS — D649 Anemia, unspecified: Secondary | ICD-10-CM

## 2012-05-26 DIAGNOSIS — R000 Tachycardia, unspecified: Secondary | ICD-10-CM

## 2012-05-26 DIAGNOSIS — F329 Major depressive disorder, single episode, unspecified: Secondary | ICD-10-CM

## 2012-05-26 DIAGNOSIS — F419 Anxiety disorder, unspecified: Secondary | ICD-10-CM

## 2012-05-26 DIAGNOSIS — M533 Sacrococcygeal disorders, not elsewhere classified: Secondary | ICD-10-CM

## 2012-05-26 DIAGNOSIS — Z9229 Personal history of other drug therapy: Secondary | ICD-10-CM

## 2012-05-26 DIAGNOSIS — F341 Dysthymic disorder: Secondary | ICD-10-CM

## 2012-05-26 DIAGNOSIS — Z09 Encounter for follow-up examination after completed treatment for conditions other than malignant neoplasm: Secondary | ICD-10-CM

## 2012-05-26 DIAGNOSIS — F411 Generalized anxiety disorder: Secondary | ICD-10-CM

## 2012-05-26 DIAGNOSIS — G4700 Insomnia, unspecified: Secondary | ICD-10-CM

## 2012-05-26 DIAGNOSIS — E876 Hypokalemia: Secondary | ICD-10-CM

## 2012-05-26 HISTORY — DX: Dysmenorrhea, unspecified: N94.6

## 2012-05-26 HISTORY — DX: Encounter for screening for malignant neoplasm of cervix: Z12.4

## 2012-05-26 LAB — RENAL FUNCTION PANEL
Creatinine, Ser: 0.7 mg/dL (ref 0.4–1.2)
GFR: 113.96 mL/min (ref >60.00)
Glucose, Bld: 70 mg/dL (ref 70–99)
Phosphorus: 3.3 mg/dL (ref 2.3–4.6)
Sodium: 140 mEq/L (ref 135–145)

## 2012-05-26 LAB — CBC
HCT: 36.1 % (ref 36.0–46.0)
MCV: 87.3 fl (ref 78.0–100.0)
RDW: 14.3 % (ref 11.5–14.6)

## 2012-05-26 MED ORDER — ALPRAZOLAM 1 MG PO TABS: 1.0000 mg | ORAL_TABLET | Freq: Two times a day (BID) | ORAL | Status: DC | PRN

## 2012-05-26 MED ORDER — HYDROCODONE-ACETAMINOPHEN 7.5-325 MG PO TABS: 1.0000 | ORAL_TABLET | Freq: Three times a day (TID) | ORAL | Status: DC | PRN

## 2012-05-26 NOTE — Patient Instructions (Addendum)

## 2012-05-26 NOTE — Assessment & Plan Note (Signed)
Has started counseling and feels that helps. Is tolerating the Venlafaxine but has needed more Alprazolam recently due to ongoing struggles with boyfriend and parents. Discussed at length the need not to overuse medications and to take as little as possible. She agrees to try and keep the number down understands that 40 tablet is going to be at least a 30 day supply and the HCT is less than a month that is better. She expresses understanding. We'll consider going up on venlafaxine next month if she is not able to cut back on her alprazolam use

## 2012-05-26 NOTE — Assessment & Plan Note (Signed)
Mild, repeat CBC today for surveillance

## 2012-05-26 NOTE — Progress Notes (Signed)
Patient ID: Kathryn Randall, female   DOB: 1991/04/11, 21 y.o.   MRN: [id number] DAZJA HOUCHIN [id number] [birth year] 05/26/2012      Progress Note-Follow Up  Subjective  Chief Complaint  Chief Complaint  Patient presents with  . Follow-up    1 month    HPI  Patient is a 21 year old Caucasian female who is in today for evaluation of multiple medical problems. She has started with the counselor she is happy with and does feel it is helping her to manage her stress with her family. She reports her parents and her doing better he apologized and overall the relationship is strained. He continues to live with grandmother and is now having some difficulty with her boyfriend who is diagnosed as bipolar but does not take his medications. They broke up recently but didn't pass never. At this point she is getting organized in trying to return to school. Overall she feels that emotionally she is doing somewhat better. Physically she's had some increased dental pain recently poor dentition, did take some hydrocodone for this. Also struggles with severe menstrual cramps and uses hydrocortisone for that. No other acute illness, chest pain, palpitations, shortness of breath, GI or GU complaints noted  Past Medical History  Diagnosis Date  . Anxiety   . Depression   . Back pain   . Migraine 06/28/2011  . Allergic state 06/28/2011  . Preventative health care 06/28/2011  . Anxiety and depression 07/17/2011  . UTI (lower urinary tract infection) 08/18/2011  . Tachycardia 11/14/2011  . Anemia 11/14/2011  . Hypokalemia 11/14/2011  . Overweight 04/27/2012  . Dysmenorrhea 05/26/2012    Past Surgical History  Procedure Date  . Wisdom tooth extraction 21 yrs old    Family History  Problem Relation Age of Onset  . Migraines Mother   . Migraines Father   . Obesity Maternal Grandmother   . Heart disease Maternal Grandmother     CHF  . Hypertension Maternal Grandmother   . Diabetes Maternal Grandfather      type 2  . Obesity Maternal Grandfather   . Leukemia Paternal Grandfather   . Migraines Paternal Grandfather     History   Social History  . Marital Status: Single    Spouse Name: N/A    Number of Children: N/A  . Years of Education: N/A   Occupational History  . Not on file.   Social History Main Topics  . Smoking status: Never Smoker   . Smokeless tobacco: Never Used     Comment: black and milds every once in awhile  . Alcohol Use: No  . Drug Use: No  . Sexually Active: Not Currently -- Female partner(s)   Other Topics Concern  . Not on file   Social History Narrative  . No narrative on file    Current Outpatient Prescriptions on File Prior to Visit  Medication Sig Dispense Refill  . Drospirenone-Ethinyl Estradiol-Levomefol (BEYAZ) 3-0.02-0.451 MG tablet Take 1 tablet by mouth daily.  3 Package  1  . meloxicam (MOBIC) 15 MG tablet Take 1 tablet (15 mg total) by mouth daily as needed for pain. With food  30 tablet  3  . metoprolol tartrate (LOPRESSOR) 25 MG tablet Take 1 tablet (25 mg total) by mouth 2 (two) times daily.  60 tablet  6  . promethazine (PHENERGAN) 25 MG tablet Take 1 tablet (25 mg total) by mouth every 8 (eight) hours as needed.  40 tablet  2  . venlafaxine XR (EFFEXOR-XR)  150 MG 24 hr capsule Take 1 capsule (150 mg total) by mouth daily.  90 capsule  1    Allergies  Allergen Reactions  . Ibuprofen     Racing heart  . Morphine And Related Itching    Review of Systems  Review of Systems  Constitutional: Negative for fever and malaise/fatigue.  HENT: Negative for congestion.   Eyes: Negative for discharge.  Respiratory: Negative for shortness of breath.   Cardiovascular: Negative for chest pain, palpitations and leg swelling.  Gastrointestinal: Negative for nausea, abdominal pain and diarrhea.  Genitourinary: Negative for dysuria.  Musculoskeletal: Negative for falls.  Skin: Negative for rash.  Neurological: Negative for loss of consciousness  and headaches.  Endo/Heme/Allergies: Negative for polydipsia.  Psychiatric/Behavioral: Negative for depression and suicidal ideas. The patient is not nervous/anxious and does not have insomnia.     Objective  BP 130/78  Pulse 113  Temp 99.6 F (37.6 C) (Temporal)  Ht 5\' 4"  (1.626 m)  Wt 197 lb 12.8 oz (89.721 kg)  BMI 33.95 kg/m2  SpO2 100%  LMP 05/21/2012  Physical Exam  Physical Exam  Constitutional: She is oriented to person, place, and time and well-developed, well-nourished, and in no distress. No distress.  HENT:  Head: Normocephalic and atraumatic.  Eyes: Conjunctivae normal are normal.  Neck: Neck supple. No thyromegaly present.  Cardiovascular: Normal rate, regular rhythm and normal heart sounds.   No murmur heard. Pulmonary/Chest: Effort normal and breath sounds normal. She has no wheezes.  Abdominal: She exhibits no distension and no mass.  Musculoskeletal: She exhibits no edema.  Lymphadenopathy:    She has no cervical adenopathy.  Neurological: She is alert and oriented to person, place, and time.  Skin: Skin is warm and dry. No rash noted. She is not diaphoretic.  Psychiatric: Memory, affect and judgment normal.    Lab Results  Component Value Date   TSH 1.21 04/27/2012   Lab Results  Component Value Date   WBC 5.3 04/27/2012   HGB 11.9* 04/27/2012   HCT 36.8 04/27/2012   MCV 87.6 04/27/2012   PLT 369.0 04/27/2012   Lab Results  Component Value Date   CREATININE 0.9 04/27/2012   BUN 15 04/27/2012   NA 140 04/27/2012   K 5.0 04/27/2012   CL 106 04/27/2012   CO2 26 04/27/2012   Lab Results  Component Value Date   ALT 11 04/27/2012   AST 21 04/27/2012   ALKPHOS 69 04/27/2012   BILITOT 0.2* 04/27/2012   Lab Results  Component Value Date   CHOL 163 07/17/2011   Lab Results  Component Value Date   HDL 81.90 07/17/2011   Lab Results  Component Value Date   LDLCALC 53 07/17/2011   Lab Results  Component Value Date   TRIG 140.0 07/17/2011    Lab Results  Component Value Date   CHOLHDL 2 07/17/2011     Assessment & Plan  Tachycardia Did not take her Metorprolol, will go pu from the pharmacy now  Anxiety and depression Has started counseling and feels that helps. Is tolerating the Venlafaxine but has needed more Alprazolam recently due to ongoing struggles with boyfriend and parents. Discussed at length the need not to overuse medications and to take as little as possible. She agrees to try and keep the number down understands that 40 tablet is going to be at least a 30 day supply and the HCT is less than a month that is better. She expresses understanding. We'll consider going  up on venlafaxine next month if she is not able to cut back on her alprazolam use  Hypokalemia Recheck renal today  Dysmenorrhea Uses Hydrocodone mostly for this, works well has also used it some this month for dental pain from some cracked teeth, encouraged to see a dentist and to use as little meds as possible. Drug testing done today  Anemia Mild, repeat CBC today for surveillance

## 2012-05-26 NOTE — Assessment & Plan Note (Signed)
Recheck renal today 

## 2012-05-26 NOTE — Assessment & Plan Note (Signed)
Uses Hydrocodone mostly for this, works well has also used it some this month for dental pain from some cracked teeth, encouraged to see a dentist and to use as little meds as possible. Drug testing done today

## 2012-05-26 NOTE — Assessment & Plan Note (Signed)
Did not take her Metorprolol, will go pu from the pharmacy now

## 2012-05-27 ENCOUNTER — Ambulatory Visit (INDEPENDENT_AMBULATORY_CARE_PROVIDER_SITE_OTHER): Payer: BC Managed Care – PPO | Admitting: Psychiatry

## 2012-05-27 DIAGNOSIS — F329 Major depressive disorder, single episode, unspecified: Secondary | ICD-10-CM

## 2012-05-27 DIAGNOSIS — F3289 Other specified depressive episodes: Secondary | ICD-10-CM

## 2012-05-27 LAB — PRESCRIPTION ABUSE MONITORING 10P, URINE
Amphetamine/Meth: NEGATIVE ng/mL
Buprenorphine, Urine: NEGATIVE ng/mL
Creatinine, Urine: 109.36 mg/dL (ref >20.0)
Oxycodone Screen, Ur: NEGATIVE ng/mL
Propoxyphene: NEGATIVE ng/mL

## 2012-05-29 ENCOUNTER — Ambulatory Visit: Payer: BC Managed Care – PPO | Admitting: Family Medicine

## 2012-05-29 LAB — OPIATES/OPIOIDS (LC/MS-MS)
Codeine Urine: NEGATIVE ng/mL
Heroin (6-AM), UR: NEGATIVE ng/mL
Hydrocodone: 67 ng/mL
Morphine Urine: NEGATIVE ng/mL

## 2012-05-29 LAB — BENZODIAZEPINES (GC/LC/MS), URINE
Clonazepam metabolite (GC/LC/MS), ur confirm: NEGATIVE ng/mL
Diazepam (GC/LC/MS), ur confirm: NEGATIVE ng/mL
Estazolam (GC/LC/MS), ur confirm: NEGATIVE ng/mL
Flunitrazepam metabolite (GC/LC/MS), ur confirm: NEGATIVE ng/mL
Lorazepam (GC/LC/MS), ur confirm: NEGATIVE ng/mL
Midazolam (GC/LC/MS), ur confirm: NEGATIVE ng/mL
Nordiazepam (GC/LC/MS), ur confirm: NEGATIVE ng/mL
Temazepam (GC/LC/MS), ur confirm: NEGATIVE ng/mL

## 2012-05-29 LAB — BARBITURATES (GC/LC/MS), URINE
Amobarbital: NEGATIVE ng/mL
Phenobarbital: NEGATIVE ng/mL
Secobarbital: NEGATIVE ng/mL

## 2012-06-17 ENCOUNTER — Ambulatory Visit (INDEPENDENT_AMBULATORY_CARE_PROVIDER_SITE_OTHER): Payer: BC Managed Care – PPO | Admitting: Psychiatry

## 2012-06-17 DIAGNOSIS — F329 Major depressive disorder, single episode, unspecified: Secondary | ICD-10-CM

## 2012-06-17 DIAGNOSIS — F410 Panic disorder [episodic paroxysmal anxiety] without agoraphobia: Secondary | ICD-10-CM

## 2012-06-26 ENCOUNTER — Ambulatory Visit (INDEPENDENT_AMBULATORY_CARE_PROVIDER_SITE_OTHER): Payer: BC Managed Care – PPO | Admitting: Family Medicine

## 2012-06-26 ENCOUNTER — Encounter: Payer: Self-pay | Admitting: Family Medicine

## 2012-06-26 VITALS — BP 109/73 | HR 94 | Temp 98.3°F | Ht 64.0 in | Wt 197.0 lb

## 2012-06-26 DIAGNOSIS — F341 Dysthymic disorder: Secondary | ICD-10-CM

## 2012-06-26 DIAGNOSIS — M549 Dorsalgia, unspecified: Secondary | ICD-10-CM

## 2012-06-26 DIAGNOSIS — F32A Depression, unspecified: Secondary | ICD-10-CM

## 2012-06-26 DIAGNOSIS — F411 Generalized anxiety disorder: Secondary | ICD-10-CM

## 2012-06-26 DIAGNOSIS — G4700 Insomnia, unspecified: Secondary | ICD-10-CM

## 2012-06-26 DIAGNOSIS — F419 Anxiety disorder, unspecified: Secondary | ICD-10-CM

## 2012-06-26 DIAGNOSIS — S161XXA Strain of muscle, fascia and tendon at neck level, initial encounter: Secondary | ICD-10-CM

## 2012-06-26 DIAGNOSIS — S139XXA Sprain of joints and ligaments of unspecified parts of neck, initial encounter: Secondary | ICD-10-CM

## 2012-06-26 DIAGNOSIS — R000 Tachycardia, unspecified: Secondary | ICD-10-CM

## 2012-06-26 DIAGNOSIS — H6120 Impacted cerumen, unspecified ear: Secondary | ICD-10-CM

## 2012-06-26 DIAGNOSIS — F329 Major depressive disorder, single episode, unspecified: Secondary | ICD-10-CM

## 2012-06-26 MED ORDER — METHOCARBAMOL 500 MG PO TABS: 500.0000 mg | ORAL_TABLET | Freq: Three times a day (TID) | ORAL | Status: DC | PRN

## 2012-06-26 MED ORDER — ALPRAZOLAM 1 MG PO TABS: 1.0000 mg | ORAL_TABLET | Freq: Two times a day (BID) | ORAL | Status: DC | PRN

## 2012-06-26 MED ORDER — NEOMYCIN-POLYMYXIN-HC 3.5-10000-1 OT SOLN: 3.0000 [drp] | Freq: Three times a day (TID) | OTIC | Status: DC

## 2012-06-26 MED ORDER — VENLAFAXINE HCL ER 225 MG PO TB24: 225.0000 mg | ORAL_TABLET | Freq: Every day | ORAL | Status: DC

## 2012-06-26 NOTE — Patient Instructions (Addendum)
Cervical Sprain  A cervical sprain is when the ligaments in the neck stretch or tear. The ligaments are the tissues that hold the neck bones in place.  HOME CARE    Put ice on the injured area.   Put ice in a plastic bag.   Place a towel between your skin and the bag.   Leave the ice on for 15 to 20 minutes, 3 to 4 times a day.   Only take medicine as told by your doctor.   Keep all doctor visits as told.   Keep all physical therapy visits as told.   If your doctor gives you a neck collar, wear it as told.   Do not drive while wearing a neck collar.   Adjust your work station so that you have good posture while you work.   Avoid positions and activities that make your problems worse.   Warm up and stretch before being active.  GET HELP RIGHT AWAY IF:    You are bleeding or your stomach is upset.   You have an allergic reaction to your medicine.   Your problems (symptoms) get worse.   You develop new problems.   You lose feeling (numbness) or you cannot move (paralysis) any part of your body.   You have tingling or weakness in any part of your body.   Your pain is not controlled with medicine.   You cannot take less pain medicine over time as planned.   Your activity level does not improve as expected.  MAKE SURE YOU:    Understand these instructions.   Will watch your condition.   Will get help right away if you are not doing well or get worse.  Document Released: 11/13/2007 Document Revised: 08/19/2011 Document Reviewed: 02/28/2011  [employer] Patient Information 2013 [employer]

## 2012-06-28 ENCOUNTER — Encounter: Payer: Self-pay | Admitting: Family Medicine

## 2012-06-28 DIAGNOSIS — H6120 Impacted cerumen, unspecified ear: Secondary | ICD-10-CM | POA: Insufficient documentation

## 2012-06-28 DIAGNOSIS — S161XXA Strain of muscle, fascia and tendon at neck level, initial encounter: Secondary | ICD-10-CM | POA: Insufficient documentation

## 2012-06-28 HISTORY — DX: Strain of muscle, fascia and tendon at neck level, initial encounter: S16.1XXA

## 2012-06-28 NOTE — Assessment & Plan Note (Signed)
Muscle strain given a muscle relaxer and encouraged moist heat and gentle stretching.

## 2012-06-28 NOTE — Assessment & Plan Note (Signed)
Tolerates disimpaction well. Otitis externa noted. Given Cortisporin otic drops to use as directed.

## 2012-06-28 NOTE — Assessment & Plan Note (Signed)
Generally improved, improved with recheck

## 2012-06-28 NOTE — Assessment & Plan Note (Signed)
Patient tolerating pain with prn meds, encouraged exercise.

## 2012-06-28 NOTE — Progress Notes (Signed)
Patient ID: Kathryn Randall, female   DOB: 08-Jun-1991, 22 y.o.   MRN: [id number] Kathryn Randall [id number] July 14, 1990 06/28/2012      Progress Note-Follow Up  Subjective  Chief Complaint  Chief Complaint  Patient presents with  . Follow-up    1 month  . Cerumen Impaction    left ear    HPI  Patient is a 22 Caucasian female who is in today for followup. Her ears been feeling plugged and itchy recently but no severe pain. Does have a decreased sense of hearing in that ear recently. No fevers or chills. No congestion. No chest pain, palpitations, shortness of breath. It is struggling with increased anxiety but overall feels she's doing well. She's recently brother with her boyfriend and is returning to school and feeling good about that. Relations with her family it improves somewhat. Does still struggle some anhedonia. No suicidal ideation. Didn't fall recently when her ankle buckled and she's had a sore neck especially on the right side off and on since then.  Past Medical History  Diagnosis Date  . Anxiety   . Depression   . Back pain   . Migraine 06/28/2011  . Allergic state 06/28/2011  . Preventative health care 06/28/2011  . Anxiety and depression 07/17/2011  . UTI (lower urinary tract infection) 08/18/2011  . Tachycardia 11/14/2011  . Anemia 11/14/2011  . Hypokalemia 11/14/2011  . Overweight 04/27/2012  . Dysmenorrhea 05/26/2012    Past Surgical History  Procedure Date  . Wisdom tooth extraction 22 yrs old    Family History  Problem Relation Age of Onset  . Migraines Mother   . Migraines Father   . Obesity Maternal Grandmother   . Heart disease Maternal Grandmother     CHF  . Hypertension Maternal Grandmother   . Diabetes Maternal Grandfather     type 2  . Obesity Maternal Grandfather   . Leukemia Paternal Grandfather   . Migraines Paternal Grandfather     History   Social History  . Marital Status: Single    Spouse Name: N/A    Number of Children: N/A  .  Years of Education: N/A   Occupational History  . Not on file.   Social History Main Topics  . Smoking status: Never Smoker   . Smokeless tobacco: Never Used     Comment: black and milds every once in awhile  . Alcohol Use: No  . Drug Use: No  . Sexually Active: Not Currently -- Female partner(s)   Other Topics Concern  . Not on file   Social History Narrative  . No narrative on file    Current Outpatient Prescriptions on File Prior to Visit  Medication Sig Dispense Refill  . Drospirenone-Ethinyl Estradiol-Levomefol (BEYAZ) 3-0.02-0.451 MG tablet Take 1 tablet by mouth daily.  3 Package  1  . HYDROcodone-acetaminophen (NORCO) 7.5-325 MG per tablet Take 1 tablet by mouth every 8 (eight) hours as needed for pain.  60 tablet  1  . promethazine (PHENERGAN) 25 MG tablet Take 1 tablet (25 mg total) by mouth every 8 (eight) hours as needed.  40 tablet  2  . meloxicam (MOBIC) 15 MG tablet Take 1 tablet (15 mg total) by mouth daily as needed for pain. With food  30 tablet  3  . metoprolol tartrate (LOPRESSOR) 25 MG tablet Take 1 tablet (25 mg total) by mouth 2 (two) times daily.  60 tablet  6  . Venlafaxine HCl 225 MG TB24 Take 1 tablet (225  mg total) by mouth daily.  30 each  1    Allergies  Allergen Reactions  . Ibuprofen     Racing heart  . Morphine And Related Itching    Review of Systems  Review of Systems  Constitutional: Negative for fever and malaise/fatigue.  HENT: Positive for hearing loss. Negative for congestion.        On left with obstruction with itching.  Eyes: Negative for discharge.  Respiratory: Negative for shortness of breath.   Cardiovascular: Negative for chest pain, palpitations and leg swelling.  Gastrointestinal: Negative for nausea, abdominal pain and diarrhea.  Genitourinary: Negative for dysuria.  Musculoskeletal: Negative for falls.  Skin: Negative for rash.  Neurological: Negative for loss of consciousness and headaches.  Endo/Heme/Allergies:  Negative for polydipsia.  Psychiatric/Behavioral: Positive for depression. Negative for suicidal ideas. The patient is nervous/anxious. The patient does not have insomnia.     Objective  BP 109/73  Pulse 94  Temp 98.3 F (36.8 C) (Temporal)  Ht 5\' 4"  (1.626 m)  Wt 197 lb (89.359 kg)  BMI 33.82 kg/m2  SpO2 98%  LMP 06/10/2012  Physical Exam  Physical Exam  Constitutional: She is oriented to person, place, and time and well-developed, well-nourished, and in no distress. No distress.  HENT:  Head: Normocephalic and atraumatic.       Left TM obscured initially with cerumen, cleared easily, skin in external canal is erythematous and swollen  Eyes: Conjunctivae normal are normal.  Neck: Neck supple. No thyromegaly present.  Cardiovascular: Normal rate, regular rhythm and normal heart sounds.   No murmur heard. Pulmonary/Chest: Effort normal and breath sounds normal. She has no wheezes.  Abdominal: She exhibits no distension and no mass.  Musculoskeletal: She exhibits no edema.  Lymphadenopathy:    She has no cervical adenopathy.  Neurological: She is alert and oriented to person, place, and time.  Skin: Skin is warm and dry. No rash noted. She is not diaphoretic.  Psychiatric: Memory, affect and judgment normal.    Lab Results  Component Value Date   TSH 1.21 04/27/2012   Lab Results  Component Value Date   WBC 7.1 05/26/2012   HGB 11.9* 05/26/2012   HCT 36.1 05/26/2012   MCV 87.3 05/26/2012   PLT 379.0 05/26/2012   Lab Results  Component Value Date   CREATININE 0.7 05/26/2012   BUN 12 05/26/2012   NA 140 05/26/2012   K 3.8 05/26/2012   CL 105 05/26/2012   CO2 24 05/26/2012   Lab Results  Component Value Date   ALT 11 04/27/2012   AST 21 04/27/2012   ALKPHOS 69 04/27/2012   BILITOT 0.2* 04/27/2012   Lab Results  Component Value Date   CHOL 163 07/17/2011   Lab Results  Component Value Date   HDL 81.90 07/17/2011   Lab Results  Component Value Date    LDLCALC 53 07/17/2011   Lab Results  Component Value Date   TRIG 140.0 07/17/2011   Lab Results  Component Value Date   CHOLHDL 2 07/17/2011     Assessment & Plan  Cerumen impaction Tolerates disimpaction well. Otitis externa noted. Given Cortisporin otic drops to use as directed.  Anxiety and depression Is doing better on Venlafaxine but still struggling, increase Venlafaxine to 225 mg daily reassess at next visit.   Tachycardia Generally improved, improved with recheck  Back pain Patient tolerating pain with prn meds, encouraged exercise.   Neck strain Muscle strain given a muscle relaxer and encouraged moist  heat and gentle stretching.

## 2012-06-28 NOTE — Assessment & Plan Note (Signed)
Is doing better on Venlafaxine but still struggling, increase Venlafaxine to 225 mg daily reassess at next visit.

## 2012-07-01 ENCOUNTER — Ambulatory Visit: Payer: BC Managed Care – PPO | Admitting: Psychiatry

## 2012-07-08 ENCOUNTER — Ambulatory Visit: Payer: BC Managed Care – PPO | Admitting: Psychiatry

## 2012-07-17 ENCOUNTER — Ambulatory Visit: Payer: Self-pay | Admitting: Family

## 2012-07-17 ENCOUNTER — Other Ambulatory Visit: Payer: BC Managed Care – PPO

## 2012-07-17 ENCOUNTER — Telehealth: Payer: Self-pay | Admitting: Family Medicine

## 2012-07-17 ENCOUNTER — Telehealth: Payer: Self-pay | Admitting: *Deleted

## 2012-07-17 DIAGNOSIS — R300 Dysuria: Secondary | ICD-10-CM

## 2012-07-17 DIAGNOSIS — R3915 Urgency of urination: Secondary | ICD-10-CM

## 2012-07-17 MED ORDER — CIPROFLOXACIN HCL 500 MG PO TABS: 500.0000 mg | ORAL_TABLET | Freq: Two times a day (BID) | ORAL | Status: DC

## 2012-07-17 NOTE — Telephone Encounter (Signed)
Verbal per md send in Cipro 500 bid X 3 days and if pt gets worse she must come in.

## 2012-07-17 NOTE — Telephone Encounter (Signed)
PC from pt this AM, stating she woke up this AM in "severe, severe, severe pain", pain is in female area.  All she can do is lay in bed in the fetal position.  Urgency this morning that patient was unaware of and pt states she actually peed on herself.  Denies any fever or hematuria.  No frequency, but must go urgently.  Pt would like meds called in.  She states she has UTI's all the time. [employer]

## 2012-07-17 NOTE — Telephone Encounter (Signed)
Message left for patient to return call.  Advised we need sample, but she does not need to see MD.

## 2012-07-17 NOTE — Telephone Encounter (Signed)
Patient Information:  Caller Name: [name]  Phone: [phone]  Patient: Kathryn, Randall  Gender: Female  DOB: Aug 08, 1990  Age: 22 Years  PCP: [facility])  Pregnant: No  Office Follow Up:  Does the office need to follow up with this patient?: No  Instructions For The Office: N/A   Symptoms  Reason For Call & Symptoms: Having lower abdominal pain with urinary incontinence x2 this am.  Reviewed Health History In EMR: Yes  Reviewed Medications In EMR: Yes  Reviewed Allergies In EMR: Yes  Reviewed Surgeries / Procedures: Yes  Date of Onset of Symptoms: 07/17/2012 OB / GYN:  LMP: 07/07/2012  Guideline(s) Used:  Urination Pain - Female  Disposition Per Guideline:   See Today in Office  Reason For Disposition Reached:   > 2 UTIs in last year  Advice Given:  N/A  Appointment Scheduled:  07/17/2012 11:00:00 Appointment Scheduled Provider:  Sandford Craze (Adults only)

## 2012-07-17 NOTE — Telephone Encounter (Signed)
She can have an antibiotic but I need her to provide [place] with a sample so I can make sure she does not have a resistent infection then I can call it in

## 2012-07-17 NOTE — Telephone Encounter (Signed)
Patient will "try to" come in to give urine sample. Patient advised has to arrive at office by 4:40. Offered Saturday appt. Patient declined, said she has to work in the morning.

## 2012-07-27 ENCOUNTER — Other Ambulatory Visit: Payer: Self-pay | Admitting: Family Medicine

## 2012-07-27 NOTE — Telephone Encounter (Signed)
Please advise refill? Last RX was wrote on 05-26-12 quantity 60 with 1 refill.  If ok fax to [phone]

## 2012-07-31 ENCOUNTER — Ambulatory Visit (INDEPENDENT_AMBULATORY_CARE_PROVIDER_SITE_OTHER): Payer: BC Managed Care – PPO | Admitting: Family Medicine

## 2012-07-31 ENCOUNTER — Encounter: Payer: Self-pay | Admitting: Family Medicine

## 2012-07-31 VITALS — BP 127/82 | HR 79 | Temp 98.5°F | Ht 64.0 in | Wt 195.0 lb

## 2012-07-31 DIAGNOSIS — F329 Major depressive disorder, single episode, unspecified: Secondary | ICD-10-CM

## 2012-07-31 DIAGNOSIS — R000 Tachycardia, unspecified: Secondary | ICD-10-CM

## 2012-07-31 DIAGNOSIS — Z309 Encounter for contraceptive management, unspecified: Secondary | ICD-10-CM

## 2012-07-31 DIAGNOSIS — F341 Dysthymic disorder: Secondary | ICD-10-CM

## 2012-07-31 DIAGNOSIS — G43909 Migraine, unspecified, not intractable, without status migrainosus: Secondary | ICD-10-CM

## 2012-07-31 DIAGNOSIS — M549 Dorsalgia, unspecified: Secondary | ICD-10-CM

## 2012-07-31 MED ORDER — METOPROLOL TARTRATE 50 MG PO TABS: 50.0000 mg | ORAL_TABLET | Freq: Two times a day (BID) | ORAL | Status: DC

## 2012-07-31 MED ORDER — RIZATRIPTAN BENZOATE 10 MG PO TABS: 10.0000 mg | ORAL_TABLET | ORAL | Status: DC | PRN

## 2012-07-31 MED ORDER — DROSPIREN-ETH ESTRAD-LEVOMEFOL 3-0.02-0.451 MG PO TABS: 1.0000 | ORAL_TABLET | Freq: Every day | ORAL | Status: DC

## 2012-07-31 MED ORDER — HYDROCODONE-ACETAMINOPHEN 7.5-325 MG PO TABS: 1.0000 | ORAL_TABLET | Freq: Three times a day (TID) | ORAL | Status: DC | PRN

## 2012-07-31 NOTE — Patient Instructions (Addendum)
Nonspecific Tachycardia  Tachycardia is a faster than normal heartbeat (more than 100 beats per minute). In adults, the heart normally beats between 60 and 100 times a minute. A fast heartbeat may be a normal response to exercise or stress. It does not necessarily mean that something is wrong. However, sometimes when your heart beats too fast it may not be able to pump enough blood to the rest of your body. This can result in chest pain, shortness of breath, dizziness, and even fainting. Nonspecific tachycardia means that the specific cause or pattern of your tachycardia is unknown.  CAUSES   Tachycardia may be harmless or it may be due to a more serious underlying cause. Possible causes of tachycardia include:   Exercise or exertion.   Fever.   Pain or injury.   Infection.   Loss of body fluids (dehydration).   Overactive thyroid.   Lack of red blood cells (anemia).   Anxiety and stress.   Alcohol.   Caffeine.   Tobacco products.   Diet pills.   Illegal drugs.   Heart disease.  SYMPTOMS   Rapid or irregular heartbeat (palpitations).   Suddenly feeling your heart beating (cardiac awareness).   Dizziness.   Tiredness (fatigue).   Shortness of breath.   Chest pain.   Nausea.   Fainting.  DIAGNOSIS   Your caregiver will perform a physical exam and take your medical history. In some cases, a heart specialist (cardiologist) may be consulted. Your caregiver may also order:   Blood tests.   Electrocardiography. This test records the electrical activity of your heart.   A heart monitoring test.  TREATMENT   Treatment will depend on the likely cause of your tachycardia. The goal is to treat the underlying cause of your tachycardia. Treatment methods may include:   Replacement of fluids or blood through an intravenous (IV) tube for moderate to severe dehydration or anemia.   New medicines or changes in your current medicines.   Diet and lifestyle changes.   Treatment for certain  infections.   Stress relief or relaxation methods.  HOME CARE INSTRUCTIONS    Rest.   Drink enough fluids to keep your urine clear or pale yellow.   Do not smoke.   Avoid:   Caffeine.   Tobacco.   Alcohol.   Chocolate.   Stimulants such as over-the-counter diet pills or pills that help you stay awake.   Situations that cause anxiety or stress.   Illegal drugs such as marijuana, phencyclidine (PCP), and cocaine.   Only take medicine as directed by your caregiver.   Keep all follow-up appointments as directed by your caregiver.  SEEK IMMEDIATE MEDICAL CARE IF:    You have pain in your chest, upper arms, jaw, or neck.   You become weak, dizzy, or feel faint.   You have palpitations that will not go away.   You vomit, have diarrhea, or pass blood in your stool.   Your skin is cool, pale, and wet.   You have a fever that will not go away with rest, fluids, and medicine.  MAKE SURE YOU:    Understand these instructions.   Will watch your condition.   Will get help right away if you are not doing well or get worse.  Document Released: 07/04/2004 Document Revised: 08/19/2011 Document Reviewed: 05/07/2011  [employer] Patient Information 2013 [employer].

## 2012-08-02 ENCOUNTER — Encounter: Payer: Self-pay | Admitting: Family Medicine

## 2012-08-02 NOTE — Progress Notes (Signed)
Patient ID: Kathryn Randall, female   DOB: [birth year], 22 y.o.   MRN: [id number] Kathryn Randall [id number] 01/26/1991 08/02/2012      Progress Note-Follow Up  Subjective  Chief Complaint  Chief Complaint  Patient presents with  . Follow-up    HPI  22 year old Caucasian female who is in today for followup. Overall she's doing well. Her venlafaxine continues to help her depression and anxiety. Her back pain is tolerable and she is using pain meds intermittently. She has had some recent headaches. Had a migraine and Relpax with reasonable effect. Had photophobia and phonophobia and the headache lasted for many days. No fevers or chills. No congestion, chest pain, palpitations, shortness of breath, GI or GU complaints noted today.  Past Medical History  Diagnosis Date  . Anxiety   . Depression   . Back pain   . Migraine 06/28/2011  . Allergic state 06/28/2011  . Preventative health care 06/28/2011  . Anxiety and depression 07/17/2011  . UTI (lower urinary tract infection) 08/18/2011  . Tachycardia 11/14/2011  . Anemia 11/14/2011  . Hypokalemia 11/14/2011  . Overweight 04/27/2012  . Dysmenorrhea 05/26/2012  . Neck strain 06/28/2012    Past Surgical History  Procedure Laterality Date  . Wisdom tooth extraction  22 yrs old    Family History  Problem Relation Age of Onset  . Migraines Mother   . Migraines Father   . Obesity Maternal Grandmother   . Heart disease Maternal Grandmother     CHF  . Hypertension Maternal Grandmother   . Diabetes Maternal Grandfather     type 2  . Obesity Maternal Grandfather   . Leukemia Paternal Grandfather   . Migraines Paternal Grandfather     History   Social History  . Marital Status: Single    Spouse Name: N/A    Number of Children: N/A  . Years of Education: N/A   Occupational History  . Not on file.   Social History Main Topics  . Smoking status: Never Smoker   . Smokeless tobacco: Never Used     Comment: black and milds every  once in awhile  . Alcohol Use: No  . Drug Use: No  . Sexually Active: Not Currently -- Female partner(s)   Other Topics Concern  . Not on file   Social History Narrative  . No narrative on file    Current Outpatient Prescriptions on File Prior to Visit  Medication Sig Dispense Refill  . ALPRAZolam (XANAX) 1 MG tablet Take 1 tablet (1 mg total) by mouth 2 (two) times daily as needed for sleep or anxiety.  40 tablet  1  . meloxicam (MOBIC) 15 MG tablet Take 1 tablet (15 mg total) by mouth daily as needed for pain. With food  30 tablet  3  . methocarbamol (ROBAXIN) 500 MG tablet Take 1 tablet (500 mg total) by mouth 3 (three) times daily as needed.  90 tablet  0  . promethazine (PHENERGAN) 25 MG tablet Take 1 tablet (25 mg total) by mouth every 8 (eight) hours as needed.  40 tablet  2  . Venlafaxine HCl 225 MG TB24 Take 1 tablet (225 mg total) by mouth daily.  30 each  1  . ciprofloxacin (CIPRO) 500 MG tablet Take 1 tablet (500 mg total) by mouth 2 (two) times daily. X 3 days  6 tablet  0  . neomycin-polymyxin-hydrocortisone (CORTISPORIN) otic solution Place 3 drops into the left ear 3 (three) times daily. X 10 days  10 mL  0   No current facility-administered medications on file prior to visit.    Allergies  Allergen Reactions  . Ibuprofen     Racing heart  . Morphine And Related Itching    Review of Systems  Review of Systems  Constitutional: Negative for fever and malaise/fatigue.  HENT: Negative for congestion.   Eyes: Negative for discharge.  Respiratory: Negative for shortness of breath.   Cardiovascular: Negative for chest pain, palpitations and leg swelling.  Gastrointestinal: Negative for nausea, abdominal pain and diarrhea.  Genitourinary: Negative for dysuria.  Musculoskeletal: Negative for falls.  Skin: Negative for rash.  Neurological: Positive for headaches. Negative for loss of consciousness.  Endo/Heme/Allergies: Negative for polydipsia.   Psychiatric/Behavioral: Negative for depression and suicidal ideas. The patient is nervous/anxious. The patient does not have insomnia.     Objective  BP 127/82  Pulse 79  Temp(Src) 98.5 F (36.9 C) (Oral)  Ht 5\' 4"  (1.626 m)  Wt 195 lb (88.451 kg)  BMI 33.46 kg/m2  SpO2 96%  LMP 07/27/2012  Physical Exam  Physical Exam  Constitutional: She is oriented to person, place, and time and well-developed, well-nourished, and in no distress. No distress.  HENT:  Head: Normocephalic and atraumatic.  Eyes: Conjunctivae are normal.  Neck: Neck supple. No thyromegaly present.  Cardiovascular: Normal rate, regular rhythm and normal heart sounds.   No murmur heard. Pulmonary/Chest: Effort normal and breath sounds normal. She has no wheezes.  Abdominal: She exhibits no distension and no mass.  Musculoskeletal: She exhibits no edema.  Lymphadenopathy:    She has no cervical adenopathy.  Neurological: She is alert and oriented to person, place, and time.  Skin: Skin is warm and dry. No rash noted. She is not diaphoretic.  Psychiatric: Memory, affect and judgment normal.    Lab Results  Component Value Date   TSH 1.21 04/27/2012   Lab Results  Component Value Date   WBC 7.1 05/26/2012   HGB 11.9* 05/26/2012   HCT 36.1 05/26/2012   MCV 87.3 05/26/2012   PLT 379.0 05/26/2012   Lab Results  Component Value Date   CREATININE 0.7 05/26/2012   BUN 12 05/26/2012   NA 140 05/26/2012   K 3.8 05/26/2012   CL 105 05/26/2012   CO2 24 05/26/2012   Lab Results  Component Value Date   ALT 11 04/27/2012   AST 21 04/27/2012   ALKPHOS 69 04/27/2012   BILITOT 0.2* 04/27/2012   Lab Results  Component Value Date   CHOL 163 07/17/2011   Lab Results  Component Value Date   HDL 81.90 07/17/2011   Lab Results  Component Value Date   LDLCALC 53 07/17/2011   Lab Results  Component Value Date   TRIG 140.0 07/17/2011   Lab Results  Component Value Date   CHOLHDL 2 07/17/2011      Assessment & Plan  Migraine Recently tried Relpax and found it helpful. Given rx for Maxalt to try. Encouraged adequate sleep and good po intake.   Tachycardia Well controlled, continue beta blockade  Anxiety and depression Doing well on Effexor.   Back pain Continue current meds prn and attempt weight loss

## 2012-08-02 NOTE — Assessment & Plan Note (Signed)
Doing well on Effexor.

## 2012-08-02 NOTE — Assessment & Plan Note (Signed)
Recently tried Relpax and found it helpful. Given rx for Maxalt to try. Encouraged adequate sleep and good po intake.

## 2012-08-02 NOTE — Assessment & Plan Note (Signed)
Continue current meds prn and attempt weight loss

## 2012-08-02 NOTE — Assessment & Plan Note (Signed)
Well controlled, continue beta blockade

## 2012-08-11 ENCOUNTER — Other Ambulatory Visit: Payer: Self-pay | Admitting: Family Medicine

## 2012-08-11 NOTE — Telephone Encounter (Signed)
Please advise Alprazolam refill? Last RX was wrote on 06-26-12 quantity 40 with 1 refill?  If ok fax to [phone]

## 2012-08-11 NOTE — Telephone Encounter (Signed)
She is not due yet, can have next week unless she wants to come in early and help [place] document why she has been taking it more

## 2012-08-12 ENCOUNTER — Other Ambulatory Visit: Payer: Self-pay | Admitting: Family Medicine

## 2012-08-13 ENCOUNTER — Other Ambulatory Visit: Payer: Self-pay | Admitting: Family Medicine

## 2012-08-13 NOTE — Telephone Encounter (Signed)
Please advise re: request below:  Medication name:  Name from pharmacy:  ALPRAZolam (XANAX) 1 MG tablet  ALPRAZOLAM 1 MG TABLET Sig: TAKE 1 TABLET BY MOUTH TWICE A DAY AS NEEDED FOR SLEEP OR ANXIETY Dispense: 40 tablet Refills: 1 Start: 08/12/2012 Class: Normal Requested on: 06/26/2012 Originally ordered on: 05/26/2012 Last refill: 07/16/2012

## 2012-09-04 ENCOUNTER — Other Ambulatory Visit: Payer: Self-pay | Admitting: Family Medicine

## 2012-09-04 NOTE — Telephone Encounter (Signed)
Faxed RX 

## 2012-09-15 ENCOUNTER — Other Ambulatory Visit: Payer: Self-pay | Admitting: Family Medicine

## 2012-09-15 NOTE — Telephone Encounter (Signed)
Last RX was 07-31-12 quantity 60 with 1 refill.  Pt should not need refill until 09-28-12

## 2012-09-18 ENCOUNTER — Other Ambulatory Visit: Payer: Self-pay | Admitting: Family Medicine

## 2012-09-18 NOTE — Telephone Encounter (Signed)
Please advise refill? Last RX was wrote on 07-31-12 quantity 60 with 1 refill.  If ok fax to [phone]

## 2012-09-28 ENCOUNTER — Ambulatory Visit (INDEPENDENT_AMBULATORY_CARE_PROVIDER_SITE_OTHER): Payer: BC Managed Care – PPO | Admitting: Family Medicine

## 2012-09-28 ENCOUNTER — Encounter: Payer: Self-pay | Admitting: Family Medicine

## 2012-09-28 VITALS — BP 110/70 | HR 80 | Temp 98.4°F | Ht 64.0 in | Wt 193.5 lb

## 2012-09-28 DIAGNOSIS — R000 Tachycardia, unspecified: Secondary | ICD-10-CM

## 2012-09-28 DIAGNOSIS — F341 Dysthymic disorder: Secondary | ICD-10-CM

## 2012-09-28 DIAGNOSIS — M549 Dorsalgia, unspecified: Secondary | ICD-10-CM

## 2012-09-28 DIAGNOSIS — G4700 Insomnia, unspecified: Secondary | ICD-10-CM

## 2012-09-28 DIAGNOSIS — F419 Anxiety disorder, unspecified: Secondary | ICD-10-CM

## 2012-09-28 MED ORDER — QUETIAPINE FUMARATE 100 MG PO TABS: ORAL_TABLET | ORAL | Status: DC

## 2012-09-28 MED ORDER — DESVENLAFAXINE SUCCINATE ER 50 MG PO TB24: 50.0000 mg | ORAL_TABLET | Freq: Every day | ORAL | Status: DC

## 2012-09-28 MED ORDER — METHOCARBAMOL 500 MG PO TABS: 500.0000 mg | ORAL_TABLET | Freq: Three times a day (TID) | ORAL | Status: DC | PRN

## 2012-09-28 MED ORDER — ALPRAZOLAM 1 MG PO TABS: 1.0000 mg | ORAL_TABLET | Freq: Two times a day (BID) | ORAL | Status: DC | PRN

## 2012-09-28 NOTE — Patient Instructions (Addendum)
[facility] for consideration/testing of ADHD  Attention Deficit Hyperactivity Disorder Attention deficit hyperactivity disorder (ADHD) is a problem with behavior issues based on the way the brain functions (neurobehavioral disorder). It is a common reason for behavior and academic problems in school. CAUSES  The cause of ADHD is unknown in most cases. It may run in families. It sometimes can be associated with learning disabilities and other behavioral problems. SYMPTOMS  There are 3 types of ADHD. The 3 types and some of the symptoms include:  Inattentive  Gets bored or distracted easily.  Loses or forgets things. Forgets to hand in homework.  Has trouble organizing or completing tasks.  Difficulty staying on task.  An inability to organize daily tasks and school work.  Leaving projects, chores, or homework unfinished.  Trouble paying attention or responding to details. Careless mistakes.  Difficulty following directions. Often seems like is not listening.  Dislikes activities that require sustained attention (like chores or homework).  Hyperactive-impulsive  Feels like it is impossible to sit still or stay in a seat. Fidgeting with hands and feet.  Trouble waiting turn.  Talking too much or out of turn. Interruptive.  Speaks or acts impulsively.  Aggressive, disruptive behavior.  Constantly busy or on the go, noisy.  Combined  Has symptoms of both of the above. Often children with ADHD feel discouraged about themselves and with school. They often perform well below their abilities in school. These symptoms can cause problems in home, school, and in relationships with peers. As children get older, the excess motor activities can calm down, but the problems with paying attention and staying organized persist. Most children do not outgrow ADHD but with good treatment can learn to cope with the symptoms. DIAGNOSIS  When ADHD is suspected, the  diagnosis should be made by professionals trained in ADHD.  Diagnosis will include:  Ruling out other reasons for the child's behavior.  The caregivers will check with the child's school and check their medical records.  They will talk to teachers and parents.  Behavior rating scales for the child will be filled out by those dealing with the child on a daily basis. A diagnosis is made only after all information has been considered. TREATMENT  Treatment usually includes behavioral treatment often along with medicines. It may include stimulant medicines. The stimulant medicines decrease impulsivity and hyperactivity and increase attention. Other medicines used include antidepressants and certain blood pressure medicines. Most experts agree that treatment for ADHD should address all aspects of the child's functioning. Treatment should not be limited to the use of medicines alone. Treatment should include structured classroom management. The parents must receive education to address rewarding good behavior, discipline, and limit-setting. Tutoring or behavioral therapy or both should be available for the child. If untreated, the disorder can have long-term serious effects into adolescence and adulthood. HOME CARE INSTRUCTIONS   Often with ADHD there is a lot of frustration among the family in dealing with the illness. There is often blame and anger that is not warranted. This is a life long illness. There is no way to prevent ADHD. In many cases, because the problem affects the family as a whole, the entire family may need help. A therapist can help the family find better ways to handle the disruptive behaviors and promote change. If the child is young, most of the therapist's work is with the parents. Parents will learn techniques for coping with and improving their child's behavior. Sometimes only the child  with the ADHD needs counseling. Your caregivers can help you make these decisions.  Children  with ADHD may need help in organizing. Some helpful tips include:  Keep routines the same every day from wake-up time to bedtime. Schedule everything. This includes homework and playtime. This should include outdoor and indoor recreation. Keep the schedule on the refrigerator or a bulletin board where it is frequently seen. Mark schedule changes as far in advance as possible.  Have a place for everything and keep everything in its place. This includes clothing, backpacks, and school supplies.  Encourage writing down assignments and bringing home needed books.  Offer your child a well-balanced diet. Breakfast is especially important for school performance. Children should avoid drinks with caffeine including:  Soft drinks.  Coffee.  Tea.  However, some older children (adolescents) may find these drinks helpful in improving their attention.  Children with ADHD need consistent rules that they can understand and follow. If rules are followed, give small rewards. Children with ADHD often receive, and expect, criticism. Look for good behavior and praise it. Set realistic goals. Give clear instructions. Look for activities that can foster success and self-esteem. Make time for pleasant activities with your child. Give lots of affection.  Parents are their children's greatest advocates. Learn as much as possible about ADHD. This helps you become a stronger and better advocate for your child. It also helps you educate your child's teachers and instructors if they feel inadequate in these areas. Parent support groups are often helpful. A national group with local chapters is called CHADD (Children and Adults with Attention Deficit Hyperactivity Disorder). PROGNOSIS  There is no cure for ADHD. Children with the disorder seldom outgrow it. Many find adaptive ways to accommodate the ADHD as they mature. SEEK MEDICAL CARE IF:  Your child has repeated muscle twitches, cough or speech outbursts.  Your  child has sleep problems.  Your child has a marked loss of appetite.  Your child develops depression.  Your child has new or worsening behavioral problems.  Your child develops dizziness.  Your child has a racing heart.  Your child has stomach pains.  Your child develops headaches. Document Released: 05/17/2002 Document Revised: 08/19/2011 Document Reviewed: 12/28/2007 [facility] Patient Information 2013 [place], [place]. Insomnia Insomnia is frequent trouble falling and/or staying asleep. Insomnia can be a long term problem or a short term problem. Both are common. Insomnia can be a short term problem when the wakefulness is related to a certain stress or worry. Long term insomnia is often related to ongoing stress during waking hours and/or poor sleeping habits. Overtime, sleep deprivation itself can make the problem worse. Every little thing feels more severe because you are overtired and your ability to cope is decreased. CAUSES   Stress, anxiety, and depression.  Poor sleeping habits.  Distractions such as TV in the bedroom.  Naps close to bedtime.  Engaging in emotionally charged conversations before bed.  Technical reading before sleep.  Alcohol and other sedatives. They may make the problem worse. They can hurt normal sleep patterns and normal dream activity.  Stimulants such as caffeine for several hours prior to bedtime.  Pain syndromes and shortness of breath can cause insomnia.  Exercise late at night.  Changing time zones may cause sleeping problems (jet lag). It is sometimes helpful to have someone observe your sleeping patterns. They should look for periods of not breathing during the night (sleep apnea). They should also look to see how long those periods last. If  you live alone or observers are uncertain, you can also be observed at a sleep clinic where your sleep patterns will be professionally monitored. Sleep apnea requires a checkup and treatment. Give your  caregivers your medical history. Give your caregivers observations your family has made about your sleep.  SYMPTOMS   Not feeling rested in the morning.  Anxiety and restlessness at bedtime.  Difficulty falling and staying asleep. TREATMENT   Your caregiver may prescribe treatment for an underlying medical disorders. Your caregiver can give advice or help if you are using alcohol or other drugs for self-medication. Treatment of underlying problems will usually eliminate insomnia problems.  Medications can be prescribed for short time use. They are generally not recommended for lengthy use.  Over-the-counter sleep medicines are not recommended for lengthy use. They can be habit forming.  You can promote easier sleeping by making lifestyle changes such as:  Using relaxation techniques that help with breathing and reduce muscle tension.  Exercising earlier in the day.  Changing your diet and the time of your last meal. No night time snacks.  Establish a regular time to go to bed.  Counseling can help with stressful problems and worry.  Soothing music and white noise may be helpful if there are background noises you cannot remove.  Stop tedious detailed work at least one hour before bedtime. HOME CARE INSTRUCTIONS   Keep a diary. Inform your caregiver about your progress. This includes any medication side effects. See your caregiver regularly. Take note of:  Times when you are asleep.  Times when you are awake during the night.  The quality of your sleep.  How you feel the next day. This information will help your caregiver care for you.  Get out of bed if you are still awake after 15 minutes. Read or do some quiet activity. Keep the lights down. Wait until you feel sleepy and go back to bed.  Keep regular sleeping and waking hours. Avoid naps.  Exercise regularly.  Avoid distractions at bedtime. Distractions include watching television or engaging in any intense or  detailed activity like attempting to balance the household checkbook.  Develop a bedtime ritual. Keep a familiar routine of bathing, brushing your teeth, climbing into bed at the same time each night, listening to soothing music. Routines increase the success of falling to sleep faster.  Use relaxation techniques. This can be using breathing and muscle tension release routines. It can also include visualizing peaceful scenes. You can also help control troubling or intruding thoughts by keeping your mind occupied with boring or repetitive thoughts like the old concept of counting sheep. You can make it more creative like imagining planting one beautiful flower after another in your backyard garden.  During your day, work to eliminate stress. When this is not possible use some of the previous suggestions to help reduce the anxiety that accompanies stressful situations. MAKE SURE YOU:   Understand these instructions.  Will watch your condition.  Will get help right away if you are not doing well or get worse. Document Released: 05/24/2000 Document Revised: 08/19/2011 Document Reviewed: 06/24/2007 [facility] Patient Information 2013 [place], [place].

## 2012-09-29 ENCOUNTER — Telehealth: Payer: Self-pay

## 2012-09-29 NOTE — Telephone Encounter (Signed)
Patient left a message stating she was switched from Effexor to Pristic yesterday at her appt. Pt states she went to pick this medication up and it was $209. Pt would like to know what else she could try because there is no way she can afford this medication? Please advise?

## 2012-09-29 NOTE — Telephone Encounter (Signed)
She had said that thr pristiq is on her formulary. So we have limited options if we need a generic. We could continue the Effexor and add Seroquel  At night depending on how much that would cost her on her formulary

## 2012-09-30 MED ORDER — VENLAFAXINE HCL ER 225 MG PO TB24: 1.0000 | ORAL_TABLET | Freq: Every morning | ORAL | Status: DC

## 2012-09-30 NOTE — Progress Notes (Signed)
Patient ID: Kathryn Randall, female   DOB: 1991/03/15, 22 y.o.   MRN: [id number] BECCA BAYNE [id number] [birth year] 09/30/2012      Progress Note-Follow Up  Subjective  Chief Complaint  Chief Complaint  Patient presents with  . Follow-up    2 months    HPI  Patient is a 22 year old Caucasian female who is in today for followup. She is in today noting that she continues to struggle with a quick temper and increased irritability. She notes she's been struggling with anhedonia and sadness as well. Continues to have trouble with insomnia. She feels Effexor has only been marginally helpful. No other recent illness. No fevers or chills. No chest pain, palpitations, shortness of breath, GI or GU complaints. Continues to have back and hip pain frequently.  Past Medical History  Diagnosis Date  . Anxiety   . Depression   . Back pain   . Migraine 06/28/2011  . Allergic state 06/28/2011  . Preventative health care 06/28/2011  . Anxiety and depression 07/17/2011  . UTI (lower urinary tract infection) 08/18/2011  . Tachycardia 11/14/2011  . Anemia 11/14/2011  . Hypokalemia 11/14/2011  . Overweight 04/27/2012  . Dysmenorrhea 05/26/2012  . Neck strain 06/28/2012    Past Surgical History  Procedure Laterality Date  . Wisdom tooth extraction  22 yrs old    Family History  Problem Relation Age of Onset  . Migraines Mother   . Migraines Father   . Obesity Maternal Grandmother   . Heart disease Maternal Grandmother     CHF  . Hypertension Maternal Grandmother   . Diabetes Maternal Grandfather     type 2  . Obesity Maternal Grandfather   . Leukemia Paternal Grandfather   . Migraines Paternal Grandfather     History   Social History  . Marital Status: Single    Spouse Name: N/A    Number of Children: N/A  . Years of Education: N/A   Occupational History  . Not on file.   Social History Main Topics  . Smoking status: Never Smoker   . Smokeless tobacco: Never Used     Comment:  black and milds every once in awhile  . Alcohol Use: No  . Drug Use: No  . Sexually Active: Not Currently -- Female partner(s)   Other Topics Concern  . Not on file   Social History Narrative  . No narrative on file    Current Outpatient Prescriptions on File Prior to Visit  Medication Sig Dispense Refill  . Drospirenone-Ethinyl Estradiol-Levomefol (BEYAZ) 3-0.02-0.451 MG tablet Take 1 tablet by mouth daily.  3 Package  3  . HYDROcodone-acetaminophen (NORCO) 7.5-325 MG per tablet Take 1 tablet by mouth every 8 (eight) hours as needed for pain.  60 tablet  1  . meloxicam (MOBIC) 15 MG tablet Take 1 tablet (15 mg total) by mouth daily as needed for pain. With food  30 tablet  3  . metoprolol (LOPRESSOR) 50 MG tablet Take 1 tablet (50 mg total) by mouth 2 (two) times daily.  60 tablet  3  . promethazine (PHENERGAN) 25 MG tablet Take 1 tablet (25 mg total) by mouth every 8 (eight) hours as needed.  40 tablet  2  . rizatriptan (MAXALT) 10 MG tablet Take 1 tablet (10 mg total) by mouth as needed for migraine. May repeat in 2 hours if needed  9 tablet  1  . ciprofloxacin (CIPRO) 500 MG tablet Take 1 tablet (500 mg total) by mouth 2 (  two) times daily. X 3 days  6 tablet  0  . neomycin-polymyxin-hydrocortisone (CORTISPORIN) otic solution Place 3 drops into the left ear 3 (three) times daily. X 10 days  10 mL  0   No current facility-administered medications on file prior to visit.    Allergies  Allergen Reactions  . Ibuprofen     Racing heart  . Morphine And Related Itching    Review of Systems  Review of Systems  Constitutional: Negative for fever and malaise/fatigue.  HENT: Negative for congestion.   Eyes: Negative for discharge.  Respiratory: Negative for shortness of breath.   Cardiovascular: Negative for chest pain, palpitations and leg swelling.  Gastrointestinal: Negative for nausea, abdominal pain and diarrhea.  Genitourinary: Negative for dysuria.  Musculoskeletal: Positive  for back pain. Negative for falls.  Skin: Negative for rash.  Neurological: Negative for loss of consciousness and headaches.  Endo/Heme/Allergies: Negative for polydipsia.  Psychiatric/Behavioral: Positive for depression. Negative for suicidal ideas. The patient is nervous/anxious and has insomnia.     Objective  BP 110/70  Pulse 80  Temp(Src) 98.4 F (36.9 C) (Oral)  Ht 5\' 4"  (1.626 m)  Wt 193 lb 8 oz (87.771 kg)  BMI 33.2 kg/m2  SpO2 99%  LMP 09/21/2012  Physical Exam  Physical Exam  Constitutional: She is oriented to person, place, and time and well-developed, well-nourished, and in no distress. No distress.  HENT:  Head: Normocephalic and atraumatic.  Right Ear: External ear normal.  Left Ear: External ear normal.  Nose: Nose normal.  Mouth/Throat: Oropharynx is clear and moist. No oropharyngeal exudate.  Eyes: Conjunctivae are normal. Pupils are equal, round, and reactive to light. Right eye exhibits no discharge. Left eye exhibits no discharge. No scleral icterus.  Neck: Normal range of motion. Neck supple. No thyromegaly present.  Cardiovascular: Normal rate, regular rhythm, normal heart sounds and intact distal pulses.   No murmur heard. Pulmonary/Chest: Effort normal and breath sounds normal. No respiratory distress. She has no wheezes. She has no rales.  Abdominal: Soft. Bowel sounds are normal. She exhibits no distension and no mass. There is no tenderness.  Musculoskeletal: Normal range of motion. She exhibits no edema and no tenderness.  Lymphadenopathy:    She has no cervical adenopathy.  Neurological: She is alert and oriented to person, place, and time. She has normal reflexes. No cranial nerve deficit. Coordination normal.  Skin: Skin is warm and dry. No rash noted. She is not diaphoretic.  Psychiatric: Mood, memory and affect normal.    Lab Results  Component Value Date   TSH 1.21 04/27/2012   Lab Results  Component Value Date   WBC 7.1 05/26/2012    HGB 11.9* 05/26/2012   HCT 36.1 05/26/2012   MCV 87.3 05/26/2012   PLT 379.0 05/26/2012   Lab Results  Component Value Date   CREATININE 0.7 05/26/2012   BUN 12 05/26/2012   NA 140 05/26/2012   K 3.8 05/26/2012   CL 105 05/26/2012   CO2 24 05/26/2012   Lab Results  Component Value Date   ALT 11 04/27/2012   AST 21 04/27/2012   ALKPHOS 69 04/27/2012   BILITOT 0.2* 04/27/2012   Lab Results  Component Value Date   CHOL 163 07/17/2011   Lab Results  Component Value Date   HDL 81.90 07/17/2011   Lab Results  Component Value Date   LDLCALC 53 07/17/2011   Lab Results  Component Value Date   TRIG 140.0 07/17/2011   Lab  Results  Component Value Date   CHOLHDL 2 07/17/2011     Assessment & Plan  Anxiety and depression Will continue Effexor and add Seroquel titrating up to 300 mg for her increasing irritability, sadness and quick tememper.   Tachycardia Well controlled on current meds.   Back pain Given refill on Robaxin today

## 2012-09-30 NOTE — Telephone Encounter (Signed)
Left a detailed message on patients vm. Asked pt to call us back after talking to insurance to let us know what she would like [place] to do

## 2012-09-30 NOTE — Assessment & Plan Note (Signed)
Given refill on Robaxin today

## 2012-09-30 NOTE — Assessment & Plan Note (Signed)
Well-controlled on current meds 

## 2012-09-30 NOTE — Telephone Encounter (Signed)
Pt left a message stating that she would like to go back to the Effexor and add the Seroquel. Please advise?

## 2012-09-30 NOTE — Telephone Encounter (Signed)
OK so Effexor is the 225 mg po daily in am. Then send in Seroquel 100 mg tabs, 1/2 tab po qhs x 1 d, then 1 tabs po qhs x 1 day then 2 tabs po qhs x 1 day then 3 tabs po qhs, then come in in 2 weeks for further eval or call if any trouble

## 2012-09-30 NOTE — Telephone Encounter (Signed)
Disp: #90, Seroquel

## 2012-09-30 NOTE — Assessment & Plan Note (Signed)
Will continue Effexor and add Seroquel titrating up to 300 mg for her increasing irritability, sadness and quick tememper.

## 2012-10-21 ENCOUNTER — Telehealth: Payer: Self-pay

## 2012-10-21 NOTE — Telephone Encounter (Signed)
Pt left a message stating that new medication is working good.

## 2012-10-26 ENCOUNTER — Ambulatory Visit (INDEPENDENT_AMBULATORY_CARE_PROVIDER_SITE_OTHER): Payer: BC Managed Care – PPO | Admitting: Family Medicine

## 2012-10-26 ENCOUNTER — Encounter: Payer: Self-pay | Admitting: Family Medicine

## 2012-10-26 VITALS — BP 104/76 | HR 90 | Temp 98.0°F | Ht 64.0 in | Wt 192.1 lb

## 2012-10-26 DIAGNOSIS — M549 Dorsalgia, unspecified: Secondary | ICD-10-CM

## 2012-10-26 DIAGNOSIS — F341 Dysthymic disorder: Secondary | ICD-10-CM

## 2012-10-26 DIAGNOSIS — R350 Frequency of micturition: Secondary | ICD-10-CM

## 2012-10-26 DIAGNOSIS — R000 Tachycardia, unspecified: Secondary | ICD-10-CM

## 2012-10-26 DIAGNOSIS — N390 Urinary tract infection, site not specified: Secondary | ICD-10-CM

## 2012-10-26 DIAGNOSIS — F419 Anxiety disorder, unspecified: Secondary | ICD-10-CM

## 2012-10-26 DIAGNOSIS — R110 Nausea: Secondary | ICD-10-CM

## 2012-10-26 MED ORDER — HYDROCODONE-ACETAMINOPHEN 7.5-325 MG PO TABS: 1.0000 | ORAL_TABLET | Freq: Three times a day (TID) | ORAL | Status: DC | PRN

## 2012-10-26 MED ORDER — VENLAFAXINE HCL ER 150 MG PO CP24: 150.0000 mg | ORAL_CAPSULE | Freq: Every day | ORAL | Status: DC

## 2012-10-26 MED ORDER — QUETIAPINE FUMARATE 300 MG PO TABS: 300.0000 mg | ORAL_TABLET | Freq: Every day | ORAL | Status: DC

## 2012-10-26 MED ORDER — PHENAZOPYRIDINE HCL 200 MG PO TABS: 200.0000 mg | ORAL_TABLET | Freq: Three times a day (TID) | ORAL | Status: DC | PRN

## 2012-10-26 MED ORDER — CIPROFLOXACIN HCL 500 MG PO TABS: 500.0000 mg | ORAL_TABLET | Freq: Two times a day (BID) | ORAL | Status: DC

## 2012-10-26 MED ORDER — PROMETHAZINE HCL 25 MG PO TABS: 25.0000 mg | ORAL_TABLET | Freq: Three times a day (TID) | ORAL | Status: DC | PRN

## 2012-10-26 NOTE — Patient Instructions (Addendum)
Probiotic daily, Digestive Advantage, Align or a generic Cranberry juice  Urinary Tract Infection Urinary tract infections (UTIs) can develop anywhere along your urinary tract. Your urinary tract is your body's drainage system for removing wastes and extra water. Your urinary tract includes two kidneys, two ureters, a bladder, and a urethra. Your kidneys are a pair of bean-shaped organs. Each kidney is about the size of your fist. They are located below your ribs, one on each side of your spine. CAUSES Infections are caused by microbes, which are microscopic organisms, including fungi, viruses, and bacteria. These organisms are so small that they can only be seen through a microscope. Bacteria are the microbes that most commonly cause UTIs. SYMPTOMS  Symptoms of UTIs may vary by age and gender of the patient and by the location of the infection. Symptoms in young women typically include a frequent and intense urge to urinate and a painful, burning feeling in the bladder or urethra during urination. Older women and men are more likely to be tired, shaky, and weak and have muscle aches and abdominal pain. A fever may mean the infection is in your kidneys. Other symptoms of a kidney infection include pain in your back or sides below the ribs, nausea, and vomiting. DIAGNOSIS To diagnose a UTI, your caregiver will ask you about your symptoms. Your caregiver also will ask to provide a urine sample. The urine sample will be tested for bacteria and white blood cells. White blood cells are made by your body to help fight infection. TREATMENT  Typically, UTIs can be treated with medication. Because most UTIs are caused by a bacterial infection, they usually can be treated with the use of antibiotics. The choice of antibiotic and length of treatment depend on your symptoms and the type of bacteria causing your infection. HOME CARE INSTRUCTIONS  If you were prescribed antibiotics, take them exactly as your  caregiver instructs you. Finish the medication even if you feel better after you have only taken some of the medication.  Drink enough water and fluids to keep your urine clear or pale yellow.  Avoid caffeine, tea, and carbonated beverages. They tend to irritate your bladder.  Empty your bladder often. Avoid holding urine for long periods of time.  Empty your bladder before and after sexual intercourse.  After a bowel movement, women should cleanse from front to back. Use each tissue only once. SEEK MEDICAL CARE IF:   You have back pain.  You develop a fever.  Your symptoms do not begin to resolve within 3 days. SEEK IMMEDIATE MEDICAL CARE IF:   You have severe back pain or lower abdominal pain.  You develop chills.  You have nausea or vomiting.  You have continued burning or discomfort with urination. MAKE SURE YOU:   Understand these instructions.  Will watch your condition.  Will get help right away if you are not doing well or get worse. Document Released: 03/06/2005 Document Revised: 11/26/2011 Document Reviewed: 07/05/2011 [facility] Patient Information 2013 [place], [place].

## 2012-10-26 NOTE — Progress Notes (Signed)
Patient ID: Kathryn Randall, female   DOB: 26-Sep-1990, 22 y.o.   MRN: [id number] TECIA Randall [id number] January 22, 1991 10/26/2012      Progress Note-Follow Up  Subjective  Chief Complaint  Chief Complaint  Patient presents with  . Follow-up    on medications  . Urinary Tract Infection    blood in urine, frequency, low back pain X 5 days    HPI   patient is a 22 year old Caucasian female in today for followup. She has noted some urinary symptoms over the last 5 days. To some increased low back and lower abdominal pain. Some urinary frequency and urgency as well as hematuria and dysuria. Has been struggling with malaise and myalgias. Has some flank discomfort and suprapubic pressure as well. Note some low-grade fevers and chills. Denies chest pain palpitations, shortness of breath, GI complaints.  Past Medical History  Diagnosis Date  . Anxiety   . Depression   . Back pain   . Migraine 06/28/2011  . Allergic state 06/28/2011  . Preventative health care 06/28/2011  . Anxiety and depression 07/17/2011  . UTI (lower urinary tract infection) 08/18/2011  . Tachycardia 11/14/2011  . Anemia 11/14/2011  . Hypokalemia 11/14/2011  . Overweight 04/27/2012  . Dysmenorrhea 05/26/2012  . Neck strain 06/28/2012    Past Surgical History  Procedure Laterality Date  . Wisdom tooth extraction  22 yrs old    Family History  Problem Relation Age of Onset  . Migraines Mother   . Migraines Father   . Obesity Maternal Grandmother   . Heart disease Maternal Grandmother     CHF  . Hypertension Maternal Grandmother   . Diabetes Maternal Grandfather     type 2  . Obesity Maternal Grandfather   . Leukemia Paternal Grandfather   . Migraines Paternal Grandfather     History   Social History  . Marital Status: Single    Spouse Name: N/A    Number of Children: N/A  . Years of Education: N/A   Occupational History  . Not on file.   Social History Main Topics  . Smoking status: Never Smoker    . Smokeless tobacco: Never Used     Comment: black and milds every once in awhile  . Alcohol Use: No  . Drug Use: No  . Sexually Active: Not Currently -- Female partner(s)   Other Topics Concern  . Not on file   Social History Narrative  . No narrative on file    Current Outpatient Prescriptions on File Prior to Visit  Medication Sig Dispense Refill  . ALPRAZolam (XANAX) 1 MG tablet Take 1 tablet (1 mg total) by mouth 2 (two) times daily as needed for anxiety.  40 tablet  1  . Drospirenone-Ethinyl Estradiol-Levomefol (BEYAZ) 3-0.02-0.451 MG tablet Take 1 tablet by mouth daily.  3 Package  3  . meloxicam (MOBIC) 15 MG tablet Take 1 tablet (15 mg total) by mouth daily as needed for pain. With food  30 tablet  3  . methocarbamol (ROBAXIN) 500 MG tablet Take 1 tablet (500 mg total) by mouth 3 (three) times daily as needed.  90 tablet  1  . metoprolol (LOPRESSOR) 50 MG tablet Take 1 tablet (50 mg total) by mouth 2 (two) times daily.  60 tablet  3  . neomycin-polymyxin-hydrocortisone (CORTISPORIN) otic solution Place 3 drops into the left ear 3 (three) times daily. X 10 days  10 mL  0  . rizatriptan (MAXALT) 10 MG tablet Take 1 tablet (  10 mg total) by mouth as needed for migraine. May repeat in 2 hours if needed  9 tablet  1   No current facility-administered medications on file prior to visit.    Allergies  Allergen Reactions  . Ibuprofen     Racing heart  . Morphine And Related Itching    Review of Systems  Review of Systems  Constitutional: Negative for fever and malaise/fatigue.  HENT: Negative for congestion.   Eyes: Negative for discharge.  Respiratory: Negative for shortness of breath.   Cardiovascular: Negative for chest pain, palpitations and leg swelling.  Gastrointestinal: Negative for nausea, abdominal pain and diarrhea.  Genitourinary: Positive for dysuria, urgency, frequency, hematuria and flank pain.  Musculoskeletal: Negative for falls.  Skin: Negative for rash.   Neurological: Negative for loss of consciousness and headaches.  Endo/Heme/Allergies: Negative for polydipsia.  Psychiatric/Behavioral: Negative for depression and suicidal ideas. The patient is not nervous/anxious and does not have insomnia.     Objective  BP 104/76  Pulse 107  Temp(Src) 98 F (36.7 C) (Oral)  Ht 5\' 4"  (1.626 m)  Wt 192 lb 1.9 oz (87.145 kg)  BMI 32.96 kg/m2  SpO2 97%  LMP 09/14/2012  Physical Exam  Physical Exam  Constitutional: She is oriented to person, place, and time and well-developed, well-nourished, and in no distress. No distress.  HENT:  Head: Normocephalic and atraumatic.  Eyes: Conjunctivae are normal.  Neck: Neck supple. No thyromegaly present.  Cardiovascular: Normal rate, regular rhythm and normal heart sounds.   No murmur heard. Pulmonary/Chest: Effort normal and breath sounds normal. She has no wheezes.  Abdominal: She exhibits no distension and no mass.  Musculoskeletal: She exhibits no edema.  Lymphadenopathy:    She has no cervical adenopathy.  Neurological: She is alert and oriented to person, place, and time.  Skin: Skin is warm and dry. No rash noted. She is not diaphoretic.  Psychiatric: Memory, affect and judgment normal.    Lab Results  Component Value Date   TSH 1.21 04/27/2012   Lab Results  Component Value Date   WBC 7.1 05/26/2012   HGB 11.9* 05/26/2012   HCT 36.1 05/26/2012   MCV 87.3 05/26/2012   PLT 379.0 05/26/2012   Lab Results  Component Value Date   CREATININE 0.7 05/26/2012   BUN 12 05/26/2012   NA 140 05/26/2012   K 3.8 05/26/2012   CL 105 05/26/2012   CO2 24 05/26/2012   Lab Results  Component Value Date   ALT 11 04/27/2012   AST 21 04/27/2012   ALKPHOS 69 04/27/2012   BILITOT 0.2* 04/27/2012   Lab Results  Component Value Date   CHOL 163 07/17/2011   Lab Results  Component Value Date   HDL 81.90 07/17/2011   Lab Results  Component Value Date   LDLCALC 53 07/17/2011   Lab Results   Component Value Date   TRIG 140.0 07/17/2011   Lab Results  Component Value Date   CHOLHDL 2 07/17/2011     Assessment & Plan  UTI (lower urinary tract infection) Started on Cipro but culture shows resistance, switched to Bactrim DS, start a probiotic  Anxiety and depression patient reports good response to Seroquel, feels much calmer. No concerning side effects. No changes today  Tachycardia Well controlled on recheck, no changes.  Back pain Improved with current meds, encouraged activity as tolerated

## 2012-10-27 LAB — URINALYSIS
Nitrite: POSITIVE — AB
Specific Gravity, Urine: 1.024 (ref 1.005–1.030)
Urobilinogen, UA: 0.2 mg/dL (ref 0.0–1.0)

## 2012-10-27 LAB — CBC
HCT: 35.9 % — ABNORMAL LOW (ref 36.0–46.0)
Platelets: 366 10*3/uL (ref 150–400)
RDW: 15.7 % — ABNORMAL HIGH (ref 11.5–15.5)
WBC: 6 10*3/uL (ref 4.0–10.5)

## 2012-10-28 LAB — URINE CULTURE: Colony Count: >=100000

## 2012-10-29 MED ORDER — SULFAMETHOXAZOLE-TMP DS 800-160 MG PO TABS: ORAL_TABLET | ORAL | Status: DC

## 2012-10-30 NOTE — Assessment & Plan Note (Signed)
Well controlled on recheck, no changes.

## 2012-10-30 NOTE — Assessment & Plan Note (Signed)
Improved with current meds, encouraged activity as tolerated

## 2012-10-30 NOTE — Assessment & Plan Note (Addendum)
Started on Cipro but culture shows resistance, switched to Bactrim DS, start a probiotic

## 2012-10-30 NOTE — Assessment & Plan Note (Signed)
patient reports good response to Seroquel, feels much calmer. No concerning side effects. No changes today

## 2012-11-12 ENCOUNTER — Other Ambulatory Visit: Payer: Self-pay

## 2012-11-12 DIAGNOSIS — M549 Dorsalgia, unspecified: Secondary | ICD-10-CM

## 2012-11-12 NOTE — Telephone Encounter (Signed)
Last RX was wrote on 10-26-12 quantity 60 with 1 refill

## 2012-11-19 ENCOUNTER — Telehealth: Payer: Self-pay

## 2012-11-19 NOTE — Telephone Encounter (Signed)
Pt left a message stating that her ears have been hurting since Monday along with some jaw pain? Pt would like to know if an antibiotic could be called in or does pt need appt?  Please advise?

## 2012-11-19 NOTE — Telephone Encounter (Signed)
Ear and jaw pain is not always infection. If she does not have a fever or discharge from ear am willing to call her in some pain drops called Auralgan drops 3 drops qid prn pain, disp #1 bottle, if worsens come in so I can evaluate

## 2012-11-20 MED ORDER — ANTIPYRINE-BENZOCAINE 5.4-1.4 % OT SOLN: 3.0000 [drp] | Freq: Four times a day (QID) | OTIC | Status: DC | PRN

## 2012-11-24 ENCOUNTER — Other Ambulatory Visit: Payer: Self-pay | Admitting: Family Medicine

## 2012-11-30 ENCOUNTER — Other Ambulatory Visit: Payer: Self-pay | Admitting: Family Medicine

## 2012-11-30 ENCOUNTER — Telehealth: Payer: Self-pay | Admitting: Family Medicine

## 2012-11-30 DIAGNOSIS — M549 Dorsalgia, unspecified: Secondary | ICD-10-CM

## 2012-11-30 NOTE — Telephone Encounter (Signed)
refill-hydrocodone/acetaminophen 7.5-325 tablets. Take one tablet by mouth every 8 hours as needed for pain. Qty 60 last fill 6.5.14

## 2012-11-30 NOTE — Telephone Encounter (Signed)
Please advise RX refill? Last RX was wrote on 09-28-12 quantity 40 with 1 refill?  If ok fax to [phone]

## 2012-12-01 ENCOUNTER — Telehealth: Payer: Self-pay

## 2012-12-01 NOTE — Telephone Encounter (Signed)
Pt left a message stating that during her move to [place] she lost her Hydrocodone? Pt stated on message that she is aware that medication shouldn't be filled for a couple more weeks? Please advise?

## 2012-12-01 NOTE — Telephone Encounter (Signed)
Sorry but she has to wait til due, cannot fill this prescription early

## 2012-12-02 NOTE — Telephone Encounter (Signed)
Patient informed and voiced understanding

## 2012-12-10 ENCOUNTER — Telehealth: Payer: Self-pay | Admitting: *Deleted

## 2012-12-10 NOTE — Telephone Encounter (Signed)
Pt called requesting to have Rx for Hydrocodone-APAP sent to pharmacy as it will be due for refill this weekend & she is having her "period with lots of pain". Patient has [8] different pharmacies on file in EMR; [facility] with contact name and number for return call RE: refill request & that we would need name of pharmacy to (1) verify last refill date, so as to have fill date & (2) to know what pharmacy to send Rx to/SLS
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
## 2012-12-17 ENCOUNTER — Telehealth: Payer: Self-pay | Admitting: *Deleted

## 2012-12-17 NOTE — Telephone Encounter (Signed)
Pt called c/o no relief from back pain; has tried stretching, walking, icing & pain medications are not helping. Informed pt that she needs an appointment for A&E of back pain d/t not getting relief from any at home method; pt agreed to appt for Fri, 07.11.13 @ 1:30p/SLS

## 2012-12-18 ENCOUNTER — Ambulatory Visit (INDEPENDENT_AMBULATORY_CARE_PROVIDER_SITE_OTHER): Payer: BC Managed Care – PPO | Admitting: Nurse Practitioner

## 2012-12-18 ENCOUNTER — Encounter: Payer: Self-pay | Admitting: Nurse Practitioner

## 2012-12-18 VITALS — BP 105/68 | HR 90 | Temp 99.0°F | Resp 16 | Wt 190.0 lb

## 2012-12-18 DIAGNOSIS — M533 Sacrococcygeal disorders, not elsewhere classified: Secondary | ICD-10-CM

## 2012-12-18 DIAGNOSIS — N946 Dysmenorrhea, unspecified: Secondary | ICD-10-CM

## 2012-12-18 DIAGNOSIS — R0602 Shortness of breath: Secondary | ICD-10-CM

## 2012-12-18 DIAGNOSIS — R609 Edema, unspecified: Secondary | ICD-10-CM

## 2012-12-18 DIAGNOSIS — R600 Localized edema: Secondary | ICD-10-CM

## 2012-12-18 MED ORDER — DROSPIREN-ETH ESTRAD-LEVOMEFOL 3-0.02-0.451 MG PO TABS: ORAL_TABLET | ORAL | Status: DC

## 2012-12-18 MED ORDER — PREDNISONE 10 MG PO TABS: ORAL_TABLET | ORAL | Status: DC

## 2012-12-18 MED ORDER — METHYLPREDNISOLONE ACETATE 40 MG/ML IJ SUSP
40.0000 mg | Freq: Once | INTRAMUSCULAR | Status: AC
  Administered 2012-12-18: 40 mg via INTRAMUSCULAR

## 2012-12-18 NOTE — Patient Instructions (Signed)
Hopefully the steroid will relieve inflammation & give you some relief from pain. We will extend [facility] so you have 4 periods/year, since back pain is worse when having periods. I am concerned about the puffiness of your hands and have ordered some preliminary labs. Sometimes this is a symptom of connective tissue disease. We will call with results. You may need a rheumatology work-up. We will request records from Dr. Yevette Edwards. Feel better! Pleasure to meet you.

## 2012-12-19 ENCOUNTER — Encounter: Payer: Self-pay | Admitting: Nurse Practitioner

## 2012-12-19 LAB — CK: Total CK: 40 U/L (ref 7–177)

## 2012-12-19 NOTE — Progress Notes (Signed)
Subjective:    Kathryn Randall is a 22 y.o. female who presents for follow up of low back problems. Current symptoms include: pain in "tail bone" (throbbing in character; 7/10 in severity). Symptoms have not changed from the previous visit. Exacerbating factors identified by the patient are sitting and menstrual cycles. [name] has been seen multiple times by PCP over the past year for ongoing back pain. She has used NSAIDS, muscle relaxers, & hydrocodone with minimal relief. She is having manses today and states the pain is constant and moderate to severe. Stretches and exercises do not help.She has seen chiropractor in past and states the decompresion treatments helped, but pain always returns and she can afford to go to chiropractor several times weekly. She saw Dr. Yevette Edwards 11/13 who ordered & reviewed xrays of spine and suggested MRI of lumbar spine & sacroilliac joints. Pt states she had MRI completed, but has not been able to follow up as her insurance did not cover all expenses and she still owes balance. I have reviewed the one available office note from 1/13, as well as pelvic ultrasounds and xrays.  The following portions of the patient's history were reviewed and updated as appropriate: allergies, current medications, past family history, past medical history, past surgical history and problem list.    ROS: Const: reports weight gain & not having felt well for last 3 years. ENT: no sores in mouth or nose, no vision change Cardiac: tachycardia, treated by Dr. Lollie Sails metoprolol-says she has same arrythmia as her mother Lungs: SOB w/little exertion "can't play fight without getting winded"  GI: nausea with car rides & MC.No diarrhea. GU: dysmenorrhea and menorrhagia-taking OBC to control bleeding. Had pelvic xray 1/13-L small pelvic phlebolith, 2 pelvic US '07 & '11 normal structures [place]: ongoing back pain for at least 3 years in "tail bone" worse with MC, constant pain that "tires  her nervendos" Skin: cannot tolerate being in sun as it causes pruritus, no rashes, thinks hair has thinned Endo: "hot all the time" Neuro: Migraines, takes maxalt, denies weakness, seizures  Objective:    BP 105/68  Pulse 90  Temp(Src) 99 F (37.2 C) (Oral)  Resp 16  Wt 190 lb (86.183 kg)  BMI 32.6 kg/m2  SpO2 99%  LMP 12/18/2012  General Appearance:    Alert, cooperative, no distress, appears stated age. Accompanied by friend, Fayrene Fearing  Head:    Normocephalic, without obvious abnormality, atraumatic  Eyes:    PERRL, conjunctiva/corneas clear, EOM's intact, both eyes  Ears:    Normal TM's and external ear canals, both ears  Nose:   Nares normal, septum midline, mucosa normal, no drainage    or sinus tenderness  Throat:   Lips, mucosa, and tongue normal; teeth and gums normal  Neck:   Supple, symmetrical, trachea midline, no adenopathy;    thyroid:  no enlargement/tenderness/nodules;  Back:     Symmetric, no curvature, ROM normal, no CVA tenderness. Tender at sacroilliac joints & sacrum  Lungs:     Clear to auscultation bilaterally, respirations unlabored      Heart:    Regular rate and rhythm, S1 and S2 normal, no murmur, rub   or gallop     Abdomen:     Soft, non-tender, bowel sounds active all four quadrants,    no masses, no organomegaly        Extremities:   Extremities atraumatic, no cyanosis, Hands and forearms are puffy,  Fingers & toes are slightly cool. No pallor.  Pulses:   2+ and symmetric all extremities  Skin:   Skin color normal. No rashes or lesions. Skin over fingers appears shiny & tight from nails to PIP joints, as in scleroderma and other connective tissue diseases. NAils have multiple raised vertical ridges. No pitting.   Lymph nodes:   Cervical, supraclavicular, and axillary nodes normal  Neurologic:   CNII-XII intact, normal strength, sensation and reflexes    throughout      Assessment:   1-sacral back pain-chronic. Worsened by menses. Unrelieved by  NSAIDS, muscle relaxers, hyrocodone. 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 Request MRI results from Dr. Yevette Edwards  2-DD: connective tissue disease-scleroderma, MCTD, CREST, dermatomyositis, polymyositis. ANA, CK, ACE today 3-extend active tabs to 90 days, then inactive tab for 7, to decrease frequency of cycles to 4/yr to decrease intense episodes of back pain. Rec repeat PAP. 4-consider cxr if continues to complain, may be r/t tachycardia Consider autoimmune work up, as all symptoms may be related to a connective tissue disease syndrome

## 2012-12-22 ENCOUNTER — Telehealth: Payer: Self-pay | Admitting: Nurse Practitioner

## 2012-12-22 NOTE — Telephone Encounter (Signed)
Patient returned call and spoke to Layne. 

## 2012-12-22 NOTE — Telephone Encounter (Signed)
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 I did not screen for vasculitis although she exhibits some symptoms of AI vasculitides-raynaud's & frequent headaches. Will recommend referral to rheumatology.

## 2012-12-23 ENCOUNTER — Telehealth: Payer: Self-pay | Admitting: Nurse Practitioner

## 2012-12-23 NOTE — Telephone Encounter (Signed)
Faxed all info to Dr. Jacquenette Shone.

## 2012-12-23 NOTE — Telephone Encounter (Signed)
Faxing referral to Dr Jacquenette Shone with [facility] in [place]. LM on Myrna's voice mail with info. Dr. Salome Holmes office will review referral request.

## 2012-12-25 ENCOUNTER — Ambulatory Visit (INDEPENDENT_AMBULATORY_CARE_PROVIDER_SITE_OTHER): Payer: BC Managed Care – PPO | Admitting: Family Medicine

## 2012-12-25 ENCOUNTER — Encounter: Payer: Self-pay | Admitting: Family Medicine

## 2012-12-25 VITALS — BP 124/82 | HR 109 | Temp 98.3°F | Ht 64.0 in | Wt 193.0 lb

## 2012-12-25 DIAGNOSIS — F329 Major depressive disorder, single episode, unspecified: Secondary | ICD-10-CM

## 2012-12-25 DIAGNOSIS — N946 Dysmenorrhea, unspecified: Secondary | ICD-10-CM

## 2012-12-25 DIAGNOSIS — F341 Dysthymic disorder: Secondary | ICD-10-CM

## 2012-12-25 DIAGNOSIS — D649 Anemia, unspecified: Secondary | ICD-10-CM

## 2012-12-25 DIAGNOSIS — M549 Dorsalgia, unspecified: Secondary | ICD-10-CM

## 2012-12-25 DIAGNOSIS — N390 Urinary tract infection, site not specified: Secondary | ICD-10-CM

## 2012-12-25 DIAGNOSIS — R000 Tachycardia, unspecified: Secondary | ICD-10-CM

## 2012-12-25 LAB — RENAL FUNCTION PANEL
Albumin: 4.1 g/dL (ref 3.5–5.2)
Calcium: 9.2 mg/dL (ref 8.4–10.5)
Creat: 0.98 mg/dL (ref 0.50–1.10)
Glucose, Bld: 65 mg/dL — ABNORMAL LOW (ref 70–99)
Sodium: 142 mEq/L (ref 135–145)

## 2012-12-25 LAB — CBC
MCV: 85.5 fL (ref 78.0–100.0)
Platelets: 392 10*3/uL (ref 150–400)
RBC: 4.29 MIL/uL (ref 3.87–5.11)
WBC: 10.7 10*3/uL — ABNORMAL HIGH (ref 4.0–10.5)

## 2012-12-25 LAB — HEPATIC FUNCTION PANEL
ALT: 8 U/L (ref 0–35)
Albumin: 4.1 g/dL (ref 3.5–5.2)
Bilirubin, Direct: 0.1 mg/dL (ref 0.0–0.3)

## 2012-12-25 MED ORDER — DROSPIREN-ETH ESTRAD-LEVOMEFOL 3-0.02-0.451 MG PO TABS: ORAL_TABLET | ORAL | Status: DC

## 2012-12-25 MED ORDER — METHOCARBAMOL 500 MG PO TABS: 500.0000 mg | ORAL_TABLET | Freq: Three times a day (TID) | ORAL | Status: DC | PRN

## 2012-12-25 MED ORDER — AMITRIPTYLINE HCL 10 MG PO TABS: 10.0000 mg | ORAL_TABLET | Freq: Every day | ORAL | Status: DC

## 2012-12-25 MED ORDER — VENLAFAXINE HCL ER 150 MG PO CP24: 150.0000 mg | ORAL_CAPSULE | Freq: Every day | ORAL | Status: DC

## 2012-12-25 MED ORDER — HYDROCODONE-ACETAMINOPHEN 7.5-325 MG PO TABS: 1.0000 | ORAL_TABLET | Freq: Three times a day (TID) | ORAL | Status: DC | PRN

## 2012-12-25 MED ORDER — METOPROLOL TARTRATE 50 MG PO TABS: 50.0000 mg | ORAL_TABLET | Freq: Two times a day (BID) | ORAL | Status: DC

## 2012-12-25 MED ORDER — ALPRAZOLAM 1 MG PO TABS: 1.0000 mg | ORAL_TABLET | Freq: Two times a day (BID) | ORAL | Status: DC | PRN

## 2012-12-25 MED ORDER — PROMETHAZINE HCL 25 MG PO TABS: 25.0000 mg | ORAL_TABLET | Freq: Three times a day (TID) | ORAL | Status: DC | PRN

## 2012-12-25 NOTE — Progress Notes (Signed)
Patient ID: Kathryn Randall, female   DOB: [birth year], 22 y.o.   MRN: [id number] Kathryn Randall [id number] 1990/07/23 12/25/2012      Progress Note-Follow Up  Subjective  Chief Complaint  Chief Complaint  Patient presents with  . Follow-up    2 month    HPI  This is a 22 year old Caucasian female in today for followup. She has not taken her metoprolol today and her pulse is up slightly. Denies sensation of chest pain or palpitations. No shortness of breath. Her backpain and hip pain are somewhat improved from last week. She is under a great deal of stress. Her boyfriend's mother with whom she is very close has metastatic cancer and is in hospice. She reports being frequently tearful and struggling with low mood.She denies any suicidal ideation. No recent fever or migraine  Past Medical History  Diagnosis Date  . Anxiety   . Depression   . Back pain   . Migraine 06/28/2011  . Allergic state 06/28/2011  . Preventative health care 06/28/2011  . Anxiety and depression 07/17/2011  . UTI (lower urinary tract infection) 08/18/2011  . Tachycardia 11/14/2011  . Anemia 11/14/2011  . Hypokalemia 11/14/2011  . Overweight(278.02) 04/27/2012  . Dysmenorrhea 05/26/2012  . Neck strain 06/28/2012    Past Surgical History  Procedure Laterality Date  . Wisdom tooth extraction  22 yrs old    Family History  Problem Relation Age of Onset  . Migraines Mother   . Arthritis Mother   . Hearing loss Mother   . Menstrual problems Mother   . Migraines Father   . Obesity Maternal Grandmother   . Heart disease Maternal Grandmother     CHF  . Hypertension Maternal Grandmother   . Hearing loss Maternal Grandmother   . Arthritis Maternal Grandmother   . Menstrual problems Maternal Grandmother   . Diabetes Maternal Grandfather     type 2  . Obesity Maternal Grandfather   . Leukemia Paternal Grandfather   . Migraines Paternal Grandfather     History   Social History  . Marital Status: Single    Spouse Name: N/A    Number of Children: N/A  . Years of Education: N/A   Occupational History  . Not on file.   Social History Main Topics  . Smoking status: Never Smoker   . Smokeless tobacco: Never Used     Comment: black and milds every once in awhile  . Alcohol Use: No  . Drug Use: 7.00 per week    Special: Hydrocodone  . Sexually Active: Not Currently -- Female partner(s)   Other Topics Concern  . Not on file   Social History Narrative  . No narrative on file    Current Outpatient Prescriptions on File Prior to Visit  Medication Sig Dispense Refill  . rizatriptan (MAXALT) 10 MG tablet Take 1 tablet (10 mg total) by mouth as needed for migraine. May repeat in 2 hours if needed  9 tablet  1   No current facility-administered medications on file prior to visit.    Allergies  Allergen Reactions  . Ibuprofen     Racing heart  . Morphine And Related Itching    Review of Systems  Review of Systems  Constitutional: Positive for malaise/fatigue. Negative for fever.  HENT: Negative for congestion.   Eyes: Negative for pain and discharge.  Respiratory: Negative for shortness of breath.   Cardiovascular: Negative for chest pain, palpitations and leg swelling.  Gastrointestinal: Negative for nausea, abdominal  pain and diarrhea.  Genitourinary: Negative for dysuria.  Musculoskeletal: Positive for back pain and joint pain. Negative for falls.  Skin: Negative for rash.  Neurological: Negative for loss of consciousness and headaches.  Endo/Heme/Allergies: Negative for polydipsia.  Psychiatric/Behavioral: Positive for depression. Negative for suicidal ideas. The patient is nervous/anxious. The patient does not have insomnia.     Objective  BP 124/82  Pulse 109  Temp(Src) 98.3 F (36.8 C) (Oral)  Ht 5\' 4"  (1.626 m)  Wt 193 lb (87.544 kg)  BMI 33.11 kg/m2  SpO2 98%  LMP 12/22/2012  Physical Exam  Physical Exam  Constitutional: She is oriented to person, place, and  time and well-developed, well-nourished, and in no distress. No distress.  HENT:  Head: Normocephalic and atraumatic.  Eyes: Conjunctivae are normal.  Neck: Neck supple. No thyromegaly present.  Cardiovascular: Normal rate, regular rhythm and normal heart sounds.  Exam reveals no gallop.   No murmur heard. Pulmonary/Chest: Effort normal and breath sounds normal. She has no wheezes.  Abdominal: She exhibits no distension and no mass.  Musculoskeletal: She exhibits no edema.  Lymphadenopathy:    She has no cervical adenopathy.  Neurological: She is alert and oriented to person, place, and time.  Skin: Skin is warm and dry. No rash noted. She is not diaphoretic.  Psychiatric: Memory, affect and judgment normal.    Lab Results  Component Value Date   TSH 1.21 04/27/2012   Lab Results  Component Value Date   WBC 6.0 10/26/2012   HGB 11.9* 10/26/2012   HCT 35.9* 10/26/2012   MCV 82.9 10/26/2012   PLT 366 10/26/2012   Lab Results  Component Value Date   CREATININE 0.7 05/26/2012   BUN 12 05/26/2012   NA 140 05/26/2012   K 3.8 05/26/2012   CL 105 05/26/2012   CO2 24 05/26/2012   Lab Results  Component Value Date   ALT 11 04/27/2012   AST 21 04/27/2012   ALKPHOS 69 04/27/2012   BILITOT 0.2* 04/27/2012   Lab Results  Component Value Date   CHOL 163 07/17/2011   Lab Results  Component Value Date   HDL 81.90 07/17/2011   Lab Results  Component Value Date   LDLCALC 53 07/17/2011   Lab Results  Component Value Date   TRIG 140.0 07/17/2011   Lab Results  Component Value Date   CHOLHDL 2 07/17/2011     Assessment & Plan  Back pain Improved from last week, try Salon Pas patches or cream for breakthrough pain, labs from last week have returned within normal limits. Add a Rheumatoid factor and basic labs today, encouraged ongoing exercise as tolerated. Will try adding low dose Eleavil qhs to help with chronic pain, sleep and increased depression/anxiety secondary to her  boyfriend's mother being on Hospice for metastatic cancer. They have become very close and she is struggling with her compounded grief since she has only recently lost her own grandmother.  Anxiety and depression Unable to increase her Effexor for monetary reasons her insurance covers the 150 but not 225. Due to increasing stressors will add Elavil 10 mg qhs and see again in 3 weeks.  Tachycardia Did not take her Metoprolol and her pulse is up today. Encouraged to take her dose prior to the next visit so we can further evaluate efficacy  Anemia Repeat cbc today  Dysmenorrhea Agree with attempts to stop menstrual flow for 3 months continuously to see if that helps her chronic issues with pain.  UTI (lower urinary tract infection) Due to recent increased pain will repeat UA

## 2012-12-25 NOTE — Assessment & Plan Note (Signed)
Due to recent increased pain will repeat UA

## 2012-12-25 NOTE — Assessment & Plan Note (Signed)
Repeat cbc today 

## 2012-12-25 NOTE — Assessment & Plan Note (Signed)
Did not take her Metoprolol and her pulse is up today. Encouraged to take her dose prior to the next visit so we can further evaluate efficacy

## 2012-12-25 NOTE — Assessment & Plan Note (Signed)
Improved from last week, try Salon Pas patches or cream for breakthrough pain, labs from last week have returned within normal limits. Add a Rheumatoid factor and basic labs today, encouraged ongoing exercise as tolerated. Will try adding low dose Eleavil qhs to help with chronic pain, sleep and increased depression/anxiety secondary to her boyfriend's mother being on Hospice for metastatic cancer. They have become very close and she is struggling with her compounded grief since she has only recently lost her own grandmother.

## 2012-12-25 NOTE — Patient Instructions (Signed)
Salon pas patches or cream as needed   Back Pain, Adult Low back pain is very common. About 1 in 5 people have back pain.The cause of low back pain is rarely dangerous. The pain often gets better over time.About half of people with a sudden onset of back pain feel better in just 2 weeks. About 8 in 10 people feel better by 6 weeks.  CAUSES Some common causes of back pain include:  Strain of the muscles or ligaments supporting the spine.  Wear and tear (degeneration) of the spinal discs.  Arthritis.  Direct injury to the back. DIAGNOSIS Most of the time, the direct cause of low back pain is not known.However, back pain can be treated effectively even when the exact cause of the pain is unknown.Answering your caregiver's questions about your overall health and symptoms is one of the most accurate ways to make sure the cause of your pain is not dangerous. If your caregiver needs more information, he or she may order lab work or imaging tests (X-rays or MRIs).However, even if imaging tests show changes in your back, this usually does not require surgery. HOME CARE INSTRUCTIONS For many people, back pain returns.Since low back pain is rarely dangerous, it is often a condition that people can learn to [facility] their own.   Remain active. It is stressful on the back to sit or stand in one place. Do not sit, drive, or stand in one place for more than 30 minutes at a time. Take short walks on level surfaces as soon as pain allows.Try to increase the length of time you walk each day.  Do not stay in bed.Resting more than 1 or 2 days can delay your recovery.  Do not avoid exercise or work.Your body is made to move.It is not dangerous to be active, even though your back may hurt.Your back will likely heal faster if you return to being active before your pain is gone.  Pay attention to your body when you bend and lift. Many people have less discomfortwhen lifting if they bend their knees,  keep the load close to their bodies,and avoid twisting. Often, the most comfortable positions are those that put less stress on your recovering back.  Find a comfortable position to sleep. Use a firm mattress and lie on your side with your knees slightly bent. If you lie on your back, put a pillow under your knees.  Only take over-the-counter or prescription medicines as directed by your caregiver. Over-the-counter medicines to reduce pain and inflammation are often the most helpful.Your caregiver may prescribe muscle relaxant drugs.These medicines help dull your pain so you can more quickly return to your normal activities and healthy exercise.  Put ice on the injured area.  Put ice in a plastic bag.  Place a towel between your skin and the bag.  Leave the ice on for 15-20 minutes, 3-4 times a day for the first 2 to 3 days. After that, ice and heat may be alternated to reduce pain and spasms.  Ask your caregiver about trying back exercises and gentle massage. This may be of some benefit.  Avoid feeling anxious or stressed.Stress increases muscle tension and can worsen back pain.It is important to recognize when you are anxious or stressed and learn ways to manage it.Exercise is a great option. SEEK MEDICAL CARE IF:  You have pain that is not relieved with rest or medicine.  You have pain that does not improve in 1 week.  You have new  symptoms.  You are generally not feeling well. SEEK IMMEDIATE MEDICAL CARE IF:   You have pain that radiates from your back into your legs.  You develop new bowel or bladder control problems.  You have unusual weakness or numbness in your arms or legs.  You develop nausea or vomiting.  You develop abdominal pain.  You feel faint. Document Released: 05/27/2005 Document Revised: 11/26/2011 Document Reviewed: 10/15/2010 [employer] Patient Information 2014 [place], [place].

## 2012-12-25 NOTE — Assessment & Plan Note (Signed)
Unable to increase her Effexor for monetary reasons her insurance covers the 150 but not 225. Due to increasing stressors will add Elavil 10 mg qhs and see again in 3 weeks.

## 2012-12-25 NOTE — Assessment & Plan Note (Signed)
Agree with attempts to stop menstrual flow for 3 months continuously to see if that helps her chronic issues with pain.

## 2012-12-26 LAB — SEDIMENTATION RATE: Sed Rate: 6 mm/hr (ref 0–22)

## 2012-12-26 LAB — URINALYSIS
Ketones, ur: NEGATIVE mg/dL
Nitrite: NEGATIVE
Specific Gravity, Urine: 1.028 (ref 1.005–1.030)
Urobilinogen, UA: 0.2 mg/dL (ref 0.0–1.0)

## 2012-12-26 LAB — RHEUMATOID FACTOR: Rhuematoid fact SerPl-aCnc: <10 IU/mL (ref <=14)

## 2012-12-27 LAB — CULTURE, URINE COMPREHENSIVE

## 2012-12-28 ENCOUNTER — Ambulatory Visit: Payer: BC Managed Care – PPO | Admitting: Family Medicine

## 2013-01-12 ENCOUNTER — Other Ambulatory Visit: Payer: Self-pay | Admitting: Family Medicine

## 2013-01-12 NOTE — Telephone Encounter (Signed)
Please check with patient to see if she is still taking, was taking in past

## 2013-01-12 NOTE — Telephone Encounter (Signed)
Is patient taking Seroquel?

## 2013-01-12 NOTE — Telephone Encounter (Signed)
Left a detailed message on vm for patient to return my call 

## 2013-01-13 NOTE — Telephone Encounter (Signed)
I spoke to patient and she stated she is no longer taking this medication

## 2013-01-22 ENCOUNTER — Other Ambulatory Visit (HOSPITAL_COMMUNITY)
Admission: RE | Admit: 2013-01-22 | Discharge: 2013-01-22 | Disposition: A | Discharge status: Home / Self Care | Payer: BC Managed Care – PPO | Source: Ambulatory Visit | Attending: Family Medicine | Admitting: Family Medicine

## 2013-01-22 ENCOUNTER — Encounter: Payer: Self-pay | Admitting: Family Medicine

## 2013-01-22 ENCOUNTER — Ambulatory Visit (INDEPENDENT_AMBULATORY_CARE_PROVIDER_SITE_OTHER): Payer: BC Managed Care – PPO | Admitting: Family Medicine

## 2013-01-22 ENCOUNTER — Ambulatory Visit: Payer: BC Managed Care – PPO | Admitting: Family Medicine

## 2013-01-22 VITALS — BP 110/80 | HR 106 | Temp 98.1°F | Ht 64.0 in | Wt 200.1 lb

## 2013-01-22 DIAGNOSIS — Z113 Encounter for screening for infections with a predominantly sexual mode of transmission: Secondary | ICD-10-CM | POA: Insufficient documentation

## 2013-01-22 DIAGNOSIS — M549 Dorsalgia, unspecified: Secondary | ICD-10-CM

## 2013-01-22 DIAGNOSIS — N760 Acute vaginitis: Secondary | ICD-10-CM

## 2013-01-22 DIAGNOSIS — F419 Anxiety disorder, unspecified: Secondary | ICD-10-CM

## 2013-01-22 DIAGNOSIS — R000 Tachycardia, unspecified: Secondary | ICD-10-CM

## 2013-01-22 DIAGNOSIS — F341 Dysthymic disorder: Secondary | ICD-10-CM

## 2013-01-22 DIAGNOSIS — F411 Generalized anxiety disorder: Secondary | ICD-10-CM

## 2013-01-22 DIAGNOSIS — E663 Overweight: Secondary | ICD-10-CM

## 2013-01-22 DIAGNOSIS — N390 Urinary tract infection, site not specified: Secondary | ICD-10-CM

## 2013-01-22 LAB — CBC
Hemoglobin: 12.4 g/dL (ref 12.0–15.0)
MCV: 85.3 fL (ref 78.0–100.0)
Platelets: 354 10*3/uL (ref 150–400)
RBC: 4.3 MIL/uL (ref 3.87–5.11)
WBC: 6.4 10*3/uL (ref 4.0–10.5)

## 2013-01-22 MED ORDER — QUETIAPINE FUMARATE 100 MG PO TABS: 100.0000 mg | ORAL_TABLET | Freq: Every day | ORAL | Status: DC

## 2013-01-22 MED ORDER — AMITRIPTYLINE HCL 25 MG PO TABS: 25.0000 mg | ORAL_TABLET | Freq: Every day | ORAL | Status: DC

## 2013-01-22 MED ORDER — QUETIAPINE FUMARATE 300 MG PO TABS: 300.0000 mg | ORAL_TABLET | Freq: Every day | ORAL | Status: DC

## 2013-01-24 NOTE — Progress Notes (Signed)
Patient ID: Kathryn Randall, female   DOB: January 19, 1991, 22 y.o.   MRN: [id number] HENNA DERDERIAN [id number] [birth year] 01/24/2013      Progress Note-Follow Up  Subjective  Chief Complaint  Chief Complaint  Patient presents with  . Follow-up    1 month    HPI  Patient is a 22 year old Caucasian female who is in today for followup. She was having trouble to her pharmacy and has been out of his for several days. She is anxious and irritable when she comes and hearing she denies any recent illness. No chest pain or palpitations. No shortness of breath GI complaints. Does have some urinary frequency but no dysuria. No fevers or chills. She acknowledges persistent high levels of stress. Continues to have difficulty with her family but also with her boyfriend. Is having difficulty finding a job. Has persistent low back and right lower extremity pain as well. Medications are partially helpful  Past Medical History  Diagnosis Date  . Anxiety   . Depression   . Back pain   . Migraine 06/28/2011  . Allergic state 06/28/2011  . Preventative health care 06/28/2011  . Anxiety and depression 07/17/2011  . UTI (lower urinary tract infection) 08/18/2011  . Tachycardia 11/14/2011  . Anemia 11/14/2011  . Hypokalemia 11/14/2011  . Overweight(278.02) 04/27/2012  . Dysmenorrhea 05/26/2012  . Neck strain 06/28/2012    Past Surgical History  Procedure Laterality Date  . Wisdom tooth extraction  22 yrs old    Family History  Problem Relation Age of Onset  . Migraines Mother   . Arthritis Mother   . Hearing loss Mother   . Menstrual problems Mother   . Migraines Father   . Obesity Maternal Grandmother   . Heart disease Maternal Grandmother     CHF  . Hypertension Maternal Grandmother   . Hearing loss Maternal Grandmother   . Arthritis Maternal Grandmother   . Menstrual problems Maternal Grandmother   . Diabetes Maternal Grandfather     type 2  . Obesity Maternal Grandfather   . Leukemia  Paternal Grandfather   . Migraines Paternal Grandfather     History   Social History  . Marital Status: Single    Spouse Name: N/A    Number of Children: N/A  . Years of Education: N/A   Occupational History  . Not on file.   Social History Main Topics  . Smoking status: Never Smoker   . Smokeless tobacco: Never Used     Comment: black and milds every once in awhile  . Alcohol Use: No  . Drug Use: 7.00 per week    Special: Hydrocodone  . Sexual Activity: Not Currently    Partners: Male   Other Topics Concern  . Not on file   Social History Narrative  . No narrative on file    Current Outpatient Prescriptions on File Prior to Visit  Medication Sig Dispense Refill  . ALPRAZolam (XANAX) 1 MG tablet Take 1 tablet (1 mg total) by mouth 2 (two) times daily as needed for anxiety.  40 tablet  1  . amitriptyline (ELAVIL) 10 MG tablet Take 1 tablet (10 mg total) by mouth at bedtime.  30 tablet  1  . Drospirenone-Ethinyl Estradiol-Levomefol (BEYAZ) 3-0.02-0.451 MG tablet Start Sunday after starting menses. 1 Active Tab po qd for 90 days, then 1 levomefolate tab po qd X 7days. Then repeat cycle.  3 Package  4  . HYDROcodone-acetaminophen (NORCO) 7.5-325 MG per tablet Take 1 tablet  by mouth every 8 (eight) hours as needed for pain.  60 tablet  1  . methocarbamol (ROBAXIN) 500 MG tablet Take 1 tablet (500 mg total) by mouth 3 (three) times daily as needed.  90 tablet  1  . metoprolol (LOPRESSOR) 50 MG tablet Take 1 tablet (50 mg total) by mouth 2 (two) times daily.  60 tablet  3  . promethazine (PHENERGAN) 25 MG tablet Take 1 tablet (25 mg total) by mouth every 8 (eight) hours as needed for nausea.  40 tablet  0  . rizatriptan (MAXALT) 10 MG tablet Take 1 tablet (10 mg total) by mouth as needed for migraine. May repeat in 2 hours if needed  9 tablet  1  . venlafaxine XR (EFFEXOR-XR) 150 MG 24 hr capsule Take 1 capsule (150 mg total) by mouth daily.  30 capsule  2   No current  facility-administered medications on file prior to visit.    Allergies  Allergen Reactions  . Ibuprofen     Racing heart  . Morphine And Related Itching    Review of Systems  Review of Systems  Constitutional: Negative for fever and malaise/fatigue.  HENT: Negative for congestion.   Eyes: Negative for discharge.  Respiratory: Negative for shortness of breath.   Cardiovascular: Negative for chest pain, palpitations and leg swelling.  Gastrointestinal: Negative for nausea, abdominal pain and diarrhea.  Genitourinary: Negative for dysuria.  Musculoskeletal: Negative for falls.  Skin: Negative for rash.  Neurological: Negative for loss of consciousness and headaches.  Endo/Heme/Allergies: Negative for polydipsia.  Psychiatric/Behavioral: Negative for depression and suicidal ideas. The patient is not nervous/anxious and does not have insomnia.     Objective  BP 110/80  Pulse 106  Temp(Src) 98.1 F (36.7 C) (Oral)  Ht 5\' 4"  (1.626 m)  Wt 200 lb 1.3 oz (90.756 kg)  BMI 34.33 kg/m2  SpO2 95%  LMP 12/22/2012  Physical Exam  Physical Exam  Constitutional: She is oriented to person, place, and time and well-developed, well-nourished, and in no distress. No distress.  HENT:  Head: Normocephalic and atraumatic.  Eyes: Conjunctivae are normal.  Neck: Neck supple. No thyromegaly present.  Cardiovascular: Normal rate, regular rhythm and normal heart sounds.   No murmur heard. Pulmonary/Chest: Effort normal and breath sounds normal. She has no wheezes.  Abdominal: She exhibits no distension and no mass.  Musculoskeletal: She exhibits no edema.  Lymphadenopathy:    She has no cervical adenopathy.  Neurological: She is alert and oriented to person, place, and time.  Skin: Skin is warm and dry. No rash noted. She is not diaphoretic.  Psychiatric: Memory, affect and judgment normal.    Lab Results  Component Value Date   TSH 1.21 04/27/2012   Lab Results  Component Value  Date   WBC 6.4 01/22/2013   HGB 12.4 01/22/2013   HCT 36.7 01/22/2013   MCV 85.3 01/22/2013   PLT 354 01/22/2013   Lab Results  Component Value Date   CREATININE 0.98 12/25/2012   BUN 16 12/25/2012   NA 142 12/25/2012   K 4.5 12/25/2012   CL 103 12/25/2012   CO2 23 12/25/2012   Lab Results  Component Value Date   ALT 8 12/25/2012   AST 10 12/25/2012   ALKPHOS 59 12/25/2012   BILITOT 0.3 12/25/2012   Lab Results  Component Value Date   CHOL 163 07/17/2011   Lab Results  Component Value Date   HDL 81.90 07/17/2011   Lab Results  Component  Value Date   LDLCALC 53 07/17/2011   Lab Results  Component Value Date   TRIG 140.0 07/17/2011   Lab Results  Component Value Date   CHOLHDL 2 07/17/2011     Assessment & Plan  UTI (lower urinary tract infection) May have recurrent UTI have sent UA for culture.   Tachycardia Improved on recheck, paatient very anxious. Will continue Bystolic for now and she will report worsening symptoms. Avoid caffeine  Overweight(278.02) Encouraged DASH diet, decreased po intake and increased exercise.   Anxiety and depression Patient still struggling, was having trouble with her pharmacy and has been out of her Seroquel for several days. Restarted on 100 mg and titrate back up to 300. Referred to psychiatry for further treatment. Encouraged counseling as well as meds.  Back pain Low back pain with persistent right lower extremity radiculopathy, referred to orthopaedic for further consideration due to persistent and worsening pain. Encouraged increased activity, stretching and may continue pain meds prn

## 2013-01-24 NOTE — Assessment & Plan Note (Signed)
Improved on recheck, paatient very anxious. Will continue Bystolic for now and she will report worsening symptoms. Avoid caffeine

## 2013-01-24 NOTE — Assessment & Plan Note (Signed)
Encouraged DASH diet, decreased po intake and increased exercise.

## 2013-01-24 NOTE — Assessment & Plan Note (Signed)
Patient still struggling, was having trouble with her pharmacy and has been out of her Seroquel for several days. Restarted on 100 mg and titrate back up to 300. Referred to psychiatry for further treatment. Encouraged counseling as well as meds.

## 2013-01-24 NOTE — Assessment & Plan Note (Signed)
May have recurrent UTI have sent UA for culture.

## 2013-01-24 NOTE — Assessment & Plan Note (Signed)
Low back pain with persistent right lower extremity radiculopathy, referred to orthopaedic for further consideration due to persistent and worsening pain. Encouraged increased activity, stretching and may continue pain meds prn

## 2013-01-25 ENCOUNTER — Other Ambulatory Visit (HOSPITAL_COMMUNITY): Admission: RE | Admit: 2013-01-25 | Payer: BC Managed Care – PPO | Source: Ambulatory Visit | Admitting: Family Medicine

## 2013-01-25 ENCOUNTER — Telehealth: Payer: Self-pay

## 2013-01-25 LAB — URINALYSIS

## 2013-01-25 MED ORDER — NITROFURANTOIN MONOHYD MACRO 100 MG PO CAPS: 100.0000 mg | ORAL_CAPSULE | Freq: Two times a day (BID) | ORAL | Status: DC

## 2013-01-25 NOTE — Telephone Encounter (Signed)
Message copied by Court Joy on Mon Jan 25, 2013 11:56 AM ------      Message from: Danise Edge A      Created: Mon Jan 25, 2013 11:50 AM       So since no urine culture would treat empirically with Macrobid 100 mg po bid x 5 days ------

## 2013-01-25 NOTE — Telephone Encounter (Signed)
Left a detailed message on vm.  RX sent to pharmacy 

## 2013-02-11 ENCOUNTER — Other Ambulatory Visit: Payer: Self-pay | Admitting: Family Medicine

## 2013-02-15 ENCOUNTER — Telehealth: Payer: Self-pay

## 2013-02-15 ENCOUNTER — Other Ambulatory Visit: Payer: Self-pay | Admitting: Family Medicine

## 2013-02-15 DIAGNOSIS — M549 Dorsalgia, unspecified: Secondary | ICD-10-CM

## 2013-02-15 NOTE — Telephone Encounter (Signed)
Patient called and left a message stating she needs her xanax and Hydrocodone refilled.  I have been denying both of these since last Friday. Both RX's were done on 12-25-12 with 1 refill. Pt shouldn't need these until the middle of next week?

## 2013-02-15 NOTE — Telephone Encounter (Signed)
These medications are not due until 02-25-13

## 2013-02-15 NOTE — Telephone Encounter (Signed)
She can have them at the end of the [facility] but without extra refills and it has to last 30 days

## 2013-02-16 NOTE — Telephone Encounter (Signed)
I tried to call patient no vm will hold on to this until the end of the week

## 2013-02-16 NOTE — Telephone Encounter (Signed)
Pt informed. Will hold until Friday

## 2013-02-17 MED ORDER — HYDROCODONE-ACETAMINOPHEN 7.5-325 MG PO TABS: 1.0000 | ORAL_TABLET | Freq: Three times a day (TID) | ORAL | Status: DC | PRN

## 2013-02-17 MED ORDER — ALPRAZOLAM 1 MG PO TABS: 1.0000 mg | ORAL_TABLET | Freq: Two times a day (BID) | ORAL | Status: DC | PRN

## 2013-02-17 NOTE — Telephone Encounter (Signed)
Will fax in the morning.

## 2013-02-19 ENCOUNTER — Ambulatory Visit (INDEPENDENT_AMBULATORY_CARE_PROVIDER_SITE_OTHER): Payer: BC Managed Care – PPO | Admitting: Family Medicine

## 2013-02-19 ENCOUNTER — Encounter: Payer: Self-pay | Admitting: Family Medicine

## 2013-02-19 VITALS — BP 102/70 | HR 89 | Temp 98.6°F | Ht 64.0 in | Wt 203.0 lb

## 2013-02-19 DIAGNOSIS — F329 Major depressive disorder, single episode, unspecified: Secondary | ICD-10-CM

## 2013-02-19 DIAGNOSIS — M549 Dorsalgia, unspecified: Secondary | ICD-10-CM

## 2013-02-19 DIAGNOSIS — F341 Dysthymic disorder: Secondary | ICD-10-CM

## 2013-02-19 DIAGNOSIS — N390 Urinary tract infection, site not specified: Secondary | ICD-10-CM

## 2013-02-19 DIAGNOSIS — Z5189 Encounter for other specified aftercare: Secondary | ICD-10-CM

## 2013-02-19 DIAGNOSIS — T7840XD Allergy, unspecified, subsequent encounter: Secondary | ICD-10-CM

## 2013-02-19 DIAGNOSIS — G4700 Insomnia, unspecified: Secondary | ICD-10-CM

## 2013-02-19 DIAGNOSIS — R000 Tachycardia, unspecified: Secondary | ICD-10-CM

## 2013-02-19 MED ORDER — AMITRIPTYLINE HCL 25 MG PO TABS: ORAL_TABLET | ORAL | Status: DC

## 2013-02-19 NOTE — Patient Instructions (Addendum)
Probiotics such as Digestive Advantage or a generic Get a night guard for the teeth  For the wrist keep wearing the splint and don't fall, ice 2 x day for 10 minutes then apply Aspercreme or Salon Pas cream  Insomnia Insomnia is frequent trouble falling and/or staying asleep. Insomnia can be a long term problem or a short term problem. Both are common. Insomnia can be a short term problem when the wakefulness is related to a certain stress or worry. Long term insomnia is often related to ongoing stress during waking hours and/or poor sleeping habits. Overtime, sleep deprivation itself can make the problem worse. Every little thing feels more severe because you are overtired and your ability to cope is decreased. CAUSES   Stress, anxiety, and depression.  Poor sleeping habits.  Distractions such as TV in the bedroom.  Naps close to bedtime.  Engaging in emotionally charged conversations before bed.  Technical reading before sleep.  Alcohol and other sedatives. They may make the problem worse. They can hurt normal sleep patterns and normal dream activity.  Stimulants such as caffeine for several hours prior to bedtime.  Pain syndromes and shortness of breath can cause insomnia.  Exercise late at night.  Changing time zones may cause sleeping problems (jet lag). It is sometimes helpful to have someone observe your sleeping patterns. They should look for periods of not breathing during the night (sleep apnea). They should also look to see how long those periods last. If you live alone or observers are uncertain, you can also be observed at a sleep clinic where your sleep patterns will be professionally monitored. Sleep apnea requires a checkup and treatment. Give your caregivers your medical history. Give your caregivers observations your family has made about your sleep.  SYMPTOMS   Not feeling rested in the morning.  Anxiety and restlessness at bedtime.  Difficulty falling and  staying asleep. TREATMENT   Your caregiver may prescribe treatment for an underlying medical disorders. Your caregiver can give advice or help if you are using alcohol or other drugs for self-medication. Treatment of underlying problems will usually eliminate insomnia problems.  Medications can be prescribed for short time use. They are generally not recommended for lengthy use.  Over-the-counter sleep medicines are not recommended for lengthy use. They can be habit forming.  You can promote easier sleeping by making lifestyle changes such as:  Using relaxation techniques that help with breathing and reduce muscle tension.  Exercising earlier in the day.  Changing your diet and the time of your last meal. No night time snacks.  Establish a regular time to go to bed.  Counseling can help with stressful problems and worry.  Soothing music and white noise may be helpful if there are background noises you cannot remove.  Stop tedious detailed work at least one hour before bedtime. HOME CARE INSTRUCTIONS   Keep a diary. Inform your caregiver about your progress. This includes any medication side effects. See your caregiver regularly. Take note of:  Times when you are asleep.  Times when you are awake during the night.  The quality of your sleep.  How you feel the next day. This information will help your caregiver care for you.  Get out of bed if you are still awake after 15 minutes. Read or do some quiet activity. Keep the lights down. Wait until you feel sleepy and go back to bed.  Keep regular sleeping and waking hours. Avoid naps.  Exercise regularly.  Avoid distractions  at bedtime. Distractions include watching television or engaging in any intense or detailed activity like attempting to balance the household checkbook.  Develop a bedtime ritual. Keep a familiar routine of bathing, brushing your teeth, climbing into bed at the same time each night, listening to soothing  music. Routines increase the success of falling to sleep faster.  Use relaxation techniques. This can be using breathing and muscle tension release routines. It can also include visualizing peaceful scenes. You can also help control troubling or intruding thoughts by keeping your mind occupied with boring or repetitive thoughts like the old concept of counting sheep. You can make it more creative like imagining planting one beautiful flower after another in your backyard garden.  During your day, work to eliminate stress. When this is not possible use some of the previous suggestions to help reduce the anxiety that accompanies stressful situations. MAKE SURE YOU:   Understand these instructions.  Will watch your condition.  Will get help right away if you are not doing well or get worse. Document Released: 05/24/2000 Document Revised: 08/19/2011 Document Reviewed: 06/24/2007 [facility] Patient Information 2014 [place], [place].

## 2013-02-21 ENCOUNTER — Encounter: Payer: Self-pay | Admitting: Family Medicine

## 2013-02-21 DIAGNOSIS — G4700 Insomnia, unspecified: Secondary | ICD-10-CM | POA: Insufficient documentation

## 2013-02-21 HISTORY — DX: Insomnia, unspecified: G47.00

## 2013-02-21 NOTE — Assessment & Plan Note (Signed)
May try Elavil 1-2 tabs po qhs as needed

## 2013-02-21 NOTE — Assessment & Plan Note (Signed)
She feels she is doing somewhat better now.

## 2013-02-21 NOTE — Assessment & Plan Note (Signed)
Right ear pressure for past 1-2 weeks. No sharp pain. SOM noted. No further changes.

## 2013-02-21 NOTE — Assessment & Plan Note (Signed)
Resolved, asymptomatic .

## 2013-02-21 NOTE — Progress Notes (Signed)
Patient ID: Kathryn Randall, female   DOB: 15-Sep-1990, 22 y.o.   MRN: [id number] ADALAIDE JASKOLSKI [id number] 1991-01-12 02/21/2013      Progress Note-Follow Up  Subjective  Chief Complaint  Chief Complaint  Patient presents with  . Follow-up    4 week  . Injections    flu  . Otalgia    right ear aches X 1.5 weeks    HPI Patient a 22 year old Caucasian female who is in today for follow up. She's complaining of some mild allergic symptoms and some aching in her right ear curette a week to week and a half lately. Has some intermittent shoulder pain as well. No trauma or fall. No recent illness. Denies chest pain, palpitations, shortness of breath, GI or GU complaints at this time.   Past Medical History  Diagnosis Date  . Anxiety   . Depression   . Back pain   . Migraine 06/28/2011  . Allergic state 06/28/2011  . Preventative health care 06/28/2011  . Anxiety and depression 07/17/2011  . UTI (lower urinary tract infection) 08/18/2011  . Tachycardia 11/14/2011  . Anemia 11/14/2011  . Hypokalemia 11/14/2011  . Overweight(278.02) 04/27/2012  . Dysmenorrhea 05/26/2012  . Neck strain 06/28/2012  . Insomnia 02/21/2013    Past Surgical History  Procedure Laterality Date  . Wisdom tooth extraction  22 yrs old    Family History  Problem Relation Age of Onset  . Migraines Mother   . Arthritis Mother   . Hearing loss Mother   . Menstrual problems Mother   . Migraines Father   . Obesity Maternal Grandmother   . Heart disease Maternal Grandmother     CHF  . Hypertension Maternal Grandmother   . Hearing loss Maternal Grandmother   . Arthritis Maternal Grandmother   . Menstrual problems Maternal Grandmother   . Diabetes Maternal Grandfather     type 2  . Obesity Maternal Grandfather   . Leukemia Paternal Grandfather   . Migraines Paternal Grandfather     History   Social History  . Marital Status: Single    Spouse Name: N/A    Number of Children: N/A  . Years of Education:  N/A   Occupational History  . Not on file.   Social History Main Topics  . Smoking status: Never Smoker   . Smokeless tobacco: Never Used     Comment: black and milds every once in awhile  . Alcohol Use: No  . Drug Use: 7.00 per week    Special: Hydrocodone  . Sexual Activity: Not Currently    Partners: Male   Other Topics Concern  . Not on file   Social History Narrative  . No narrative on file    Current Outpatient Prescriptions on File Prior to Visit  Medication Sig Dispense Refill  . ALPRAZolam (XANAX) 1 MG tablet Take 1 tablet (1 mg total) by mouth 2 (two) times daily as needed for anxiety.  40 tablet  0  . Drospirenone-Ethinyl Estradiol-Levomefol (BEYAZ) 3-0.02-0.451 MG tablet Start Sunday after starting menses. 1 Active Tab po qd for 90 days, then 1 levomefolate tab po qd X 7days. Then repeat cycle.  3 Package  4  . HYDROcodone-acetaminophen (NORCO) 7.5-325 MG per tablet Take 1 tablet by mouth every 8 (eight) hours as needed for pain.  60 tablet  0  . methocarbamol (ROBAXIN) 500 MG tablet Take 1 tablet (500 mg total) by mouth 3 (three) times daily as needed.  90 tablet  1  .  metoprolol (LOPRESSOR) 50 MG tablet Take 1 tablet (50 mg total) by mouth 2 (two) times daily.  60 tablet  3  . promethazine (PHENERGAN) 25 MG tablet Take 1 tablet (25 mg total) by mouth every 8 (eight) hours as needed for nausea.  40 tablet  0  . QUEtiapine (SEROQUEL) 300 MG tablet Take 1 tablet (300 mg total) by mouth at bedtime.  30 tablet  1  . rizatriptan (MAXALT) 10 MG tablet Take 1 tablet (10 mg total) by mouth as needed for migraine. May repeat in 2 hours if needed  9 tablet  1  . venlafaxine XR (EFFEXOR-XR) 150 MG 24 hr capsule Take 1 capsule (150 mg total) by mouth daily.  30 capsule  2   No current facility-administered medications on file prior to visit.    Allergies  Allergen Reactions  . Ibuprofen     Racing heart  . Morphine And Related Itching    Review of Systems Review of  Systems  Constitutional: Negative for fever and malaise/fatigue.  HENT: Negative for congestion.   Eyes: Negative for discharge.  Respiratory: Negative for shortness of breath.   Cardiovascular: Negative for chest pain, palpitations and leg swelling.  Gastrointestinal: Negative for nausea, abdominal pain and diarrhea.  Genitourinary: Negative for dysuria.  Musculoskeletal: Negative for falls.  Skin: Negative for rash.  Neurological: Negative for loss of consciousness and headaches.  Endo/Heme/Allergies: Negative for polydipsia.  Psychiatric/Behavioral: Negative for depression and suicidal ideas. The patient is not nervous/anxious and does not have insomnia.     Objective  BP 102/70  Pulse 89  Temp(Src) 98.6 F (37 C) (Oral)  Ht 5\' 4"  (1.626 m)  Wt 203 lb 0.6 oz (92.098 kg)  BMI 34.83 kg/m2  SpO2 97%  LMP 02/15/2013  Physical Exam  Physical Exam  Constitutional: She is oriented to person, place, and time and well-developed, well-nourished, and in no distress. No distress.  HENT:  Head: Normocephalic and atraumatic.  Eyes: Conjunctivae are normal.  Neck: Neck supple. No thyromegaly present.  Cardiovascular: Normal rate, regular rhythm and normal heart sounds.   No murmur heard. Pulmonary/Chest: Effort normal and breath sounds normal. She has no wheezes.  Abdominal: She exhibits no distension and no mass.  Musculoskeletal: She exhibits no edema.  Lymphadenopathy:    She has no cervical adenopathy.  Neurological: She is alert and oriented to person, place, and time.  Skin: Skin is warm and dry. No rash noted. She is not diaphoretic.  Psychiatric: Memory, affect and judgment normal.    Lab Results  Component Value Date   TSH 1.21 04/27/2012   Lab Results  Component Value Date   WBC 6.4 01/22/2013   HGB 12.4 01/22/2013   HCT 36.7 01/22/2013   MCV 85.3 01/22/2013   PLT 354 01/22/2013   Lab Results  Component Value Date   CREATININE 0.98 12/25/2012   BUN 16 12/25/2012    NA 142 12/25/2012   K 4.5 12/25/2012   CL 103 12/25/2012   CO2 23 12/25/2012   Lab Results  Component Value Date   ALT 8 12/25/2012   AST 10 12/25/2012   ALKPHOS 59 12/25/2012   BILITOT 0.3 12/25/2012   Lab Results  Component Value Date   CHOL 163 07/17/2011   Lab Results  Component Value Date   HDL 81.90 07/17/2011   Lab Results  Component Value Date   LDLCALC 53 07/17/2011   Lab Results  Component Value Date   TRIG 140.0 07/17/2011  Lab Results  Component Value Date   CHOLHDL 2 07/17/2011     Assessment & Plan  UTI (lower urinary tract infection) Resolved, asymptomatic  Allergic state Right ear pressure for past 1-2 weeks. No sharp pain. SOM noted. No further changes.  Tachycardia Well controlled on current meds  Anxiety and depression She feels she is doing somewhat better now.   Insomnia May try Elavil 1-2 tabs po qhs as needed

## 2013-02-21 NOTE — Assessment & Plan Note (Signed)
Well-controlled on current meds 

## 2013-03-04 ENCOUNTER — Encounter: Payer: Self-pay | Admitting: Physician Assistant

## 2013-03-04 ENCOUNTER — Ambulatory Visit (INDEPENDENT_AMBULATORY_CARE_PROVIDER_SITE_OTHER): Payer: BC Managed Care – PPO | Admitting: Physician Assistant

## 2013-03-04 ENCOUNTER — Telehealth: Payer: Self-pay

## 2013-03-04 VITALS — BP 110/80 | HR 126 | Temp 99.3°F | Resp 16 | Ht 64.0 in | Wt 202.8 lb

## 2013-03-04 DIAGNOSIS — J329 Chronic sinusitis, unspecified: Secondary | ICD-10-CM

## 2013-03-04 MED ORDER — DOXYCYCLINE HYCLATE 50 MG PO CAPS: 100.0000 mg | ORAL_CAPSULE | Freq: Two times a day (BID) | ORAL | Status: DC

## 2013-03-04 MED ORDER — DOXYCYCLINE HYCLATE 100 MG PO TABS: 100.0000 mg | ORAL_TABLET | Freq: Two times a day (BID) | ORAL | Status: DC

## 2013-03-04 NOTE — Telephone Encounter (Signed)
Pt scheduled appt for today at 3:45 with [facility]

## 2013-03-04 NOTE — Assessment & Plan Note (Signed)
Rx doxycycline given concern for possible concomitant bronchitis.  Fluids, rest, saline nasal spray, daily claritin or zyrtec.  Patient advised to avoid decongestants giving her history of tachycardia.

## 2013-03-04 NOTE — Progress Notes (Signed)
Patient ID: Kathryn Randall, female   DOB: [birth year], 22 y.o.   MRN: [id number]  Patient presents to clinic today c/o head congestion, sinus pain, pressure, green nasal discharge, cough productive of green sputum, ear pressure for 7-9 days.  Endorses feeling feverish, but denies checking temperature.  Denies rash.  Denies N/V, abdominal pain.  Endorses history of multiple sinus infections each year requiring antibiotic therapy.  Denies history of allergy or asthma.   Past Medical History  Diagnosis Date  . Anxiety   . Depression   . Back pain   . Migraine 06/28/2011  . Allergic state 06/28/2011  . Preventative health care 06/28/2011  . Anxiety and depression 07/17/2011  . UTI (lower urinary tract infection) 08/18/2011  . Tachycardia 11/14/2011  . Anemia 11/14/2011  . Hypokalemia 11/14/2011  . Overweight(278.02) 04/27/2012  . Dysmenorrhea 05/26/2012  . Neck strain 06/28/2012  . Insomnia 02/21/2013    Current Outpatient Prescriptions on File Prior to Visit  Medication Sig Dispense Refill  . ALPRAZolam (XANAX) 1 MG tablet Take 1 tablet (1 mg total) by mouth 2 (two) times daily as needed for anxiety.  40 tablet  0  . amitriptyline (ELAVIL) 25 MG tablet 1-2 tabs qhs prn insomnia  30 tablet  1  . Drospirenone-Ethinyl Estradiol-Levomefol (BEYAZ) 3-0.02-0.451 MG tablet Start Sunday after starting menses. 1 Active Tab po qd for 90 days, then 1 levomefolate tab po qd X 7days. Then repeat cycle.  3 Package  4  . HYDROcodone-acetaminophen (NORCO) 7.5-325 MG per tablet Take 1 tablet by mouth every 8 (eight) hours as needed for pain.  60 tablet  0  . methocarbamol (ROBAXIN) 500 MG tablet Take 1 tablet (500 mg total) by mouth 3 (three) times daily as needed.  90 tablet  1  . metoprolol (LOPRESSOR) 50 MG tablet Take 1 tablet (50 mg total) by mouth 2 (two) times daily.  60 tablet  3  . promethazine (PHENERGAN) 25 MG tablet Take 1 tablet (25 mg total) by mouth every 8 (eight) hours as needed for nausea.  40 tablet   0  . QUEtiapine (SEROQUEL) 300 MG tablet Take 1 tablet (300 mg total) by mouth at bedtime.  30 tablet  1  . rizatriptan (MAXALT) 10 MG tablet Take 1 tablet (10 mg total) by mouth as needed for migraine. May repeat in 2 hours if needed  9 tablet  1  . venlafaxine XR (EFFEXOR-XR) 150 MG 24 hr capsule Take 1 capsule (150 mg total) by mouth daily.  30 capsule  2   No current facility-administered medications on file prior to visit.    Allergies  Allergen Reactions  . Ibuprofen     Racing heart  . Morphine And Related Itching    Family History  Problem Relation Age of Onset  . Migraines Mother   . Arthritis Mother   . Hearing loss Mother   . Menstrual problems Mother   . Migraines Father   . Obesity Maternal Grandmother   . Heart disease Maternal Grandmother     CHF  . Hypertension Maternal Grandmother   . Hearing loss Maternal Grandmother   . Arthritis Maternal Grandmother   . Menstrual problems Maternal Grandmother   . Diabetes Maternal Grandfather     type 2  . Obesity Maternal Grandfather   . Leukemia Paternal Grandfather   . Migraines Paternal Grandfather     History   Social History  . Marital Status: Single    Spouse Name: N/A  Number of Children: N/A  . Years of Education: N/A   Social History Main Topics  . Smoking status: Never Smoker   . Smokeless tobacco: Never Used     Comment: black and milds every once in awhile  . Alcohol Use: No  . Drug Use: 7.00 per week    Special: Hydrocodone  . Sexual Activity: Not Currently    Partners: Male   Other Topics Concern  . None   Social History Narrative  . None   ROS See HPI.   Filed Vitals:   03/04/13 1558  BP: 110/80  Pulse: 126  Temp: 99.3 F (37.4 C)  Resp: 16   Physical Exam  Constitutional: She is oriented to person, place, and time and well-developed, well-nourished, and in no distress.  HENT:  Head: Normocephalic and atraumatic.  Right Ear: External ear normal.  Left Ear: External ear  normal.  Nose: Nose normal.  Mouth/Throat: Oropharynx is clear and moist. No oropharyngeal exudate.  TM WNL bilaterally.  Pain/Tenderness with percussion of maxillary sinuses.  Eyes: Conjunctivae are normal.  Neck: Neck supple.  Cardiovascular: Regular rhythm, normal heart sounds and intact distal pulses.   tachycardic  Pulmonary/Chest: Effort normal and breath sounds normal. No respiratory distress. She has no wheezes. She has no rales. She exhibits no tenderness.  Lymphadenopathy:    She has no cervical adenopathy.  Neurological: She is alert and oriented to person, place, and time.  Skin: Skin is warm and dry. No rash noted.   Recent Results (from the past 2160 hour(s))  ANA     Status: None   Collection Time    12/18/12  2:58 PM      Result Value Range   ANA NEG  NEGATIVE  ANGIOTENSIN CONVERTING ENZYME     Status: None   Collection Time    12/18/12  2:58 PM      Result Value Range   Angiotensin-Converting Enzyme 32  8 - 52 U/L  CK     Status: None   Collection Time    12/18/12  2:58 PM      Result Value Range   Total CK 40  7 - 177 U/L  RHEUMATOID FACTOR     Status: None   Collection Time    12/25/12 11:37 AM      Result Value Range   Rheumatoid Factor <10  <=14 IU/mL   Comment:                                Interpretive Table                         Low Positive: 15 - 41 IU/mL                         High Positive:  >= 42 IU/mL            In addition to the RF result, and clinical symptoms including joint      involvement, the 2010 ACR Classification Criteria for      scoring/diagnosing Rheumatoid Arthritis include the results of the      following tests:  CRP (16109), ESR (15010), and CCP (AP[facility]) (60454).      www.rheumatology.org/practice/clinical/classification/ra/ra_2010.asp  URINALYSIS     Status: Abnormal   Collection Time    12/25/12 11:37 AM      Result Value  Range   Color, Urine YELLOW  YELLOW   APPearance CLEAR  CLEAR   Specific Gravity, Urine 1.028   1.005 - 1.030   pH 6.0  5.0 - 8.0   Glucose, UA NEG  NEG mg/dL   Bilirubin Urine NEG  NEG   Ketones, ur NEG  NEG mg/dL   Hgb urine dipstick MOD (*) NEG   Protein, ur NEG  NEG mg/dL   Urobilinogen, UA 0.2  0.0 - 1.0 mg/dL   Nitrite NEG  NEG   Leukocytes, UA SMALL (*) NEG  CULTURE, URINE COMPREHENSIVE     Status: None   Collection Time    12/25/12 11:37 AM      Result Value Range   Colony Count 10,000 COLONIES/ML     Organism ID, Bacteria DIPTHEROIDS (CORYNEBACTERIUM SPECIES)     Comment: Standardized susceptibility testing for this     organism is not available.  RENAL FUNCTION PANEL     Status: Abnormal   Collection Time    12/25/12 11:37 AM      Result Value Range   Sodium 142  135 - 145 mEq/L   Potassium 4.5  3.5 - 5.3 mEq/L   Chloride 103  96 - 112 mEq/L   CO2 23  19 - 32 mEq/L   Glucose, Bld 65 (*) 70 - 99 mg/dL   BUN 16  6 - 23 mg/dL   Creat 1.61  0.96 - 0.45 mg/dL   Albumin 4.1  3.5 - 5.2 g/dL   Calcium 9.2  8.4 - 40.9 mg/dL   Phosphorus 4.4  2.3 - 4.6 mg/dL  HEPATIC FUNCTION PANEL     Status: None   Collection Time    12/25/12 11:37 AM      Result Value Range   Total Bilirubin 0.3  0.3 - 1.2 mg/dL   Bilirubin, Direct 0.1  0.0 - 0.3 mg/dL   Indirect Bilirubin 0.2  0.0 - 0.9 mg/dL   Alkaline Phosphatase 59  39 - 117 U/L   AST 10  0 - 37 U/L   ALT 8  0 - 35 U/L   Total Protein 6.9  6.0 - 8.3 g/dL   Albumin 4.1  3.5 - 5.2 g/dL  CBC     Status: Abnormal   Collection Time    12/25/12 11:37 AM      Result Value Range   WBC 10.7 (*) 4.0 - 10.5 K/uL   RBC 4.29  3.87 - 5.11 MIL/uL   Hemoglobin 12.2  12.0 - 15.0 g/dL   HCT 81.1  91.4 - 78.2 %   MCV 85.5  78.0 - 100.0 fL   MCH 28.4  26.0 - 34.0 pg   MCHC 33.2  30.0 - 36.0 g/dL   RDW 95.6  21.3 - 08.6 %   Platelets 392  150 - 400 K/uL  SEDIMENTATION RATE     Status: None   Collection Time    12/25/12 11:37 AM      Result Value Range   Sed Rate 6  0 - 22 mm/hr  CBC     Status: None   Collection Time     01/22/13  4:02 PM      Result Value Range   WBC 6.4  4.0 - 10.5 K/uL   RBC 4.30  3.87 - 5.11 MIL/uL   Hemoglobin 12.4  12.0 - 15.0 g/dL   HCT 57.8  46.9 - 62.9 %   MCV 85.3  78.0 - 100.0 fL  MCH 28.8  26.0 - 34.0 pg   MCHC 33.8  30.0 - 36.0 g/dL   RDW 45.4  09.8 - 11.9 %   Platelets 354  150 - 400 K/uL  URINE CULTURE     Status: None   Collection Time    01/22/13  4:02 PM      Result Value Range   Colony Count NO GROWTH     Organism ID, Bacteria NO GROWTH    URINALYSIS     Status: None   Collection Time    01/22/13  4:02 PM      Result Value Range   Color, Urine TEST NOT PERFORMED  YELLOW   Comment: NO URINE CUP   APPearance TEST NOT PERFORMED  CLEAR   Specific Gravity, Urine TEST NOT PERFORMED  1.005 - 1.030   pH TEST NOT PERFORMED  5.0 - 8.0   Glucose, UA TEST NOT PERFORMED  NEG mg/dL   Bilirubin Urine TEST NOT PERFORMED  NEG   Ketones, ur TEST NOT PERFORMED  NEG mg/dL   Hgb urine dipstick TEST NOT PERFORMED  NEG   Protein, ur TEST NOT PERFORMED  NEG mg/dL   Urobilinogen, UA TEST NOT PERFORMED  0.0 - 1.0 mg/dL   Nitrite TEST NOT PERFORMED  NEG   Leukocytes, UA TEST NOT PERFORMED  NEG    Assessment/Plan: Sinusitis Rx doxycycline given concern for possible concomitant bronchitis.  Fluids, rest, saline nasal spray, daily claritin or zyrtec.  Patient advised to avoid decongestants giving her history of tachycardia.

## 2013-03-17 ENCOUNTER — Other Ambulatory Visit: Payer: Self-pay | Admitting: Family Medicine

## 2013-03-22 ENCOUNTER — Telehealth: Payer: Self-pay

## 2013-03-22 DIAGNOSIS — M549 Dorsalgia, unspecified: Secondary | ICD-10-CM

## 2013-03-22 MED ORDER — HYDROCODONE-ACETAMINOPHEN 7.5-325 MG PO TABS: 1.0000 | ORAL_TABLET | Freq: Three times a day (TID) | ORAL | Status: DC | PRN

## 2013-03-22 MED ORDER — ALPRAZOLAM 1 MG PO TABS: 1.0000 mg | ORAL_TABLET | Freq: Two times a day (BID) | ORAL | Status: DC | PRN

## 2013-03-22 NOTE — Telephone Encounter (Signed)
OK to refill both with same sig, same strength, no refills

## 2013-03-22 NOTE — Telephone Encounter (Signed)
Needs Hydrocodone refilled and xanax. Last Hydrocodone refill was 02-17-13 quantity 60 with 0 refills Last Xanax refill was 02-17-13 quantity 40 with 0 refills   Please advise?

## 2013-03-22 NOTE — Telephone Encounter (Signed)
Patient informed that these are RX's are at the front desk

## 2013-04-01 ENCOUNTER — Other Ambulatory Visit: Payer: Self-pay | Admitting: Family Medicine

## 2013-04-02 ENCOUNTER — Ambulatory Visit (INDEPENDENT_AMBULATORY_CARE_PROVIDER_SITE_OTHER): Payer: BC Managed Care – PPO | Admitting: Family Medicine

## 2013-04-02 ENCOUNTER — Encounter: Payer: Self-pay | Admitting: Family Medicine

## 2013-04-02 VITALS — BP 110/82 | HR 81 | Temp 98.7°F | Ht 64.0 in | Wt 208.1 lb

## 2013-04-02 DIAGNOSIS — R52 Pain, unspecified: Secondary | ICD-10-CM

## 2013-04-02 DIAGNOSIS — R350 Frequency of micturition: Secondary | ICD-10-CM

## 2013-04-02 DIAGNOSIS — M549 Dorsalgia, unspecified: Secondary | ICD-10-CM

## 2013-04-02 DIAGNOSIS — N390 Urinary tract infection, site not specified: Secondary | ICD-10-CM

## 2013-04-02 DIAGNOSIS — R000 Tachycardia, unspecified: Secondary | ICD-10-CM

## 2013-04-02 LAB — CBC
HCT: 36.2 % (ref 36.0–46.0)
MCH: 28.1 pg (ref 26.0–34.0)
MCHC: 32.9 g/dL (ref 30.0–36.0)
Platelets: 396 10*3/uL (ref 150–400)
RDW: 14.6 % (ref 11.5–15.5)

## 2013-04-02 MED ORDER — SULFAMETHOXAZOLE-TMP DS 800-160 MG PO TABS: 1.0000 | ORAL_TABLET | Freq: Two times a day (BID) | ORAL | Status: DC

## 2013-04-02 MED ORDER — OXYCODONE-ACETAMINOPHEN 7.5-325 MG PO TABS: 1.0000 | ORAL_TABLET | ORAL | Status: DC | PRN

## 2013-04-02 MED ORDER — PHENAZOPYRIDINE HCL 200 MG PO TABS: 200.0000 mg | ORAL_TABLET | Freq: Three times a day (TID) | ORAL | Status: DC | PRN

## 2013-04-02 MED ORDER — CARVEDILOL 12.5 MG PO TABS: 12.5000 mg | ORAL_TABLET | Freq: Two times a day (BID) | ORAL | Status: DC

## 2013-04-02 NOTE — Patient Instructions (Signed)
Urinary Tract Infection  Urinary tract infections (UTIs) can develop anywhere along your urinary tract. Your urinary tract is your body's drainage system for removing wastes and extra water. Your urinary tract includes two kidneys, two ureters, a bladder, and a urethra. Your kidneys are a pair of bean-shaped organs. Each kidney is about the size of your fist. They are located below your ribs, one on each side of your spine.  CAUSES  Infections are caused by microbes, which are microscopic organisms, including fungi, viruses, and bacteria. These organisms are so small that they can only be seen through a microscope. Bacteria are the microbes that most commonly cause UTIs.  SYMPTOMS   Symptoms of UTIs may vary by age and gender of the patient and by the location of the infection. Symptoms in young women typically include a frequent and intense urge to urinate and a painful, burning feeling in the bladder or urethra during urination. Older women and men are more likely to be tired, shaky, and weak and have muscle aches and abdominal pain. A fever may mean the infection is in your kidneys. Other symptoms of a kidney infection include pain in your back or sides below the ribs, nausea, and vomiting.  DIAGNOSIS  To diagnose a UTI, your caregiver will ask you about your symptoms. Your caregiver also will ask to provide a urine sample. The urine sample will be tested for bacteria and white blood cells. White blood cells are made by your body to help fight infection.  TREATMENT   Typically, UTIs can be treated with medication. Because most UTIs are caused by a bacterial infection, they usually can be treated with the use of antibiotics. The choice of antibiotic and length of treatment depend on your symptoms and the type of bacteria causing your infection.  HOME CARE INSTRUCTIONS   If you were prescribed antibiotics, take them exactly as your caregiver instructs you. Finish the medication even if you feel better after you  have only taken some of the medication.   Drink enough water and fluids to keep your urine clear or pale yellow.   Avoid caffeine, tea, and carbonated beverages. They tend to irritate your bladder.   Empty your bladder often. Avoid holding urine for long periods of time.   Empty your bladder before and after sexual intercourse.   After a bowel movement, women should cleanse from front to back. Use each tissue only once.  SEEK MEDICAL CARE IF:    You have back pain.   You develop a fever.   Your symptoms do not begin to resolve within 3 days.  SEEK IMMEDIATE MEDICAL CARE IF:    You have severe back pain or lower abdominal pain.   You develop chills.   You have nausea or vomiting.   You have continued burning or discomfort with urination.  MAKE SURE YOU:    Understand these instructions.   Will watch your condition.   Will get help right away if you are not doing well or get worse.  Document Released: 03/06/2005 Document Revised: 11/26/2011 Document Reviewed: 07/05/2011  [employer] Patient Information 2014 [employer]

## 2013-04-03 LAB — RENAL FUNCTION PANEL
Albumin: 3.8 g/dL (ref 3.5–5.2)
CO2: 27 mEq/L (ref 19–32)
Calcium: 9 mg/dL (ref 8.4–10.5)
Creat: 0.86 mg/dL (ref 0.50–1.10)
Glucose, Bld: 79 mg/dL (ref 70–99)
Potassium: 4.1 mEq/L (ref 3.5–5.3)

## 2013-04-03 LAB — HEPATIC FUNCTION PANEL
ALT: 9 U/L (ref 0–35)
AST: 15 U/L (ref 0–37)
Albumin: 3.8 g/dL (ref 3.5–5.2)

## 2013-04-03 LAB — TSH: TSH: 2.392 u[IU]/mL (ref 0.350–4.500)

## 2013-04-03 LAB — URINALYSIS
Glucose, UA: NEGATIVE mg/dL
Specific Gravity, Urine: 1.027 (ref 1.005–1.030)
pH: 6 (ref 5.0–8.0)

## 2013-04-04 LAB — URINE CULTURE: Colony Count: 75000

## 2013-04-04 NOTE — Assessment & Plan Note (Signed)
Well controlled but struggling with constipation, fatigue and weight gain, will try switching from Metoprolol to Carvedilol and reassesss

## 2013-04-04 NOTE — Assessment & Plan Note (Signed)
Given a small amount of Percocet to use sparingly for pain,

## 2013-04-04 NOTE — Progress Notes (Signed)
Patient ID: Kathryn Randall, female   DOB: [birth year], 22 y.o.   MRN: [id number] Kathryn Randall [id number] [birth year] 04/04/2013      Progress Note-Follow Up  Subjective  Chief Complaint  Chief Complaint  Patient presents with  . Urinary Tract Infection    lower back pain, pain w/ urination, urine frequency X 2 days    HPI  Patient is a 22 year old Caucasian female who is in today complaining of 2 days with his dysuria, urinary frequency and urgency. She has some low-grade malaise and myalgias but no fevers or chills. No GI complaints. Has persistent back pain but no recent falls. No chest pain, palpitations or shortness of breath.  Past Medical History  Diagnosis Date  . Anxiety   . Depression   . Back pain   . Migraine 06/28/2011  . Allergic state 06/28/2011  . Preventative health care 06/28/2011  . Anxiety and depression 07/17/2011  . UTI (lower urinary tract infection) 08/18/2011  . Tachycardia 11/14/2011  . Anemia 11/14/2011  . Hypokalemia 11/14/2011  . Overweight(278.02) 04/27/2012  . Dysmenorrhea 05/26/2012  . Neck strain 06/28/2012  . Insomnia 02/21/2013    Past Surgical History  Procedure Laterality Date  . Wisdom tooth extraction  22 yrs old    Family History  Problem Relation Age of Onset  . Migraines Mother   . Arthritis Mother   . Hearing loss Mother   . Menstrual problems Mother   . Migraines Father   . Obesity Maternal Grandmother   . Heart disease Maternal Grandmother     CHF  . Hypertension Maternal Grandmother   . Hearing loss Maternal Grandmother   . Arthritis Maternal Grandmother   . Menstrual problems Maternal Grandmother   . Diabetes Maternal Grandfather     type 2  . Obesity Maternal Grandfather   . Leukemia Paternal Grandfather   . Migraines Paternal Grandfather     History   Social History  . Marital Status: Single    Spouse Name: N/A    Number of Children: N/A  . Years of Education: N/A   Occupational History  . Not on file.    Social History Main Topics  . Smoking status: Never Smoker   . Smokeless tobacco: Never Used     Comment: black and milds every once in awhile  . Alcohol Use: No  . Drug Use: 7.00 per week    Special: Hydrocodone  . Sexual Activity: Not Currently    Partners: Male   Other Topics Concern  . Not on file   Social History Narrative  . No narrative on file    Current Outpatient Prescriptions on File Prior to Visit  Medication Sig Dispense Refill  . ALPRAZolam (XANAX) 1 MG tablet Take 1 tablet (1 mg total) by mouth 2 (two) times daily as needed for anxiety.  40 tablet  0  . amitriptyline (ELAVIL) 25 MG tablet TAKE 1 TABLET BY MOUTH EVERY NIGHT AT BEDTIME  30 tablet  1  . doxycycline (VIBRA-TABS) 100 MG tablet Take 1 tablet (100 mg total) by mouth 2 (two) times daily.  20 tablet  0  . Drospirenone-Ethinyl Estradiol-Levomefol (BEYAZ) 3-0.02-0.451 MG tablet Start Sunday after starting menses. 1 Active Tab po qd for 90 days, then 1 levomefolate tab po qd X 7days. Then repeat cycle.  3 Package  4  . HYDROcodone-acetaminophen (NORCO) 7.5-325 MG per tablet Take 1 tablet by mouth every 8 (eight) hours as needed for pain.  60 tablet  0  .  methocarbamol (ROBAXIN) 500 MG tablet Take 1 tablet (500 mg total) by mouth 3 (three) times daily as needed.  90 tablet  1  . promethazine (PHENERGAN) 25 MG tablet Take 1 tablet (25 mg total) by mouth every 8 (eight) hours as needed for nausea.  40 tablet  0  . QUEtiapine (SEROQUEL) 300 MG tablet Take 1 tablet (300 mg total) by mouth at bedtime.  30 tablet  1  . rizatriptan (MAXALT) 10 MG tablet Take 1 tablet (10 mg total) by mouth as needed for migraine. May repeat in 2 hours if needed  9 tablet  1  . venlafaxine XR (EFFEXOR-XR) 150 MG 24 hr capsule TAKE 1 CAPSULE BY MOUTH DAILY  30 capsule  5   No current facility-administered medications on file prior to visit.    Allergies  Allergen Reactions  . Ibuprofen     Racing heart  . Morphine And Related  Itching    Review of Systems  Review of Systems  Constitutional: Positive for malaise/fatigue. Negative for fever.  HENT: Negative for congestion.   Eyes: Negative for discharge.  Respiratory: Negative for shortness of breath.   Cardiovascular: Negative for chest pain, palpitations and leg swelling.  Gastrointestinal: Negative for nausea, abdominal pain and diarrhea.  Genitourinary: Positive for dysuria, urgency and frequency. Negative for hematuria and flank pain.  Musculoskeletal: Positive for back pain and myalgias. Negative for falls.  Skin: Negative for rash.  Neurological: Negative for loss of consciousness and headaches.  Endo/Heme/Allergies: Negative for polydipsia.  Psychiatric/Behavioral: Negative for depression and suicidal ideas. The patient is not nervous/anxious and does not have insomnia.     Objective  BP 110/82  Pulse 81  Temp(Src) 98.7 F (37.1 C) (Oral)  Ht 5\' 4"  (1.626 m)  Wt 208 lb 1.9 oz (94.403 kg)  BMI 35.71 kg/m2  SpO2 97%  LMP 04/01/2013  Physical Exam  Physical Exam  Constitutional: She is oriented to person, place, and time and well-developed, well-nourished, and in no distress. No distress.  HENT:  Head: Normocephalic and atraumatic.  Right Ear: External ear normal.  Left Ear: External ear normal.  Nose: Nose normal.  Mouth/Throat: Oropharynx is clear and moist. No oropharyngeal exudate.  Eyes: Conjunctivae are normal. Pupils are equal, round, and reactive to light. Right eye exhibits no discharge. Left eye exhibits no discharge. No scleral icterus.  Neck: Normal range of motion. Neck supple. No thyromegaly present.  Cardiovascular: Normal rate, regular rhythm, normal heart sounds and intact distal pulses.   No murmur heard. Pulmonary/Chest: Effort normal and breath sounds normal. No respiratory distress. She has no wheezes. She has no rales.  Abdominal: Soft. Bowel sounds are normal. She exhibits no distension and no mass. There is no  tenderness.  Musculoskeletal: Normal range of motion. She exhibits no edema and no tenderness.  Lymphadenopathy:    She has no cervical adenopathy.  Neurological: She is alert and oriented to person, place, and time. She has normal reflexes. No cranial nerve deficit. Coordination normal.  Skin: Skin is warm and dry. No rash noted. She is not diaphoretic.  Psychiatric: Mood, memory and affect normal.    Lab Results  Component Value Date   TSH 2.392 04/02/2013   Lab Results  Component Value Date   WBC 6.0 04/02/2013   HGB 11.9* 04/02/2013   HCT 36.2 04/02/2013   MCV 85.6 04/02/2013   PLT 396 04/02/2013   Lab Results  Component Value Date   CREATININE 0.86 04/02/2013   BUN 12  04/02/2013   NA 137 04/02/2013   K 4.1 04/02/2013   CL 106 04/02/2013   CO2 27 04/02/2013   Lab Results  Component Value Date   ALT 9 04/02/2013   AST 15 04/02/2013   ALKPHOS 70 04/02/2013   BILITOT 0.2* 04/02/2013   Lab Results  Component Value Date   CHOL 163 07/17/2011   Lab Results  Component Value Date   HDL 81.90 07/17/2011   Lab Results  Component Value Date   LDLCALC 53 07/17/2011   Lab Results  Component Value Date   TRIG 140.0 07/17/2011   Lab Results  Component Value Date   CHOLHDL 2 07/17/2011     Assessment & Plan  UTI (lower urinary tract infection) Urine culture mixed flora but significant growth. Is given antibiotic due to symptoms and Pyridium and probiotics, increase rest and hydration  Back pain Given a small amount of Percocet to use sparingly for pain,  Tachycardia Well controlled but struggling with constipation, fatigue and weight gain, will try switching from Metoprolol to Carvedilol and reassesss

## 2013-04-04 NOTE — Assessment & Plan Note (Signed)
Urine culture mixed flora but significant growth. Is given antibiotic due to symptoms and Pyridium and probiotics, increase rest and hydration

## 2013-04-15 NOTE — Progress Notes (Signed)
Quick Note:  Patient Informed and voiced understanding ______ 

## 2013-04-21 ENCOUNTER — Other Ambulatory Visit: Payer: Self-pay | Admitting: Family Medicine

## 2013-04-22 NOTE — Telephone Encounter (Signed)
eScribe request for refill on Seroquel Last filled - 08.15.14, #[id number] Last AEX - 10.24.14 Next AEX - 4-wks Please Advise/SLS

## 2013-04-23 ENCOUNTER — Ambulatory Visit (INDEPENDENT_AMBULATORY_CARE_PROVIDER_SITE_OTHER): Payer: BC Managed Care – PPO | Admitting: Family Medicine

## 2013-04-23 ENCOUNTER — Encounter: Payer: Self-pay | Admitting: Family Medicine

## 2013-04-23 VITALS — BP 100/72 | HR 98 | Temp 98.2°F | Ht 64.0 in | Wt 209.1 lb

## 2013-04-23 DIAGNOSIS — M549 Dorsalgia, unspecified: Secondary | ICD-10-CM

## 2013-04-23 DIAGNOSIS — F341 Dysthymic disorder: Secondary | ICD-10-CM

## 2013-04-23 DIAGNOSIS — T7840XD Allergy, unspecified, subsequent encounter: Secondary | ICD-10-CM

## 2013-04-23 DIAGNOSIS — R000 Tachycardia, unspecified: Secondary | ICD-10-CM

## 2013-04-23 DIAGNOSIS — Z5189 Encounter for other specified aftercare: Secondary | ICD-10-CM

## 2013-04-23 DIAGNOSIS — N390 Urinary tract infection, site not specified: Secondary | ICD-10-CM

## 2013-04-23 DIAGNOSIS — F411 Generalized anxiety disorder: Secondary | ICD-10-CM

## 2013-04-23 DIAGNOSIS — G43909 Migraine, unspecified, not intractable, without status migrainosus: Secondary | ICD-10-CM

## 2013-04-23 DIAGNOSIS — F329 Major depressive disorder, single episode, unspecified: Secondary | ICD-10-CM

## 2013-04-23 MED ORDER — ALPRAZOLAM 1 MG PO TABS: 1.0000 mg | ORAL_TABLET | Freq: Two times a day (BID) | ORAL | Status: DC | PRN

## 2013-04-23 MED ORDER — HYDROCODONE-ACETAMINOPHEN 7.5-325 MG PO TABS: 1.0000 | ORAL_TABLET | Freq: Four times a day (QID) | ORAL | Status: DC | PRN

## 2013-04-23 NOTE — Progress Notes (Signed)
Pre visit review using our clinic review tool, if applicable. No additional management support is needed unless otherwise documented below in the visit note. 

## 2013-04-25 ENCOUNTER — Encounter: Payer: Self-pay | Admitting: Family Medicine

## 2013-04-25 NOTE — Assessment & Plan Note (Signed)
Well controlled, no changes 

## 2013-04-25 NOTE — Assessment & Plan Note (Signed)
Stable on current meds. Encouraged adequate hydration and sleep

## 2013-04-25 NOTE — Progress Notes (Signed)
Patient ID: Kathryn Randall, female   DOB: [birth year], 22 y.o.   MRN: [id number] CYNDEL GRIFFEY [id number] 20-Feb-1991 04/25/2013      Progress Note-Follow Up  Subjective  Chief Complaint  Chief Complaint  Patient presents with  . Follow-up    2 month    HPI  Patient is a 22 year old Caucasian female who is in today for followup she is doing well. She's recently taken a job. Her only recent testing or palpitations. No shortness of breath GI or GU concerns. Is taking medications as prescribed. Is having less frequent migraine and her allergies are generally well controlled at this time  Past Medical History  Diagnosis Date  . Anxiety   . Depression   . Back pain   . Migraine 06/28/2011  . Allergic state 06/28/2011  . Preventative health care 06/28/2011  . Anxiety and depression 07/17/2011  . UTI (lower urinary tract infection) 08/18/2011  . Tachycardia 11/14/2011  . Anemia 11/14/2011  . Hypokalemia 11/14/2011  . Overweight(278.02) 04/27/2012  . Dysmenorrhea 05/26/2012  . Neck strain 06/28/2012  . Insomnia 02/21/2013    Past Surgical History  Procedure Laterality Date  . Wisdom tooth extraction  22 yrs old    Family History  Problem Relation Age of Onset  . Migraines Mother   . Arthritis Mother   . Hearing loss Mother   . Menstrual problems Mother   . Migraines Father   . Obesity Maternal Grandmother   . Heart disease Maternal Grandmother     CHF  . Hypertension Maternal Grandmother   . Hearing loss Maternal Grandmother   . Arthritis Maternal Grandmother   . Menstrual problems Maternal Grandmother   . Diabetes Maternal Grandfather     type 2  . Obesity Maternal Grandfather   . Leukemia Paternal Grandfather   . Migraines Paternal Grandfather     History   Social History  . Marital Status: Single    Spouse Name: N/A    Number of Children: N/A  . Years of Education: N/A   Occupational History  . Not on file.   Social History Main Topics  . Smoking status:  Never Smoker   . Smokeless tobacco: Never Used     Comment: black and milds every once in awhile  . Alcohol Use: No  . Drug Use: 7.00 per week    Special: Hydrocodone  . Sexual Activity: Not Currently    Partners: Male   Other Topics Concern  . Not on file   Social History Narrative  . No narrative on file    Current Outpatient Prescriptions on File Prior to Visit  Medication Sig Dispense Refill  . amitriptyline (ELAVIL) 25 MG tablet TAKE 1 TABLET BY MOUTH EVERY NIGHT AT BEDTIME  30 tablet  1  . carvedilol (COREG) 12.5 MG tablet Take 1 tablet (12.5 mg total) by mouth 2 (two) times daily with a meal.  60 tablet  3  . doxycycline (VIBRA-TABS) 100 MG tablet Take 1 tablet (100 mg total) by mouth 2 (two) times daily.  20 tablet  0  . Drospirenone-Ethinyl Estradiol-Levomefol (BEYAZ) 3-0.02-0.451 MG tablet Start Sunday after starting menses. 1 Active Tab po qd for 90 days, then 1 levomefolate tab po qd X 7days. Then repeat cycle.  3 Package  4  . methocarbamol (ROBAXIN) 500 MG tablet Take 1 tablet (500 mg total) by mouth 3 (three) times daily as needed.  90 tablet  1  . phenazopyridine (PYRIDIUM) 200 MG tablet Take 1 tablet (  200 mg total) by mouth 3 (three) times daily as needed for pain.  10 tablet  1  . promethazine (PHENERGAN) 25 MG tablet Take 1 tablet (25 mg total) by mouth every 8 (eight) hours as needed for nausea.  40 tablet  0  . QUEtiapine (SEROQUEL) 300 MG tablet TAKE 1 TABLET BY MOUTH EVERY NIGHT AT BEDTIME  30 tablet  1  . rizatriptan (MAXALT) 10 MG tablet Take 1 tablet (10 mg total) by mouth as needed for migraine. May repeat in 2 hours if needed  9 tablet  1  . venlafaxine XR (EFFEXOR-XR) 150 MG 24 hr capsule TAKE 1 CAPSULE BY MOUTH DAILY  30 capsule  5   No current facility-administered medications on file prior to visit.    Allergies  Allergen Reactions  . Ibuprofen     Racing heart  . Morphine And Related Itching    Review of Systems  Review of Systems   Constitutional: Negative for fever and malaise/fatigue.  HENT: Negative for congestion.   Eyes: Negative for discharge.  Respiratory: Negative for shortness of breath.   Cardiovascular: Negative for chest pain, palpitations and leg swelling.  Gastrointestinal: Negative for nausea, abdominal pain and diarrhea.  Genitourinary: Negative for dysuria.  Musculoskeletal: Positive for back pain. Negative for falls.  Skin: Negative for rash.  Neurological: Negative for loss of consciousness and headaches.  Endo/Heme/Allergies: Negative for polydipsia.  Psychiatric/Behavioral: Positive for depression. Negative for suicidal ideas. The patient is nervous/anxious. The patient does not have insomnia.     Objective  BP 100/72  Pulse 98  Temp(Src) 98.2 F (36.8 C) (Oral)  Ht 5\' 4"  (1.626 m)  Wt 209 lb 1.3 oz (94.838 kg)  BMI 35.87 kg/m2  SpO2 98%  LMP 04/01/2013  Physical Exam  Physical Exam  Constitutional: She is oriented to person, place, and time and well-developed, well-nourished, and in no distress. No distress.  HENT:  Head: Normocephalic and atraumatic.  Eyes: Conjunctivae are normal.  Neck: Neck supple. No thyromegaly present.  Cardiovascular: Normal rate, regular rhythm and normal heart sounds.   No murmur heard. Pulmonary/Chest: Effort normal and breath sounds normal. She has no wheezes.  Abdominal: She exhibits no distension and no mass.  Musculoskeletal: She exhibits no edema.  Lymphadenopathy:    She has no cervical adenopathy.  Neurological: She is alert and oriented to person, place, and time.  Skin: Skin is warm and dry. No rash noted. She is not diaphoretic.  Psychiatric: Memory, affect and judgment normal.    Lab Results  Component Value Date   TSH 2.392 04/02/2013   Lab Results  Component Value Date   WBC 6.0 04/02/2013   HGB 11.9* 04/02/2013   HCT 36.2 04/02/2013   MCV 85.6 04/02/2013   PLT 396 04/02/2013   Lab Results  Component Value Date    CREATININE 0.86 04/02/2013   BUN 12 04/02/2013   NA 137 04/02/2013   K 4.1 04/02/2013   CL 106 04/02/2013   CO2 27 04/02/2013   Lab Results  Component Value Date   ALT 9 04/02/2013   AST 15 04/02/2013   ALKPHOS 70 04/02/2013   BILITOT 0.2* 04/02/2013   Lab Results  Component Value Date   CHOL 163 07/17/2011   Lab Results  Component Value Date   HDL 81.90 07/17/2011   Lab Results  Component Value Date   LDLCALC 53 07/17/2011   Lab Results  Component Value Date   TRIG 140.0 07/17/2011   Lab Results  Component Value Date   CHOLHDL 2 07/17/2011     Assessment & Plan  UTI (lower urinary tract infection) asymptomatic  Tachycardia Improved symptomatically  Anxiety and depression Doing well on current meds, no changes  Allergic state Well controlled, no changes  Migraine Stable on current meds. Encouraged adequate hydration and sleep

## 2013-04-25 NOTE — Assessment & Plan Note (Signed)
Improved symptomatically

## 2013-04-25 NOTE — Assessment & Plan Note (Signed)
Doing well on current meds, no changes 

## 2013-04-25 NOTE — Assessment & Plan Note (Signed)
asymptomatic

## 2013-04-27 ENCOUNTER — Other Ambulatory Visit: Payer: Self-pay | Admitting: Family Medicine

## 2013-05-11 ENCOUNTER — Other Ambulatory Visit: Payer: Self-pay | Admitting: Family Medicine

## 2013-05-20 ENCOUNTER — Other Ambulatory Visit: Payer: Self-pay | Admitting: Family Medicine

## 2013-06-08 ENCOUNTER — Ambulatory Visit (INDEPENDENT_AMBULATORY_CARE_PROVIDER_SITE_OTHER): Payer: BC Managed Care – PPO | Admitting: Family Medicine

## 2013-06-08 ENCOUNTER — Encounter: Payer: Self-pay | Admitting: Family Medicine

## 2013-06-08 VITALS — BP 104/70 | HR 88 | Temp 98.4°F | Ht 64.0 in | Wt 210.0 lb

## 2013-06-08 DIAGNOSIS — G43909 Migraine, unspecified, not intractable, without status migrainosus: Secondary | ICD-10-CM

## 2013-06-08 DIAGNOSIS — R000 Tachycardia, unspecified: Secondary | ICD-10-CM

## 2013-06-08 DIAGNOSIS — F341 Dysthymic disorder: Secondary | ICD-10-CM

## 2013-06-08 DIAGNOSIS — F329 Major depressive disorder, single episode, unspecified: Secondary | ICD-10-CM

## 2013-06-08 DIAGNOSIS — J209 Acute bronchitis, unspecified: Secondary | ICD-10-CM

## 2013-06-08 DIAGNOSIS — M549 Dorsalgia, unspecified: Secondary | ICD-10-CM

## 2013-06-08 MED ORDER — HYDROCODONE-HOMATROPINE 5-1.5 MG/5ML PO SYRP: 5.0000 mL | ORAL_SOLUTION | Freq: Every evening | ORAL | Status: DC | PRN

## 2013-06-08 MED ORDER — HYDROCODONE-ACETAMINOPHEN 7.5-325 MG PO TABS: 1.0000 | ORAL_TABLET | Freq: Four times a day (QID) | ORAL | Status: DC | PRN

## 2013-06-08 MED ORDER — CEFDINIR 300 MG PO CAPS: 300.0000 mg | ORAL_CAPSULE | Freq: Two times a day (BID) | ORAL | Status: AC

## 2013-06-08 MED ORDER — QUETIAPINE FUMARATE 400 MG PO TABS: 400.0000 mg | ORAL_TABLET | Freq: Every day | ORAL | Status: DC

## 2013-06-08 MED ORDER — METOPROLOL TARTRATE 50 MG PO TABS: 50.0000 mg | ORAL_TABLET | Freq: Two times a day (BID) | ORAL | Status: DC

## 2013-06-08 MED ORDER — ALBUTEROL SULFATE HFA 108 (90 BASE) MCG/ACT IN AERS: 2.0000 | INHALATION_SPRAY | Freq: Four times a day (QID) | RESPIRATORY_TRACT | Status: DC | PRN

## 2013-06-08 NOTE — Progress Notes (Signed)
Pre visit review using our clinic review tool, if applicable. No additional management support is needed unless otherwise documented below in the visit note. 

## 2013-06-08 NOTE — Patient Instructions (Signed)

## 2013-06-14 DIAGNOSIS — J209 Acute bronchitis, unspecified: Secondary | ICD-10-CM | POA: Insufficient documentation

## 2013-06-14 NOTE — Progress Notes (Signed)
Patient ID: Kathryn Randall, female   DOB: April 16, 1991, 23 y.o.   MRN: [id number] Kathryn Randall [id number] Jul 30, 1990 06/14/2013      Progress Note-Follow Up  Subjective  Chief Complaint  Chief Complaint  Patient presents with  . Follow-up  . Cough    X 2 weeks- neon green  . Excessive Sweating    HPI  Patient is a 23 year old Caucasian female who is in today for followup and has been struggling with worsening respiratory symptoms for the past 2 weeks. She is complaining of a cough which is keeping her up at night. His production of green phlegm. She's had congestion and ear pain most notably in the left. She is headache malaise myalgias and anorexia as well. She's had some low-grade sweating possibly fevers. She has used Mucinex, DayQuil, NyQuil with minimal effect. Denies any GI or GU complaints.  Past Medical History  Diagnosis Date  . Anxiety   . Depression   . Back pain   . Migraine 06/28/2011  . Allergic state 06/28/2011  . Preventative health care 06/28/2011  . Anxiety and depression 07/17/2011  . UTI (lower urinary tract infection) 08/18/2011  . Tachycardia 11/14/2011  . Anemia 11/14/2011  . Hypokalemia 11/14/2011  . Overweight(278.02) 04/27/2012  . Dysmenorrhea 05/26/2012  . Neck strain 06/28/2012  . Insomnia 02/21/2013    Past Surgical History  Procedure Laterality Date  . Wisdom tooth extraction  23 yrs old    Family History  Problem Relation Age of Onset  . Migraines Mother   . Arthritis Mother   . Hearing loss Mother   . Menstrual problems Mother   . Migraines Father   . Obesity Maternal Grandmother   . Heart disease Maternal Grandmother     CHF  . Hypertension Maternal Grandmother   . Hearing loss Maternal Grandmother   . Arthritis Maternal Grandmother   . Menstrual problems Maternal Grandmother   . Diabetes Maternal Grandfather     type 2  . Obesity Maternal Grandfather   . Leukemia Paternal Grandfather   . Migraines Paternal Grandfather      History   Social History  . Marital Status: Single    Spouse Name: N/A    Number of Children: N/A  . Years of Education: N/A   Occupational History  . Not on file.   Social History Main Topics  . Smoking status: Never Smoker   . Smokeless tobacco: Never Used     Comment: black and milds every once in awhile  . Alcohol Use: No  . Drug Use: 7.00 per week    Special: Hydrocodone  . Sexual Activity: Not Currently    Partners: Male   Other Topics Concern  . Not on file   Social History Narrative  . No narrative on file    Current Outpatient Prescriptions on File Prior to Visit  Medication Sig Dispense Refill  . ALPRAZolam (XANAX) 1 MG tablet Take 1 tablet (1 mg total) by mouth 2 (two) times daily as needed for anxiety.  40 tablet  1  . amitriptyline (ELAVIL) 25 MG tablet TAKE 1 TABLET BY MOUTH EVERY NIGHT AT BEDTIME  30 tablet  0  . doxycycline (VIBRA-TABS) 100 MG tablet Take 1 tablet (100 mg total) by mouth 2 (two) times daily.  20 tablet  0  . Drospirenone-Ethinyl Estradiol-Levomefol (BEYAZ) 3-0.02-0.451 MG tablet Start Sunday after starting menses. 1 Active Tab po qd for 90 days, then 1 levomefolate tab po qd X 7days. Then repeat cycle.  3 Package  4  . methocarbamol (ROBAXIN) 500 MG tablet Take 1 tablet (500 mg total) by mouth 3 (three) times daily as needed.  90 tablet  1  . phenazopyridine (PYRIDIUM) 200 MG tablet Take 1 tablet (200 mg total) by mouth 3 (three) times daily as needed for pain.  10 tablet  1  . promethazine (PHENERGAN) 25 MG tablet Take 1 tablet (25 mg total) by mouth every 8 (eight) hours as needed for nausea.  40 tablet  0  . rizatriptan (MAXALT) 10 MG tablet Take 1 tablet (10 mg total) by mouth as needed for migraine. May repeat in 2 hours if needed  9 tablet  1  . venlafaxine XR (EFFEXOR-XR) 150 MG 24 hr capsule TAKE 1 CAPSULE BY MOUTH DAILY  30 capsule  3   No current facility-administered medications on file prior to visit.    Allergies   Allergen Reactions  . Ibuprofen     Racing heart  . Morphine And Related Itching    Review of Systems  Review of Systems  Constitutional: Negative for fever and malaise/fatigue.  HENT: Positive for congestion and ear pain.   Eyes: Negative for discharge.  Respiratory: Positive for cough and sputum production. Negative for shortness of breath.   Cardiovascular: Negative for chest pain, palpitations and leg swelling.  Gastrointestinal: Negative for nausea, abdominal pain and diarrhea.  Genitourinary: Negative for dysuria.  Musculoskeletal: Negative for falls.  Skin: Negative for rash.  Neurological: Positive for headaches. Negative for loss of consciousness.  Endo/Heme/Allergies: Negative for polydipsia.  Psychiatric/Behavioral: Negative for depression and suicidal ideas. The patient is not nervous/anxious and does not have insomnia.     Objective  BP 104/70  Pulse 88  Temp(Src) 98.4 F (36.9 C) (Oral)  Ht 5\' 4"  (1.626 m)  Wt 210 lb 0.6 oz (95.274 kg)  BMI 36.04 kg/m2  SpO2 96%  LMP 06/07/2013  Physical Exam  Physical Exam  Constitutional: She is oriented to person, place, and time and well-developed, well-nourished, and in no distress. No distress.  HENT:  Head: Normocephalic and atraumatic.  Eyes: Conjunctivae are normal.  Neck: Neck supple. No thyromegaly present.  Cardiovascular: Normal rate and regular rhythm.  Exam reveals gallop.   No murmur heard. Pulmonary/Chest: Effort normal and breath sounds normal. She has no wheezes.  Abdominal: She exhibits no distension and no mass.  Musculoskeletal: She exhibits no edema.  Lymphadenopathy:    She has no cervical adenopathy.  Neurological: She is alert and oriented to person, place, and time.  Skin: Skin is warm and dry. No rash noted. She is not diaphoretic.  Psychiatric: Memory, affect and judgment normal.    Lab Results  Component Value Date   TSH 2.392 04/02/2013   Lab Results  Component Value Date    WBC 6.0 04/02/2013   HGB 11.9* 04/02/2013   HCT 36.2 04/02/2013   MCV 85.6 04/02/2013   PLT 396 04/02/2013   Lab Results  Component Value Date   CREATININE 0.86 04/02/2013   BUN 12 04/02/2013   NA 137 04/02/2013   K 4.1 04/02/2013   CL 106 04/02/2013   CO2 27 04/02/2013   Lab Results  Component Value Date   ALT 9 04/02/2013   AST 15 04/02/2013   ALKPHOS 70 04/02/2013   BILITOT 0.2* 04/02/2013   Lab Results  Component Value Date   CHOL 163 07/17/2011   Lab Results  Component Value Date   HDL 81.90 07/17/2011   Lab Results  Component Value Date   LDLCALC 53 07/17/2011   Lab Results  Component Value Date   TRIG 140.0 07/17/2011   Lab Results  Component Value Date   CHOLHDL 2 07/17/2011     Assessment & Plan  Acute bronchitis Started on Albuterol, Hycodan and Omnicef, increase rest and hydration.  Migraine No recent flares  Anxiety and depression Improved on current meds but not responding quite as well to Seroquel will increase to 400 mg daily  Tachycardia Well controlled, no changes  Back pain tolerable

## 2013-06-14 NOTE — Assessment & Plan Note (Signed)
No recent flares 

## 2013-06-14 NOTE — Assessment & Plan Note (Signed)
tolerable

## 2013-06-14 NOTE — Assessment & Plan Note (Signed)
Improved on current meds but not responding quite as well to Seroquel will increase to 400 mg daily

## 2013-06-14 NOTE — Assessment & Plan Note (Signed)
Well controlled, no changes 

## 2013-06-14 NOTE — Assessment & Plan Note (Signed)
Started on Albuterol, Hycodan and Omnicef, increase rest and hydration.

## 2013-06-24 ENCOUNTER — Ambulatory Visit (INDEPENDENT_AMBULATORY_CARE_PROVIDER_SITE_OTHER): Payer: BC Managed Care – PPO | Admitting: Family Medicine

## 2013-06-24 ENCOUNTER — Encounter: Payer: Self-pay | Admitting: Family Medicine

## 2013-06-24 VITALS — BP 102/74 | HR 85 | Temp 98.8°F | Ht 64.0 in | Wt 207.1 lb

## 2013-06-24 DIAGNOSIS — F329 Major depressive disorder, single episode, unspecified: Secondary | ICD-10-CM

## 2013-06-24 DIAGNOSIS — R000 Tachycardia, unspecified: Secondary | ICD-10-CM

## 2013-06-24 DIAGNOSIS — F419 Anxiety disorder, unspecified: Secondary | ICD-10-CM

## 2013-06-24 DIAGNOSIS — F411 Generalized anxiety disorder: Secondary | ICD-10-CM

## 2013-06-24 DIAGNOSIS — F341 Dysthymic disorder: Secondary | ICD-10-CM

## 2013-06-24 DIAGNOSIS — F32A Depression, unspecified: Secondary | ICD-10-CM

## 2013-06-24 DIAGNOSIS — F3289 Other specified depressive episodes: Secondary | ICD-10-CM

## 2013-06-24 DIAGNOSIS — R52 Pain, unspecified: Secondary | ICD-10-CM

## 2013-06-24 MED ORDER — HYDROCODONE-ACETAMINOPHEN 10-325 MG PO TABS: 1.0000 | ORAL_TABLET | Freq: Four times a day (QID) | ORAL | Status: DC | PRN

## 2013-06-24 MED ORDER — QUETIAPINE FUMARATE 100 MG PO TABS: ORAL_TABLET | ORAL | Status: DC

## 2013-06-24 MED ORDER — AMITRIPTYLINE HCL 25 MG PO TABS: ORAL_TABLET | ORAL | Status: DC

## 2013-06-24 MED ORDER — ALPRAZOLAM 1 MG PO TABS: 1.0000 mg | ORAL_TABLET | Freq: Two times a day (BID) | ORAL | Status: DC | PRN

## 2013-06-24 NOTE — Progress Notes (Signed)
Pre visit review using our clinic review tool, if applicable. No additional management support is needed unless otherwise documented below in the visit note. 

## 2013-06-25 ENCOUNTER — Ambulatory Visit: Payer: BC Managed Care – PPO | Admitting: Family Medicine

## 2013-06-27 ENCOUNTER — Encounter: Payer: Self-pay | Admitting: Family Medicine

## 2013-06-27 NOTE — Assessment & Plan Note (Signed)
Well controlled on beta blockade
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
## 2013-07-02 ENCOUNTER — Telehealth: Payer: Self-pay | Admitting: Family Medicine

## 2013-07-02 NOTE — Telephone Encounter (Signed)
Please advise 

## 2013-07-02 NOTE — Telephone Encounter (Signed)
Requesting something be called in for UTI [place] [facility] [phone]

## 2013-07-05 NOTE — Telephone Encounter (Signed)
OK to call in Ciprofloxacin 500mg  po bid x 3 days

## 2013-07-06 MED ORDER — CIPROFLOXACIN HCL 500 MG PO TABS: 500.0000 mg | ORAL_TABLET | Freq: Two times a day (BID) | ORAL | Status: DC

## 2013-07-09 ENCOUNTER — Ambulatory Visit: Payer: BC Managed Care – PPO | Admitting: Family Medicine

## 2013-07-09 DIAGNOSIS — Z0289 Encounter for other administrative examinations: Secondary | ICD-10-CM

## 2013-07-28 ENCOUNTER — Other Ambulatory Visit: Payer: Self-pay | Admitting: Family Medicine

## 2013-08-10 ENCOUNTER — Ambulatory Visit (INDEPENDENT_AMBULATORY_CARE_PROVIDER_SITE_OTHER): Payer: BC Managed Care – PPO | Admitting: Family Medicine

## 2013-08-10 ENCOUNTER — Encounter: Payer: Self-pay | Admitting: Family Medicine

## 2013-08-10 VITALS — BP 110/66 | HR 122 | Temp 98.2°F | Ht 64.0 in | Wt 207.0 lb

## 2013-08-10 DIAGNOSIS — D649 Anemia, unspecified: Secondary | ICD-10-CM

## 2013-08-10 DIAGNOSIS — F341 Dysthymic disorder: Secondary | ICD-10-CM

## 2013-08-10 DIAGNOSIS — N946 Dysmenorrhea, unspecified: Secondary | ICD-10-CM

## 2013-08-10 DIAGNOSIS — L259 Unspecified contact dermatitis, unspecified cause: Secondary | ICD-10-CM

## 2013-08-10 DIAGNOSIS — F419 Anxiety disorder, unspecified: Secondary | ICD-10-CM

## 2013-08-10 DIAGNOSIS — E162 Hypoglycemia, unspecified: Secondary | ICD-10-CM

## 2013-08-10 DIAGNOSIS — F329 Major depressive disorder, single episode, unspecified: Secondary | ICD-10-CM

## 2013-08-10 DIAGNOSIS — G43909 Migraine, unspecified, not intractable, without status migrainosus: Secondary | ICD-10-CM

## 2013-08-10 DIAGNOSIS — M549 Dorsalgia, unspecified: Secondary | ICD-10-CM

## 2013-08-10 DIAGNOSIS — R000 Tachycardia, unspecified: Secondary | ICD-10-CM

## 2013-08-10 DIAGNOSIS — R52 Pain, unspecified: Secondary | ICD-10-CM

## 2013-08-10 DIAGNOSIS — F411 Generalized anxiety disorder: Secondary | ICD-10-CM

## 2013-08-10 DIAGNOSIS — R110 Nausea: Secondary | ICD-10-CM

## 2013-08-10 DIAGNOSIS — L309 Dermatitis, unspecified: Secondary | ICD-10-CM

## 2013-08-10 LAB — TSH: TSH: 0.641 u[IU]/mL (ref 0.350–4.500)

## 2013-08-10 LAB — CBC
HCT: 38.3 % (ref 36.0–46.0)
Hemoglobin: 12.8 g/dL (ref 12.0–15.0)
MCH: 28.2 pg (ref 26.0–34.0)
MCHC: 33.4 g/dL (ref 30.0–36.0)
MCV: 84.4 fL (ref 78.0–100.0)
Platelets: 343 10*3/uL (ref 150–400)
RBC: 4.54 MIL/uL (ref 3.87–5.11)
RDW: 13.7 % (ref 11.5–15.5)
WBC: 5.3 10*3/uL (ref 4.0–10.5)

## 2013-08-10 LAB — RENAL FUNCTION PANEL
ALBUMIN: 4.1 g/dL (ref 3.5–5.2)
BUN: 11 mg/dL (ref 6–23)
CALCIUM: 9.1 mg/dL (ref 8.4–10.5)
CO2: 28 mEq/L (ref 19–32)
Chloride: 105 mEq/L (ref 96–112)
Creat: 0.91 mg/dL (ref 0.50–1.10)
Glucose, Bld: 76 mg/dL (ref 70–99)
PHOSPHORUS: 2.6 mg/dL (ref 2.3–4.6)
Potassium: 4.5 mEq/L (ref 3.5–5.3)
SODIUM: 140 meq/L (ref 135–145)

## 2013-08-10 MED ORDER — PROMETHAZINE HCL 25 MG PO TABS: 25.0000 mg | ORAL_TABLET | Freq: Three times a day (TID) | ORAL | Status: DC | PRN

## 2013-08-10 MED ORDER — HYDROCODONE-ACETAMINOPHEN 10-325 MG PO TABS: 1.0000 | ORAL_TABLET | Freq: Four times a day (QID) | ORAL | Status: DC | PRN

## 2013-08-10 MED ORDER — TRIAMCINOLONE ACETONIDE 0.1 % EX CREA: 1.0000 "application " | TOPICAL_CREAM | Freq: Two times a day (BID) | CUTANEOUS | Status: DC

## 2013-08-10 MED ORDER — ALPRAZOLAM 1 MG PO TABS: 1.0000 mg | ORAL_TABLET | Freq: Two times a day (BID) | ORAL | Status: DC | PRN

## 2013-08-10 MED ORDER — METOPROLOL TARTRATE 50 MG PO TABS: 50.0000 mg | ORAL_TABLET | Freq: Two times a day (BID) | ORAL | Status: DC

## 2013-08-10 NOTE — Progress Notes (Signed)
Pre visit review using our clinic review tool, if applicable. No additional management support is needed unless otherwise documented below in the visit note. 

## 2013-08-10 NOTE — Patient Instructions (Signed)
Eczema Eczema, also called atopic dermatitis, is a skin disorder that causes inflammation of the skin. It causes a red rash and dry, scaly skin. The skin becomes very itchy. Eczema is generally worse during the cooler winter months and often improves with the warmth of summer. Eczema usually starts showing signs in infancy. Some children outgrow eczema, but it may last through adulthood.  CAUSES  The exact cause of eczema is not known, but it appears to run in families. People with eczema often have a family history of eczema, allergies, asthma, or hay fever. Eczema is not contagious. Flare-ups of the condition may be caused by:   Contact with something you are sensitive or allergic to.   Stress. SIGNS AND SYMPTOMS  Dry, scaly skin.   Red, itchy rash.   Itchiness. This may occur before the skin rash and may be very intense.  DIAGNOSIS  The diagnosis of eczema is usually made based on symptoms and medical history. TREATMENT  Eczema cannot be cured, but symptoms usually can be controlled with treatment and other strategies. A treatment plan might include:  Controlling the itching and scratching.   Use over-the-counter antihistamines as directed for itching. This is especially useful at night when the itching tends to be worse.   Use over-the-counter steroid creams as directed for itching.   Avoid scratching. Scratching makes the rash and itching worse. It may also result in a skin infection (impetigo) due to a break in the skin caused by scratching.   Keeping the skin well moisturized with creams every day. This will seal in moisture and help prevent dryness. Lotions that contain alcohol and water should be avoided because they can dry the skin.   Limiting exposure to things that you are sensitive or allergic to (allergens).   Recognizing situations that cause stress.   Developing a plan to manage stress.  HOME CARE INSTRUCTIONS   Only take over-the-counter or  prescription medicines as directed by your health care provider.   Do not use anything on the skin without checking with your health care provider.   Keep baths or showers short (5 minutes) in warm (not hot) water. Use mild cleansers for bathing. These should be unscented. You may add nonperfumed bath oil to the bath water. It is best to avoid soap and bubble bath.   Immediately after a bath or shower, when the skin is still damp, apply a moisturizing ointment to the entire body. This ointment should be a petroleum ointment. This will seal in moisture and help prevent dryness. The thicker the ointment, the better. These should be unscented.   Keep fingernails cut short. Children with eczema may need to wear soft gloves or mittens at night after applying an ointment.   Dress in clothes made of cotton or cotton blends. Dress lightly, because heat increases itching.   A child with eczema should stay away from anyone with fever blisters or cold sores. The virus that causes fever blisters (herpes simplex) can cause a serious skin infection in children with eczema. SEEK MEDICAL CARE IF:   Your itching interferes with sleep.   Your rash gets worse or is not better within 1 week after starting treatment.   You see pus or soft yellow scabs in the rash area.   You have a fever.   You have a rash flare-up after contact with someone who has fever blisters.  Document Released: 05/24/2000 Document Revised: 03/17/2013 Document Reviewed: 12/28/2012 [facility] Patient Information 2014 [place].

## 2013-08-10 NOTE — Progress Notes (Signed)
Patient ID: Kathryn Randall, female   DOB: [birth year], 23 y.o.   MRN: [id number] DACY ENRICO [id number] 1990-08-24 08/10/2013      Progress Note-Follow Up  Subjective  Chief Complaint  Chief Complaint  Patient presents with  . Bronchitis    pt is here for f/u.    HPI  Caucasian female in today for followup. She did not take her Toprol today. She notes when she takes it she does not feel palpitations. She does feel palpitations when she forgets. No chest pain or shortness of breath. Is under a great deal of stress but is managing them despite being out of her medications. She reports an improvement in her urinary and respiratory symptoms. She has no complaints in this regard today. No GI or GU concerns at this time.  Past Medical History  Diagnosis Date  . Anxiety   . Depression   . Back pain   . Migraine 06/28/2011  . Allergic state 06/28/2011  . Preventative health care 06/28/2011  . Anxiety and depression 07/17/2011  . UTI (lower urinary tract infection) 08/18/2011  . Tachycardia 11/14/2011  . Anemia 11/14/2011  . Hypokalemia 11/14/2011  . Overweight 04/27/2012  . Dysmenorrhea 05/26/2012  . Neck strain 06/28/2012  . Insomnia 02/21/2013    Past Surgical History  Procedure Laterality Date  . Wisdom tooth extraction  23 yrs old    Family History  Problem Relation Age of Onset  . Migraines Mother   . Arthritis Mother   . Hearing loss Mother   . Menstrual problems Mother   . Migraines Father   . Obesity Maternal Grandmother   . Heart disease Maternal Grandmother     CHF  . Hypertension Maternal Grandmother   . Hearing loss Maternal Grandmother   . Arthritis Maternal Grandmother   . Menstrual problems Maternal Grandmother   . Diabetes Maternal Grandfather     type 2  . Obesity Maternal Grandfather   . Leukemia Paternal Grandfather   . Migraines Paternal Grandfather     History   Social History  . Marital Status: Single    Spouse Name: N/A    Number of  Children: N/A  . Years of Education: N/A   Occupational History  . Not on file.   Social History Main Topics  . Smoking status: Never Smoker   . Smokeless tobacco: Never Used     Comment: black and milds every once in awhile  . Alcohol Use: No  . Drug Use: 7.00 per week    Special: Hydrocodone  . Sexual Activity: Not Currently    Partners: Male   Other Topics Concern  . Not on file   Social History Narrative  . No narrative on file    Current Outpatient Prescriptions on File Prior to Visit  Medication Sig Dispense Refill  . albuterol (PROVENTIL HFA;VENTOLIN HFA) 108 (90 BASE) MCG/ACT inhaler Inhale 2 puffs into the lungs every 6 (six) hours as needed for wheezing or shortness of breath.  1 Inhaler  2  . ALPRAZolam (XANAX) 1 MG tablet Take 1 tablet (1 mg total) by mouth 2 (two) times daily as needed for anxiety.  60 tablet  1  . amitriptyline (ELAVIL) 25 MG tablet 2 tabs po qhs x 7 days then 3 tabs po qhs x 7 days then 4 tabs po qhs  120 tablet  1  . ciprofloxacin (CIPRO) 500 MG tablet Take 1 tablet (500 mg total) by mouth 2 (two) times daily. X 3 days  6 tablet  0  . Drospirenone-Ethinyl Estradiol-Levomefol (BEYAZ) 3-0.02-0.451 MG tablet Start Sunday after starting menses. 1 Active Tab po qd for 90 days, then 1 levomefolate tab po qd X 7days. Then repeat cycle.  3 Package  4  . HYDROcodone-acetaminophen (NORCO) 10-325 MG per tablet Take 1 tablet by mouth every 6 (six) hours as needed for moderate pain or severe pain.  120 tablet  0  . methocarbamol (ROBAXIN) 500 MG tablet Take 1 tablet (500 mg total) by mouth 3 (three) times daily as needed.  90 tablet  1  . phenazopyridine (PYRIDIUM) 200 MG tablet Take 1 tablet (200 mg total) by mouth 3 (three) times daily as needed for pain.  10 tablet  1  . promethazine (PHENERGAN) 25 MG tablet Take 1 tablet (25 mg total) by mouth every 8 (eight) hours as needed for nausea.  40 tablet  0  . QUEtiapine (SEROQUEL) 100 MG tablet 1 tab po qhs once  then 2 tabs po qhs x once then 3 tabs po qhs once then increase to 400 mg qhs  10 tablet  0  . QUEtiapine (SEROQUEL) 400 MG tablet Take 1 tablet (400 mg total) by mouth at bedtime.  30 tablet  5  . rizatriptan (MAXALT) 10 MG tablet Take 1 tablet (10 mg total) by mouth as needed for migraine. May repeat in 2 hours if needed  9 tablet  1  . venlafaxine XR (EFFEXOR-XR) 150 MG 24 hr capsule TAKE 1 CAPSULE BY MOUTH DAILY  30 capsule  3   No current facility-administered medications on file prior to visit.    Allergies  Allergen Reactions  . Ibuprofen     Racing heart  . Morphine And Related Itching    Review of Systems  Review of Systems  Constitutional: Negative for fever and malaise/fatigue.  HENT: Negative for congestion.   Eyes: Negative for discharge.  Respiratory: Negative for shortness of breath.   Cardiovascular: Positive for palpitations. Negative for chest pain and leg swelling.  Gastrointestinal: Negative for nausea, abdominal pain and diarrhea.  Genitourinary: Negative for dysuria.  Musculoskeletal: Negative for falls.  Skin: Negative for rash.  Neurological: Negative for loss of consciousness and headaches.  Endo/Heme/Allergies: Negative for polydipsia.  Psychiatric/Behavioral: Negative for depression and suicidal ideas. The patient is nervous/anxious. The patient does not have insomnia.     Objective  BP 110/66  Pulse 122  Temp(Src) 98.2 F (36.8 C) (Oral)  Ht 5\' 4"  (1.626 m)  Wt 207 lb (93.895 kg)  BMI 35.51 kg/m2  SpO2 98%  LMP 07/27/2013  Physical Exam  Physical Exam  Constitutional: She is oriented to person, place, and time and well-developed, well-nourished, and in no distress. No distress.  HENT:  Head: Normocephalic and atraumatic.  Eyes: Conjunctivae are normal.  Neck: Neck supple. No thyromegaly present.  Cardiovascular: Normal rate, regular rhythm and normal heart sounds.   No murmur heard. Pulmonary/Chest: Effort normal and breath sounds  normal. She has no wheezes.  Abdominal: She exhibits no distension and no mass.  Musculoskeletal: She exhibits no edema.  Lymphadenopathy:    She has no cervical adenopathy.  Neurological: She is alert and oriented to person, place, and time.  Skin: Skin is warm and dry. No rash noted. She is not diaphoretic.  Psychiatric: Memory, affect and judgment normal.    Lab Results  Component Value Date   TSH 2.392 04/02/2013   Lab Results  Component Value Date   WBC 6.0 04/02/2013   HGB  11.9* 04/02/2013   HCT 36.2 04/02/2013   MCV 85.6 04/02/2013   PLT 396 04/02/2013   Lab Results  Component Value Date   CREATININE 0.86 04/02/2013   BUN 12 04/02/2013   NA 137 04/02/2013   K 4.1 04/02/2013   CL 106 04/02/2013   CO2 27 04/02/2013   Lab Results  Component Value Date   ALT 9 04/02/2013   AST 15 04/02/2013   ALKPHOS 70 04/02/2013   BILITOT 0.2* 04/02/2013   Lab Results  Component Value Date   CHOL 163 07/17/2011   Lab Results  Component Value Date   HDL 81.90 07/17/2011   Lab Results  Component Value Date   LDLCALC 53 07/17/2011   Lab Results  Component Value Date   TRIG 140.0 07/17/2011   Lab Results  Component Value Date   CHOLHDL 2 07/17/2011     Assessment & Plan  Tachycardia Did not take her Metoprolol today. Agrees to take it prior to her next visit and to notify us if her pulse continues to run hi  Migraine Suffers with nausea during her migraines, given refills on meds  Anxiety and depression Struggling with her insurer, unable to get her meds recently will try again. Is under a great deal of stress but is managing  Back pain Pain is tolerable. Continue pain meds prn  Dysmenorrhea Tolerable with current meds

## 2013-08-15 NOTE — Assessment & Plan Note (Addendum)
Struggling with her insurer, unable to get her meds recently will try again. Is under a great deal of stress but is managing

## 2013-08-15 NOTE — Assessment & Plan Note (Signed)
Tolerable with current meds

## 2013-08-15 NOTE — Assessment & Plan Note (Signed)
Pain is tolerable. Continue pain meds prn

## 2013-08-15 NOTE — Assessment & Plan Note (Signed)
Did not take her Metoprolol today. Agrees to take it prior to her next visit and to notify us if her pulse continues to run hi

## 2013-08-15 NOTE — Assessment & Plan Note (Signed)
Suffers with nausea during her migraines, given refills on meds

## 2013-08-19 ENCOUNTER — Ambulatory Visit: Payer: BC Managed Care – PPO | Admitting: Family Medicine

## 2013-08-24 ENCOUNTER — Other Ambulatory Visit: Payer: Self-pay | Admitting: Family Medicine

## 2013-09-24 ENCOUNTER — Encounter: Payer: Self-pay | Admitting: Family Medicine

## 2013-09-24 ENCOUNTER — Telehealth: Payer: Self-pay | Admitting: Family Medicine

## 2013-09-24 ENCOUNTER — Ambulatory Visit (INDEPENDENT_AMBULATORY_CARE_PROVIDER_SITE_OTHER): Payer: BC Managed Care – PPO | Admitting: Family Medicine

## 2013-09-24 VITALS — BP 100/80 | HR 78 | Temp 98.5°F | Ht 64.0 in | Wt 204.1 lb

## 2013-09-24 DIAGNOSIS — M549 Dorsalgia, unspecified: Secondary | ICD-10-CM

## 2013-09-24 DIAGNOSIS — F329 Major depressive disorder, single episode, unspecified: Secondary | ICD-10-CM

## 2013-09-24 DIAGNOSIS — F411 Generalized anxiety disorder: Secondary | ICD-10-CM

## 2013-09-24 DIAGNOSIS — E663 Overweight: Secondary | ICD-10-CM

## 2013-09-24 DIAGNOSIS — R000 Tachycardia, unspecified: Secondary | ICD-10-CM

## 2013-09-24 DIAGNOSIS — F341 Dysthymic disorder: Secondary | ICD-10-CM

## 2013-09-24 DIAGNOSIS — Z0000 Encounter for general adult medical examination without abnormal findings: Secondary | ICD-10-CM

## 2013-09-24 DIAGNOSIS — R52 Pain, unspecified: Secondary | ICD-10-CM

## 2013-09-24 DIAGNOSIS — D649 Anemia, unspecified: Secondary | ICD-10-CM

## 2013-09-24 DIAGNOSIS — F419 Anxiety disorder, unspecified: Principal | ICD-10-CM

## 2013-09-24 MED ORDER — QUETIAPINE FUMARATE 300 MG PO TABS: 300.0000 mg | ORAL_TABLET | Freq: Every day | ORAL | Status: DC

## 2013-09-24 MED ORDER — HYDROCODONE-ACETAMINOPHEN 10-325 MG PO TABS: 1.0000 | ORAL_TABLET | Freq: Four times a day (QID) | ORAL | Status: DC | PRN

## 2013-09-24 MED ORDER — ALPRAZOLAM 1 MG PO TABS: 1.0000 mg | ORAL_TABLET | Freq: Two times a day (BID) | ORAL | Status: DC | PRN

## 2013-09-24 NOTE — Telephone Encounter (Signed)
Lab order placed.

## 2013-09-24 NOTE — Patient Instructions (Signed)
DASH Diet  The DASH diet stands for "Dietary Approaches to Stop Hypertension." It is a healthy eating plan that has been shown to reduce high blood pressure (hypertension) in as little as 14 days, while also possibly providing other significant health benefits. These other health benefits include reducing the risk of breast cancer after menopause and reducing the risk of type 2 diabetes, heart disease, colon cancer, and stroke. Health benefits also include weight loss and slowing kidney failure in patients with chronic kidney disease.   DIET GUIDELINES  · Limit salt (sodium). Your diet should contain less than 1500 mg of sodium daily.  · Limit refined or processed carbohydrates. Your diet should include mostly whole grains. Desserts and added sugars should be used sparingly.  · Include small amounts of heart-healthy fats. These types of fats include nuts, oils, and tub margarine. Limit saturated and trans fats. These fats have been shown to be harmful in the body.  CHOOSING FOODS   The following food groups are based on a 2000 calorie diet. See your Registered Dietitian for individual calorie needs.  Grains and Grain Products (6 to 8 servings daily)  · Eat More Often: Whole-wheat bread, brown rice, whole-grain or wheat pasta, quinoa, popcorn without added fat or salt (air popped).  · Eat Less Often: White bread, white pasta, white rice, cornbread.  Vegetables (4 to 5 servings daily)  · Eat More Often: Fresh, frozen, and canned vegetables. Vegetables may be raw, steamed, roasted, or grilled with a minimal amount of fat.  · Eat Less Often/Avoid: Creamed or fried vegetables. Vegetables in a cheese sauce.  Fruit (4 to 5 servings daily)  · Eat More Often: All fresh, canned (in natural juice), or frozen fruits. Dried fruits without added sugar. One hundred percent fruit juice (½ cup [237 mL] daily).  · Eat Less Often: Dried fruits with added sugar. Canned fruit in light or heavy syrup.  Lean Meats, Fish, and Poultry (2  servings or less daily. One serving is 3 to 4 oz [85-114 g]).  · Eat More Often: Ninety percent or leaner ground beef, tenderloin, sirloin. Round cuts of beef, chicken breast, turkey breast. All fish. Grill, bake, or broil your meat. Nothing should be fried.  · Eat Less Often/Avoid: Fatty cuts of meat, turkey, or chicken leg, thigh, or wing. Fried cuts of meat or fish.  Dairy (2 to 3 servings)  · Eat More Often: Low-fat or fat-free milk, low-fat plain or light yogurt, reduced-fat or part-skim cheese.  · Eat Less Often/Avoid: Milk (whole, 2%). Whole milk yogurt. Full-fat cheeses.  Nuts, Seeds, and Legumes (4 to 5 servings per week)  · Eat More Often: All without added salt.  · Eat Less Often/Avoid: Salted nuts and seeds, canned beans with added salt.  Fats and Sweets (limited)  · Eat More Often: Vegetable oils, tub margarines without trans fats, sugar-free gelatin. Mayonnaise and salad dressings.  · Eat Less Often/Avoid: Coconut oils, palm oils, butter, stick margarine, cream, half and half, cookies, candy, pie.  FOR MORE INFORMATION  The Dash Diet Eating Plan: www.dashdiet.org  Document Released: 05/16/2011 Document Revised: 08/19/2011 Document Reviewed: 05/16/2011  [employer]® Patient Information ©2014 [employer].

## 2013-09-24 NOTE — Telephone Encounter (Signed)
Return in about 4 months (around 01/24/2014) for lipid, renal, cbc, tsh, hepati, lipid, renal, cbc, tsh, hepatic, hgba1c prior, annual exam w/gyn.

## 2013-09-24 NOTE — Progress Notes (Signed)
Pre visit review using our clinic review tool, if applicable. No additional management support is needed unless otherwise documented below in the visit note. 

## 2013-09-26 ENCOUNTER — Encounter: Payer: Self-pay | Admitting: Family Medicine

## 2013-09-26 NOTE — Progress Notes (Signed)
Patient ID: Kathryn Randall, female   DOB: Feb 26, 1991, 23 y.o.   MRN: [id number] BRENLEY PRIORE [id number] Jun 18, 1990 09/26/2013      Progress Note-Follow Up  Subjective  Chief Complaint  Chief Complaint  Patient presents with  . Follow-up    6 week    HPI  Patient is a 23 year old female in today for routine medical care. She's generally doing well. He is happy to be back on Seroquel and does feel that helps her mood. She does note she's having a little more trouble sleeping however. Continues to have trouble with back and hip pain but finds it manageable with current medications. Denies CP/palp/SOB/HA/congestion/fevers/GI or GU c/o. Taking meds as prescribed  Past Medical History  Diagnosis Date  . Anxiety   . Depression   . Back pain   . Migraine 06/28/2011  . Allergic state 06/28/2011  . Preventative health care 06/28/2011  . Anxiety and depression 07/17/2011  . UTI (lower urinary tract infection) 08/18/2011  . Tachycardia 11/14/2011  . Anemia 11/14/2011  . Hypokalemia 11/14/2011  . Overweight 04/27/2012  . Dysmenorrhea 05/26/2012  . Neck strain 06/28/2012  . Insomnia 02/21/2013    Past Surgical History  Procedure Laterality Date  . Wisdom tooth extraction  23 yrs old    Family History  Problem Relation Age of Onset  . Migraines Mother   . Arthritis Mother   . Hearing loss Mother   . Menstrual problems Mother   . Migraines Father   . Obesity Maternal Grandmother   . Heart disease Maternal Grandmother     CHF  . Hypertension Maternal Grandmother   . Hearing loss Maternal Grandmother   . Arthritis Maternal Grandmother   . Menstrual problems Maternal Grandmother   . Diabetes Maternal Grandfather     type 2  . Obesity Maternal Grandfather   . Leukemia Paternal Grandfather   . Migraines Paternal Grandfather     History   Social History  . Marital Status: Single    Spouse Name: N/A    Number of Children: N/A  . Years of Education: N/A   Occupational History   . Not on file.   Social History Main Topics  . Smoking status: Never Smoker   . Smokeless tobacco: Never Used     Comment: black and milds every once in awhile  . Alcohol Use: No  . Drug Use: 7.00 per week    Special: Hydrocodone  . Sexual Activity: Not Currently    Partners: Male   Other Topics Concern  . Not on file   Social History Narrative  . No narrative on file    Current Outpatient Prescriptions on File Prior to Visit  Medication Sig Dispense Refill  . albuterol (PROVENTIL HFA;VENTOLIN HFA) 108 (90 BASE) MCG/ACT inhaler Inhale 2 puffs into the lungs every 6 (six) hours as needed for wheezing or shortness of breath.  1 Inhaler  2  . amitriptyline (ELAVIL) 25 MG tablet 2 tabs po qhs x 7 days then 3 tabs po qhs x 7 days then 4 tabs po qhs  120 tablet  1  . Drospirenone-Ethinyl Estradiol-Levomefol (BEYAZ) 3-0.02-0.451 MG tablet Start Sunday after starting menses. 1 Active Tab po qd for 90 days, then 1 levomefolate tab po qd X 7days. Then repeat cycle.  3 Package  4  . metoprolol (LOPRESSOR) 50 MG tablet Take 1 tablet (50 mg total) by mouth 2 (two) times daily.  60 tablet  5  . promethazine (PHENERGAN) 25  MG tablet Take 1 tablet (25 mg total) by mouth every 8 (eight) hours as needed for nausea.  40 tablet  0  . triamcinolone cream (KENALOG) 0.1 % Apply 1 application topically 2 (two) times daily.  85.2 g  3  . venlafaxine XR (EFFEXOR-XR) 150 MG 24 hr capsule TAKE 1 CAPSULE BY MOUTH DAILY  30 capsule  3  . rizatriptan (MAXALT) 10 MG tablet Take 1 tablet (10 mg total) by mouth as needed for migraine. May repeat in 2 hours if needed  9 tablet  1   No current facility-administered medications on file prior to visit.    Allergies  Allergen Reactions  . Ibuprofen     Racing heart  . Morphine And Related Itching    Review of Systems  Review of Systems  Constitutional: Negative for fever and malaise/fatigue.  HENT: Negative for congestion.   Eyes: Negative for discharge.   Respiratory: Negative for shortness of breath.   Cardiovascular: Negative for chest pain, palpitations and leg swelling.  Gastrointestinal: Negative for nausea, abdominal pain and diarrhea.  Genitourinary: Negative for dysuria.  Musculoskeletal: Positive for back pain and joint pain. Negative for falls.  Skin: Negative for rash.  Neurological: Negative for loss of consciousness and headaches.  Endo/Heme/Allergies: Negative for polydipsia.  Psychiatric/Behavioral: Negative for depression and suicidal ideas. The patient has insomnia. The patient is not nervous/anxious.     Objective  BP 100/80  Pulse 78  Temp(Src) 98.5 F (36.9 C) (Oral)  Ht 5\' 4"  (1.626 m)  Wt 204 lb 1.3 oz (92.57 kg)  BMI 35.01 kg/m2  SpO2 97%  LMP 09/15/2013  Physical Exam  Physical Exam  Constitutional: She is oriented to person, place, and time and well-developed, well-nourished, and in no distress. No distress.  HENT:  Head: Normocephalic and atraumatic.  Eyes: Conjunctivae are normal.  Neck: Neck supple. No thyromegaly present.  Cardiovascular: Normal rate, regular rhythm and normal heart sounds.   No murmur heard. Pulmonary/Chest: Effort normal and breath sounds normal. She has no wheezes.  Abdominal: She exhibits no distension and no mass.  Musculoskeletal: She exhibits no edema.  Lymphadenopathy:    She has no cervical adenopathy.  Neurological: She is alert and oriented to person, place, and time.  Skin: Skin is warm and dry. No rash noted. She is not diaphoretic.  Psychiatric: Memory, affect and judgment normal.    Lab Results  Component Value Date   TSH 0.641 08/10/2013   Lab Results  Component Value Date   WBC 5.3 08/10/2013   HGB 12.8 08/10/2013   HCT 38.3 08/10/2013   MCV 84.4 08/10/2013   PLT 343 08/10/2013   Lab Results  Component Value Date   CREATININE 0.91 08/10/2013   BUN 11 08/10/2013   NA 140 08/10/2013   K 4.5 08/10/2013   CL 105 08/10/2013   CO2 28 08/10/2013   Lab Results  Component  Value Date   ALT 9 04/02/2013   AST 15 04/02/2013   ALKPHOS 70 04/02/2013   BILITOT 0.2* 04/02/2013   Lab Results  Component Value Date   CHOL 163 07/17/2011   Lab Results  Component Value Date   HDL 81.90 07/17/2011   Lab Results  Component Value Date   LDLCALC 53 07/17/2011   Lab Results  Component Value Date   TRIG 140.0 07/17/2011   Lab Results  Component Value Date   CHOLHDL 2 07/17/2011     Assessment & Plan  Anxiety and depression Is better back  on the Seroquel but is having trouble sleeping. . Will decrease dosing.   Back pain Tolerable on current meds allowed a refill  Tachycardia Well treated on current meds  Anemia Increase leafy greens, consider increased lean red meat and using cast iron cookware. Continue to monitor, report any concerns  Overweight Encouraged DASH diet, decrease po intake and increase exercise as tolerated. Needs 7-8 hours of sleep nightly. Avoid trans fats, eat small, frequent meals every 4-5 hours with lean proteins, complex carbs and healthy fats. Minimize simple carbs, GMO foods.

## 2013-09-26 NOTE — Assessment & Plan Note (Signed)
Well treated on current meds

## 2013-09-26 NOTE — Assessment & Plan Note (Signed)
Tolerable on current meds allowed a refill

## 2013-09-26 NOTE — Assessment & Plan Note (Signed)
Increase leafy greens, consider increased lean red meat and using cast iron cookware. Continue to monitor, report any concerns 

## 2013-09-26 NOTE — Assessment & Plan Note (Signed)
Is better back on the Seroquel but is having trouble sleeping. . Will decrease dosing.

## 2013-09-26 NOTE — Assessment & Plan Note (Signed)
Encouraged DASH diet, decrease po intake and increase exercise as tolerated. Needs 7-8 hours of sleep nightly. Avoid trans fats, eat small, frequent meals every 4-5 hours with lean proteins, complex carbs and healthy fats. Minimize simple carbs, GMO foods. 

## 2013-10-04 ENCOUNTER — Telehealth: Payer: Self-pay

## 2013-10-04 DIAGNOSIS — N390 Urinary tract infection, site not specified: Secondary | ICD-10-CM

## 2013-10-04 MED ORDER — PHENAZOPYRIDINE HCL 200 MG PO TABS: 200.0000 mg | ORAL_TABLET | Freq: Three times a day (TID) | ORAL | Status: DC | PRN

## 2013-10-04 NOTE — Telephone Encounter (Signed)
So I would like a urine sample if possible but then can have some Bactrim DS 1 tab po bid x 5 days, disp #10, can have Pyridium 200 mg tid prn pain x 2 days, disp #6 warn her it turns urine orange

## 2013-10-04 NOTE — Telephone Encounter (Signed)
Pt left a message stating that it hurts really bad to go to the bathroom and she is going every 30 minutes. Patient has pain in her kidney area as well as her lower abdomen?  Please advise?

## 2013-10-04 NOTE — Telephone Encounter (Signed)
I informed pt. I will check with lab tomorrow and send in RX's.  Per md ok to send in pyridium

## 2013-10-07 NOTE — Telephone Encounter (Signed)
Closing since patient didn't come in

## 2013-10-15 ENCOUNTER — Ambulatory Visit (INDEPENDENT_AMBULATORY_CARE_PROVIDER_SITE_OTHER): Payer: BC Managed Care – PPO | Admitting: Family Medicine

## 2013-10-15 ENCOUNTER — Encounter: Payer: Self-pay | Admitting: Family Medicine

## 2013-10-15 VITALS — BP 104/64 | HR 77 | Temp 97.7°F | Ht 64.0 in | Wt 197.0 lb

## 2013-10-15 DIAGNOSIS — N920 Excessive and frequent menstruation with regular cycle: Secondary | ICD-10-CM

## 2013-10-15 DIAGNOSIS — M549 Dorsalgia, unspecified: Secondary | ICD-10-CM

## 2013-10-15 DIAGNOSIS — R000 Tachycardia, unspecified: Secondary | ICD-10-CM

## 2013-10-15 DIAGNOSIS — N946 Dysmenorrhea, unspecified: Secondary | ICD-10-CM

## 2013-10-15 LAB — CBC
HCT: 39.8 % (ref 36.0–46.0)
Hemoglobin: 13.5 g/dL (ref 12.0–15.0)
MCH: 28.5 pg (ref 26.0–34.0)
MCHC: 33.9 g/dL (ref 30.0–36.0)
MCV: 84 fL (ref 78.0–100.0)
Platelets: 467 10*3/uL — ABNORMAL HIGH (ref 150–400)
RBC: 4.74 MIL/uL (ref 3.87–5.11)
RDW: 14.2 % (ref 11.5–15.5)
WBC: 7.8 10*3/uL (ref 4.0–10.5)

## 2013-10-15 MED ORDER — OXYCODONE-ACETAMINOPHEN 10-325 MG PO TABS: 1.0000 | ORAL_TABLET | Freq: Three times a day (TID) | ORAL | Status: DC | PRN

## 2013-10-15 NOTE — Progress Notes (Signed)
Patient ID: Kathryn Randall, female   DOB: 03-06-1991, 23 y.o.   MRN: [id number] Kathryn Randall [id number] 01-Mar-1991 10/15/2013      Progress Note-Follow Up  Subjective  Chief Complaint  Chief Complaint  Patient presents with  . menstrual cycle    X 3 weeks- hasn't bleed today    HPI  Patient is a 23 year old female in today for routine medical care. In today complaining of 3 weeks worth of heavy menstrual bleeding. The last week has been more spotting first 2 weeks for heavy. She reports only missing one pill. She did not take any antibiotics. She's had significant increased cramping but this did respond some to pain meds. No fevers or chills. No vaginal discharge. Denies CP/palp/SOB/HA/congestion/fevers/GI or GU c/o. Taking meds as prescribed  Past Medical History  Diagnosis Date  . Anxiety   . Depression   . Back pain   . Migraine 06/28/2011  . Allergic state 06/28/2011  . Preventative health care 06/28/2011  . Anxiety and depression 07/17/2011  . UTI (lower urinary tract infection) 08/18/2011  . Tachycardia 11/14/2011  . Anemia 11/14/2011  . Hypokalemia 11/14/2011  . Overweight 04/27/2012  . Dysmenorrhea 05/26/2012  . Neck strain 06/28/2012  . Insomnia 02/21/2013    Past Surgical History  Procedure Laterality Date  . Wisdom tooth extraction  23 yrs old    Family History  Problem Relation Age of Onset  . Migraines Mother   . Arthritis Mother   . Hearing loss Mother   . Menstrual problems Mother   . Migraines Father   . Obesity Maternal Grandmother   . Heart disease Maternal Grandmother     CHF  . Hypertension Maternal Grandmother   . Hearing loss Maternal Grandmother   . Arthritis Maternal Grandmother   . Menstrual problems Maternal Grandmother   . Diabetes Maternal Grandfather     type 2  . Obesity Maternal Grandfather   . Leukemia Paternal Grandfather   . Migraines Paternal Grandfather     History   Social History  . Marital Status: Single    Spouse  Name: N/A    Number of Children: N/A  . Years of Education: N/A   Occupational History  . Not on file.   Social History Main Topics  . Smoking status: Never Smoker   . Smokeless tobacco: Never Used     Comment: black and milds every once in awhile  . Alcohol Use: No  . Drug Use: 7.00 per week    Special: Hydrocodone  . Sexual Activity: Not Currently    Partners: Male   Other Topics Concern  . Not on file   Social History Narrative  . No narrative on file    Current Outpatient Prescriptions on File Prior to Visit  Medication Sig Dispense Refill  . albuterol (PROVENTIL HFA;VENTOLIN HFA) 108 (90 BASE) MCG/ACT inhaler Inhale 2 puffs into the lungs every 6 (six) hours as needed for wheezing or shortness of breath.  1 Inhaler  2  . ALPRAZolam (XANAX) 1 MG tablet Take 1 tablet (1 mg total) by mouth 2 (two) times daily as needed for anxiety.  60 tablet  1  . amitriptyline (ELAVIL) 25 MG tablet 2 tabs po qhs x 7 days then 3 tabs po qhs x 7 days then 4 tabs po qhs  120 tablet  1  . Drospirenone-Ethinyl Estradiol-Levomefol (BEYAZ) 3-0.02-0.451 MG tablet Start Sunday after starting menses. 1 Active Tab po qd for 90 days, then 1 levomefolate  tab po qd X 7days. Then repeat cycle.  3 Package  4  . HYDROcodone-acetaminophen (NORCO) 10-325 MG per tablet Take 1 tablet by mouth every 6 (six) hours as needed for moderate pain or severe pain.  120 tablet  0  . metoprolol (LOPRESSOR) 50 MG tablet Take 1 tablet (50 mg total) by mouth 2 (two) times daily.  60 tablet  5  . promethazine (PHENERGAN) 25 MG tablet Take 1 tablet (25 mg total) by mouth every 8 (eight) hours as needed for nausea.  40 tablet  0  . QUEtiapine (SEROQUEL) 300 MG tablet Take 1 tablet (300 mg total) by mouth at bedtime.  30 tablet  3  . rizatriptan (MAXALT) 10 MG tablet Take 1 tablet (10 mg total) by mouth as needed for migraine. May repeat in 2 hours if needed  9 tablet  1  . triamcinolone cream (KENALOG) 0.1 % Apply 1 application  topically 2 (two) times daily.  85.2 g  3  . venlafaxine XR (EFFEXOR-XR) 150 MG 24 hr capsule TAKE 1 CAPSULE BY MOUTH DAILY  30 capsule  3   No current facility-administered medications on file prior to visit.    Allergies  Allergen Reactions  . Ibuprofen     Racing heart  . Morphine And Related Itching    Review of Systems  Review of Systems  Constitutional: Negative for fever and malaise/fatigue.  HENT: Negative for congestion.   Eyes: Negative for discharge.  Respiratory: Negative for shortness of breath.   Cardiovascular: Negative for chest pain, palpitations and leg swelling.  Gastrointestinal: Negative for nausea, abdominal pain and diarrhea.  Genitourinary: Negative for dysuria.  Musculoskeletal: Negative for falls.  Skin: Negative for rash.  Neurological: Negative for loss of consciousness and headaches.  Endo/Heme/Allergies: Negative for polydipsia.  Psychiatric/Behavioral: Negative for depression and suicidal ideas. The patient is not nervous/anxious and does not have insomnia.     Objective  BP 104/64  Pulse 77  Temp(Src) 97.7 F (36.5 C) (Oral)  Ht 5\' 4"  (1.626 m)  Wt 197 lb 0.6 oz (89.377 kg)  BMI 33.81 kg/m2  SpO2 97%  LMP 09/15/2013  Physical Exam  Physical Exam  Constitutional: She is oriented to person, place, and time and well-developed, well-nourished, and in no distress. No distress.  HENT:  Head: Normocephalic and atraumatic.  Eyes: Conjunctivae are normal.  Neck: Neck supple. No thyromegaly present.  Cardiovascular: Normal rate, regular rhythm and normal heart sounds.   No murmur heard. Pulmonary/Chest: Effort normal and breath sounds normal. She has no wheezes.  Abdominal: She exhibits no distension and no mass.  Musculoskeletal: She exhibits no edema.  Lymphadenopathy:    She has no cervical adenopathy.  Neurological: She is alert and oriented to person, place, and time.  Skin: Skin is warm and dry. No rash noted. She is not  diaphoretic.  Psychiatric: Memory, affect and judgment normal.    Lab Results  Component Value Date   TSH 0.641 08/10/2013   Lab Results  Component Value Date   WBC 5.3 08/10/2013   HGB 12.8 08/10/2013   HCT 38.3 08/10/2013   MCV 84.4 08/10/2013   PLT 343 08/10/2013   Lab Results  Component Value Date   CREATININE 0.91 08/10/2013   BUN 11 08/10/2013   NA 140 08/10/2013   K 4.5 08/10/2013   CL 105 08/10/2013   CO2 28 08/10/2013   Lab Results  Component Value Date   ALT 9 04/02/2013   AST 15 04/02/2013  ALKPHOS 70 04/02/2013   BILITOT 0.2* 04/02/2013   Lab Results  Component Value Date   CHOL 163 07/17/2011   Lab Results  Component Value Date   HDL 81.90 07/17/2011   Lab Results  Component Value Date   LDLCALC 53 07/17/2011   Lab Results  Component Value Date   TRIG 140.0 07/17/2011   Lab Results  Component Value Date   CHOLHDL 2 07/17/2011     Assessment & Plan  Dysmenorrhea Now with some persistent menses. Has dropped to spotting and pregnancy test is negative. CBC is normal. Offered new referral to GYN would like to wait for now. No changes unless symptoms are persistent. Allowed some Oxycodone for the increased cramping.   Tachycardia Well treated today

## 2013-10-15 NOTE — Patient Instructions (Signed)

## 2013-10-15 NOTE — Progress Notes (Signed)
Pre visit review using our clinic review tool, if applicable. No additional management support is needed unless otherwise documented below in the visit note. 

## 2013-10-16 LAB — HCG, QUANTITATIVE, PREGNANCY

## 2013-10-17 NOTE — Assessment & Plan Note (Signed)
Well treated today

## 2013-10-17 NOTE — Assessment & Plan Note (Signed)
Now with some persistent menses. Has dropped to spotting and pregnancy test is negative. CBC is normal. Offered new referral to GYN would like to wait for now. No changes unless symptoms are persistent. Allowed some Oxycodone for the increased cramping.

## 2013-10-20 ENCOUNTER — Telehealth: Payer: Self-pay | Admitting: Family Medicine

## 2013-10-20 DIAGNOSIS — J209 Acute bronchitis, unspecified: Secondary | ICD-10-CM

## 2013-10-20 NOTE — Telephone Encounter (Signed)
Patient states that she has now been on her monthly cycle for 3 weeks now and wanted to know what Dr. Charlett Blake recommend she do

## 2013-10-21 ENCOUNTER — Other Ambulatory Visit: Payer: Self-pay | Admitting: Family Medicine

## 2013-10-21 DIAGNOSIS — R109 Unspecified abdominal pain: Secondary | ICD-10-CM

## 2013-10-21 DIAGNOSIS — N920 Excessive and frequent menstruation with regular cycle: Secondary | ICD-10-CM

## 2013-10-21 MED ORDER — ALBUTEROL SULFATE HFA 108 (90 BASE) MCG/ACT IN AERS: 2.0000 | INHALATION_SPRAY | Freq: Four times a day (QID) | RESPIRATORY_TRACT | Status: DC | PRN

## 2013-10-21 NOTE — Telephone Encounter (Signed)
We should obtain a vaginal ultrasound and if that does not show anything and the bleeding does not stop will need to have a referral to gyn. I will order Ultrasound at [facility] if she agrees

## 2013-10-21 NOTE — Telephone Encounter (Signed)
Please advise? Pt states she was told to call back and inform md. Fills a pantyliner and messes in her underware (black blood).   Pt is aware that she will get a call back tomorrow due to provider being in meeting today. Pt voiced understanding Pt also would like a refill of Albuterol- I refilled this for pt

## 2013-10-21 NOTE — Telephone Encounter (Signed)
Pt informed and would like to proceed with [place].  Please order

## 2013-10-22 ENCOUNTER — Other Ambulatory Visit: Payer: Self-pay | Admitting: Family Medicine

## 2013-10-22 ENCOUNTER — Ambulatory Visit (HOSPITAL_BASED_OUTPATIENT_CLINIC_OR_DEPARTMENT_OTHER): Payer: BC Managed Care – PPO

## 2013-10-22 ENCOUNTER — Telehealth: Payer: Self-pay | Admitting: Internal Medicine

## 2013-10-22 DIAGNOSIS — R109 Unspecified abdominal pain: Secondary | ICD-10-CM

## 2013-10-22 DIAGNOSIS — N920 Excessive and frequent menstruation with regular cycle: Secondary | ICD-10-CM

## 2013-10-22 NOTE — Telephone Encounter (Signed)
Relevant patient education assigned to patient using Emmi. ° °

## 2013-10-24 ENCOUNTER — Other Ambulatory Visit: Payer: Self-pay | Admitting: Family

## 2013-10-26 ENCOUNTER — Ambulatory Visit (HOSPITAL_BASED_OUTPATIENT_CLINIC_OR_DEPARTMENT_OTHER)
Admission: RE | Admit: 2013-10-26 | Discharge: 2013-10-26 | Disposition: A | Discharge status: Home / Self Care | Payer: BC Managed Care – PPO | Source: Ambulatory Visit | Attending: Family Medicine | Admitting: Family Medicine

## 2013-10-26 DIAGNOSIS — R109 Unspecified abdominal pain: Secondary | ICD-10-CM

## 2013-10-26 DIAGNOSIS — N920 Excessive and frequent menstruation with regular cycle: Secondary | ICD-10-CM

## 2013-10-26 DIAGNOSIS — N898 Other specified noninflammatory disorders of vagina: Secondary | ICD-10-CM | POA: Insufficient documentation

## 2013-10-26 DIAGNOSIS — N946 Dysmenorrhea, unspecified: Secondary | ICD-10-CM | POA: Insufficient documentation

## 2013-11-10 ENCOUNTER — Telehealth: Payer: Self-pay | Admitting: *Deleted

## 2013-11-10 DIAGNOSIS — N920 Excessive and frequent menstruation with regular cycle: Secondary | ICD-10-CM

## 2013-11-10 DIAGNOSIS — M549 Dorsalgia, unspecified: Secondary | ICD-10-CM

## 2013-11-10 NOTE — Telephone Encounter (Signed)
She can have a refill on Percocet #15

## 2013-11-10 NOTE — Telephone Encounter (Signed)
Received call from pt stating she just started her menstrual cycle and is having a lot of pain. Pt is requesting refill of percocet. Last rx 10/15/13 #15.  Pt has appt scheduled on 11/19/13 for follow up.  Please advise.

## 2013-11-11 MED ORDER — OXYCODONE-ACETAMINOPHEN 10-325 MG PO TABS: 1.0000 | ORAL_TABLET | Freq: Three times a day (TID) | ORAL | Status: DC | PRN

## 2013-11-11 NOTE — Telephone Encounter (Signed)
RX printed and put at front desk.  I tried to call pt but it states the vm has not been set up yet

## 2013-11-19 ENCOUNTER — Ambulatory Visit (INDEPENDENT_AMBULATORY_CARE_PROVIDER_SITE_OTHER): Payer: BC Managed Care – PPO | Admitting: Family Medicine

## 2013-11-19 ENCOUNTER — Encounter: Payer: Self-pay | Admitting: Family Medicine

## 2013-11-19 ENCOUNTER — Other Ambulatory Visit (HOSPITAL_COMMUNITY)
Admission: RE | Admit: 2013-11-19 | Discharge: 2013-11-19 | Disposition: A | Discharge status: Home / Self Care | Payer: BC Managed Care – PPO | Source: Ambulatory Visit | Attending: Family Medicine | Admitting: Family Medicine

## 2013-11-19 VITALS — BP 129/84 | HR 105 | Temp 98.7°F | Resp 18 | Ht 64.0 in | Wt 195.0 lb

## 2013-11-19 DIAGNOSIS — F341 Dysthymic disorder: Secondary | ICD-10-CM

## 2013-11-19 DIAGNOSIS — F419 Anxiety disorder, unspecified: Secondary | ICD-10-CM

## 2013-11-19 DIAGNOSIS — F329 Major depressive disorder, single episode, unspecified: Secondary | ICD-10-CM

## 2013-11-19 DIAGNOSIS — M549 Dorsalgia, unspecified: Secondary | ICD-10-CM

## 2013-11-19 DIAGNOSIS — F411 Generalized anxiety disorder: Secondary | ICD-10-CM

## 2013-11-19 DIAGNOSIS — Z01419 Encounter for gynecological examination (general) (routine) without abnormal findings: Secondary | ICD-10-CM | POA: Insufficient documentation

## 2013-11-19 DIAGNOSIS — N920 Excessive and frequent menstruation with regular cycle: Secondary | ICD-10-CM

## 2013-11-19 DIAGNOSIS — Z124 Encounter for screening for malignant neoplasm of cervix: Secondary | ICD-10-CM

## 2013-11-19 DIAGNOSIS — R000 Tachycardia, unspecified: Secondary | ICD-10-CM

## 2013-11-19 MED ORDER — OXYCODONE-ACETAMINOPHEN 10-325 MG PO TABS: 1.0000 | ORAL_TABLET | Freq: Three times a day (TID) | ORAL | Status: DC | PRN

## 2013-11-19 MED ORDER — ALPRAZOLAM 1 MG PO TABS: 1.0000 mg | ORAL_TABLET | Freq: Two times a day (BID) | ORAL | Status: DC | PRN

## 2013-11-19 MED ORDER — QUETIAPINE FUMARATE 300 MG PO TABS: 300.0000 mg | ORAL_TABLET | Freq: Every day | ORAL | Status: DC

## 2013-11-19 NOTE — Patient Instructions (Signed)

## 2013-11-19 NOTE — Progress Notes (Signed)
Pre visit review using our clinic review tool, if applicable. No additional management support is needed unless otherwise documented below in the visit note. 

## 2013-11-24 LAB — CYTOLOGY - PAP

## 2013-11-28 ENCOUNTER — Encounter: Payer: Self-pay | Admitting: Family Medicine

## 2013-11-28 NOTE — Assessment & Plan Note (Signed)
Stable and manageable on current meds.

## 2013-11-28 NOTE — Assessment & Plan Note (Signed)
Improved on recheck did not take her Beta Blocker today. Encouraged to take it routinely

## 2013-11-28 NOTE — Assessment & Plan Note (Addendum)
Pap done today, no concerns on exam. Has been struggling with irregular, heavy, painful menses. May need referral to GYN if persists

## 2013-11-28 NOTE — Progress Notes (Signed)
Patient ID: Kathryn Randall, female   DOB: April 03, 1991, 23 y.o.   MRN: [id number] SHAREKA CASALE [id number] 09-Feb-1991 11/28/2013      Progress Note-Follow Up  Subjective  Chief Complaint  Chief Complaint  Patient presents with  . Annual Exam    HPI  Patient is a 23 year old female in today for routine medical care. He is in today for annual exam. Continues to struggle with chronic back pain but it is manageable. Her anxiety and depression are doing well on Seroquel. She's had no recent illness. She denies fevers or chills. Denies CP/palp/SOB/HA/congestion/fevers/GI or GU c/o. Taking meds as prescribed  Past Medical History  Diagnosis Date  . Anxiety   . Depression   . Back pain   . Migraine 06/28/2011  . Allergic state 06/28/2011  . Preventative health care 06/28/2011  . Anxiety and depression 07/17/2011  . UTI (lower urinary tract infection) 08/18/2011  . Tachycardia 11/14/2011  . Anemia 11/14/2011  . Hypokalemia 11/14/2011  . Overweight(278.02) 04/27/2012  . Dysmenorrhea 05/26/2012  . Neck strain 06/28/2012  . Insomnia 02/21/2013  . Cervical cancer screening 05/26/2012    Past Surgical History  Procedure Laterality Date  . Wisdom tooth extraction  23 yrs old    Family History  Problem Relation Age of Onset  . Migraines Mother   . Arthritis Mother   . Hearing loss Mother   . Menstrual problems Mother   . Migraines Father   . Obesity Maternal Grandmother   . Heart disease Maternal Grandmother     CHF  . Hypertension Maternal Grandmother   . Hearing loss Maternal Grandmother   . Arthritis Maternal Grandmother   . Menstrual problems Maternal Grandmother   . Diabetes Maternal Grandfather     type 2  . Obesity Maternal Grandfather   . Leukemia Paternal Grandfather   . Migraines Paternal Grandfather   . Cancer Paternal Grandfather     leukemia  . Ovarian cysts Paternal Aunt     History   Social History  . Marital Status: Single    Spouse Name: N/A    Number of  Children: N/A  . Years of Education: N/A   Occupational History  . Not on file.   Social History Main Topics  . Smoking status: Never Smoker   . Smokeless tobacco: Never Used     Comment: black and milds every once in awhile  . Alcohol Use: No  . Drug Use: 7.00 per week    Special: Hydrocodone  . Sexual Activity: Not Currently    Partners: Male   Other Topics Concern  . Not on file   Social History Narrative  . No narrative on file    Current Outpatient Prescriptions on File Prior to Visit  Medication Sig Dispense Refill  . albuterol (PROVENTIL HFA;VENTOLIN HFA) 108 (90 BASE) MCG/ACT inhaler Inhale 2 puffs into the lungs every 6 (six) hours as needed for wheezing or shortness of breath.  1 Inhaler  2  . Drospirenone-Ethinyl Estradiol-Levomefol (BEYAZ) 3-0.02-0.451 MG tablet Start Sunday after starting menses. 1 Active Tab po qd for 90 days, then 1 levomefolate tab po qd X 7days. Then repeat cycle.  3 Package  4  . metoprolol (LOPRESSOR) 50 MG tablet Take 1 tablet (50 mg total) by mouth 2 (two) times daily.  60 tablet  5  . promethazine (PHENERGAN) 25 MG tablet Take 1 tablet (25 mg total) by mouth every 8 (eight) hours as needed for nausea.  40 tablet  0  . rizatriptan (MAXALT) 10 MG tablet Take 1 tablet (10 mg total) by mouth as needed for migraine. May repeat in 2 hours if needed  9 tablet  1  . triamcinolone cream (KENALOG) 0.1 % Apply 1 application topically 2 (two) times daily.  85.2 g  3  . venlafaxine XR (EFFEXOR-XR) 150 MG 24 hr capsule TAKE ONE CAPSULE BY MOUTH DAILY  30 capsule  1   No current facility-administered medications on file prior to visit.    Allergies  Allergen Reactions  . Ibuprofen     Racing heart  . Morphine And Related Itching    Review of Systems  Review of Systems  Constitutional: Negative for fever, chills and malaise/fatigue.  HENT: Negative for congestion, hearing loss and nosebleeds.   Eyes: Negative for discharge.  Respiratory:  Negative for cough, sputum production, shortness of breath and wheezing.   Cardiovascular: Negative for chest pain, palpitations and leg swelling.  Gastrointestinal: Negative for heartburn, nausea, vomiting, abdominal pain, diarrhea, constipation and blood in stool.  Genitourinary: Negative for dysuria, urgency, frequency and hematuria.  Musculoskeletal: Negative for back pain, falls and myalgias.  Skin: Negative for rash.  Neurological: Negative for dizziness, tremors, sensory change, focal weakness, loss of consciousness, weakness and headaches.  Endo/Heme/Allergies: Negative for polydipsia. Does not bruise/bleed easily.  Psychiatric/Behavioral: Negative for depression and suicidal ideas. The patient is not nervous/anxious and does not have insomnia.     Objective  BP 129/84  Pulse 105  Temp(Src) 98.7 F (37.1 C) (Oral)  Resp 18  Ht 5\' 4"  (1.626 m)  Wt 195 lb (88.451 kg)  BMI 33.46 kg/m2  SpO2 99%  LMP 11/05/2013  Physical Exam  Physical Exam  Constitutional: She is oriented to person, place, and time and well-developed, well-nourished, and in no distress. No distress.  HENT:  Head: Normocephalic and atraumatic.  Right Ear: External ear normal.  Left Ear: External ear normal.  Nose: Nose normal.  Mouth/Throat: Oropharynx is clear and moist. No oropharyngeal exudate.  Eyes: Conjunctivae are normal. Pupils are equal, round, and reactive to light. Right eye exhibits no discharge. Left eye exhibits no discharge. No scleral icterus.  Neck: Normal range of motion. Neck supple. No thyromegaly present.  Cardiovascular: Normal rate, regular rhythm, normal heart sounds and intact distal pulses.   No murmur heard. Pulmonary/Chest: Effort normal and breath sounds normal. No respiratory distress. She has no wheezes. She has no rales.  Abdominal: Soft. Bowel sounds are normal. She exhibits no distension and no mass. There is no tenderness.  Genitourinary: Vagina normal, uterus normal,  cervix normal, right adnexa normal and left adnexa normal. No vaginal discharge found.  Musculoskeletal: Normal range of motion. She exhibits no edema and no tenderness.  Lymphadenopathy:    She has no cervical adenopathy.  Neurological: She is alert and oriented to person, place, and time. She has normal reflexes. No cranial nerve deficit. Coordination normal.  Skin: Skin is warm and dry. No rash noted. She is not diaphoretic.  Psychiatric: Mood, memory and affect normal.    Lab Results  Component Value Date   TSH 0.641 08/10/2013   Lab Results  Component Value Date   WBC 7.8 10/15/2013   HGB 13.5 10/15/2013   HCT 39.8 10/15/2013   MCV 84.0 10/15/2013   PLT 467* 10/15/2013   Lab Results  Component Value Date   CREATININE 0.91 08/10/2013   BUN 11 08/10/2013   NA 140 08/10/2013   K 4.5 08/10/2013   CL  105 08/10/2013   CO2 28 08/10/2013   Lab Results  Component Value Date   ALT 9 04/02/2013   AST 15 04/02/2013   ALKPHOS 70 04/02/2013   BILITOT 0.2* 04/02/2013   Lab Results  Component Value Date   CHOL 163 07/17/2011   Lab Results  Component Value Date   HDL 81.90 07/17/2011   Lab Results  Component Value Date   LDLCALC 53 07/17/2011   Lab Results  Component Value Date   TRIG 140.0 07/17/2011   Lab Results  Component Value Date   CHOLHDL 2 07/17/2011     Assessment & Plan  Tachycardia Improved on recheck did not take her Beta Blocker today. Encouraged to take it routinely  Anxiety and depression Good response to Seroquel. May continue the same  Back pain Stable and manageable on current meds.  Cervical cancer screening Pap done today, no concerns on exam. Has been struggling with irregular, heavy, painful menses. May need referral to GYN if persists

## 2013-11-28 NOTE — Assessment & Plan Note (Signed)
Good response to Seroquel. May continue the same

## 2013-12-19 ENCOUNTER — Other Ambulatory Visit: Payer: Self-pay | Admitting: Family Medicine

## 2014-01-01 ENCOUNTER — Other Ambulatory Visit: Payer: Self-pay | Admitting: Family Medicine

## 2014-01-17 ENCOUNTER — Other Ambulatory Visit: Payer: Self-pay

## 2014-01-17 DIAGNOSIS — N920 Excessive and frequent menstruation with regular cycle: Secondary | ICD-10-CM

## 2014-01-17 MED ORDER — OXYCODONE-ACETAMINOPHEN 10-325 MG PO TABS: 1.0000 | ORAL_TABLET | Freq: Three times a day (TID) | ORAL | Status: DC | PRN

## 2014-01-17 NOTE — Telephone Encounter (Signed)
Patient left a message stating she would like a refill on her percocet's?  Please advise?  Last RX was wrote on 11-19-13 quantity 90 with 0 refills

## 2014-01-18 NOTE — Telephone Encounter (Signed)
Patient informed and RX put in box to be taken to front desk

## 2014-02-02 ENCOUNTER — Other Ambulatory Visit: Payer: Self-pay | Admitting: Family Medicine

## 2014-02-02 NOTE — Telephone Encounter (Signed)
Rx sent to pharmacy. LDM 

## 2014-02-08 ENCOUNTER — Other Ambulatory Visit: Payer: Self-pay | Admitting: Family Medicine

## 2014-02-15 ENCOUNTER — Other Ambulatory Visit: Payer: Self-pay | Admitting: Family Medicine

## 2014-02-15 NOTE — Telephone Encounter (Signed)
RX printed for md to sign and fax  Last RX was done on 11-19-13 quantity 60 with 1 refill

## 2014-02-18 ENCOUNTER — Encounter: Payer: Self-pay | Admitting: Family Medicine

## 2014-02-18 ENCOUNTER — Ambulatory Visit (INDEPENDENT_AMBULATORY_CARE_PROVIDER_SITE_OTHER): Payer: BC Managed Care – PPO | Admitting: Family Medicine

## 2014-02-18 VITALS — BP 112/77 | HR 99 | Temp 98.6°F | Ht 64.0 in | Wt 189.8 lb

## 2014-02-18 DIAGNOSIS — F329 Major depressive disorder, single episode, unspecified: Secondary | ICD-10-CM

## 2014-02-18 DIAGNOSIS — F419 Anxiety disorder, unspecified: Secondary | ICD-10-CM

## 2014-02-18 DIAGNOSIS — D473 Essential (hemorrhagic) thrombocythemia: Secondary | ICD-10-CM

## 2014-02-18 DIAGNOSIS — D75839 Thrombocytosis, unspecified: Secondary | ICD-10-CM

## 2014-02-18 DIAGNOSIS — R52 Pain, unspecified: Secondary | ICD-10-CM

## 2014-02-18 DIAGNOSIS — Z23 Encounter for immunization: Secondary | ICD-10-CM

## 2014-02-18 DIAGNOSIS — R000 Tachycardia, unspecified: Secondary | ICD-10-CM

## 2014-02-18 DIAGNOSIS — N390 Urinary tract infection, site not specified: Secondary | ICD-10-CM

## 2014-02-18 DIAGNOSIS — F341 Dysthymic disorder: Secondary | ICD-10-CM

## 2014-02-18 DIAGNOSIS — F32A Depression, unspecified: Secondary | ICD-10-CM

## 2014-02-18 LAB — TSH: TSH: 0.679 u[IU]/mL (ref 0.350–4.500)

## 2014-02-18 MED ORDER — METOPROLOL TARTRATE 50 MG PO TABS: 50.0000 mg | ORAL_TABLET | Freq: Three times a day (TID) | ORAL | Status: DC

## 2014-02-18 MED ORDER — HYDROCODONE-ACETAMINOPHEN 7.5-325 MG PO TABS: 1.0000 | ORAL_TABLET | Freq: Three times a day (TID) | ORAL | Status: DC | PRN

## 2014-02-18 NOTE — Progress Notes (Signed)
Pre visit review using our clinic review tool, if applicable. No additional management support is needed unless otherwise documented below in the visit note. 

## 2014-02-19 LAB — CBC
HEMATOCRIT: 40.2 % (ref 36.0–46.0)
Hemoglobin: 13.2 g/dL (ref 12.0–15.0)
MCH: 29.1 pg (ref 26.0–34.0)
MCHC: 32.8 g/dL (ref 30.0–36.0)
MCV: 88.7 fL (ref 78.0–100.0)
Platelets: 327 10*3/uL (ref 150–400)
RBC: 4.53 MIL/uL (ref 3.87–5.11)
RDW: 13.4 % (ref 11.5–15.5)
WBC: 4.8 10*3/uL (ref 4.0–10.5)

## 2014-02-20 ENCOUNTER — Encounter: Payer: Self-pay | Admitting: Family Medicine

## 2014-02-20 NOTE — Assessment & Plan Note (Signed)
Doing well on Venlafaxine and Seroquel

## 2014-02-20 NOTE — Assessment & Plan Note (Signed)
No recent symptoms

## 2014-02-20 NOTE — Progress Notes (Signed)
Patient ID: Kathryn Randall, female   DOB: Dec 02, 1990, 23 y.o.   MRN: [id number] Kathryn Randall [id number] August 29, 1990 02/20/2014      Progress Note-Follow Up  Subjective  Chief Complaint  Chief Complaint  Patient presents with  . Follow-up    3 month  . Injections    flu    HPI  Patient is a 23 year old female in today for routine medical care. No recent illness. No urinary concerns. She does still continue with significant stress secondary to living with her grandmother but overall is doing well. Denies CP/palp/SOB/HA/congestion/fevers/GI or GU c/o. Taking meds as prescribed  Past Medical History  Diagnosis Date  . Anxiety   . Depression   . Back pain   . Migraine 06/28/2011  . Allergic state 06/28/2011  . Preventative health care 06/28/2011  . Anxiety and depression 07/17/2011  . UTI (lower urinary tract infection) 08/18/2011  . Tachycardia 11/14/2011  . Anemia 11/14/2011  . Hypokalemia 11/14/2011  . Overweight(278.02) 04/27/2012  . Dysmenorrhea 05/26/2012  . Neck strain 06/28/2012  . Insomnia 02/21/2013  . Cervical cancer screening 05/26/2012    Past Surgical History  Procedure Laterality Date  . Wisdom tooth extraction  23 yrs old    Family History  Problem Relation Age of Onset  . Migraines Mother   . Arthritis Mother   . Hearing loss Mother   . Menstrual problems Mother   . Migraines Father   . Obesity Maternal Grandmother   . Heart disease Maternal Grandmother     CHF  . Hypertension Maternal Grandmother   . Hearing loss Maternal Grandmother   . Arthritis Maternal Grandmother   . Menstrual problems Maternal Grandmother   . Diabetes Maternal Grandfather     type 2  . Obesity Maternal Grandfather   . Leukemia Paternal Grandfather   . Migraines Paternal Grandfather   . Cancer Paternal Grandfather     leukemia  . Ovarian cysts Paternal Aunt     History   Social History  . Marital Status: Single    Spouse Name: N/A    Number of Children: N/A  .  Years of Education: N/A   Occupational History  . Not on file.   Social History Main Topics  . Smoking status: Never Smoker   . Smokeless tobacco: Never Used     Comment: black and milds every once in awhile  . Alcohol Use: No  . Drug Use: 7.00 per week    Special: Hydrocodone  . Sexual Activity: Not Currently    Partners: Male   Other Topics Concern  . Not on file   Social History Narrative  . No narrative on file    Current Outpatient Prescriptions on File Prior to Visit  Medication Sig Dispense Refill  . albuterol (PROVENTIL HFA;VENTOLIN HFA) 108 (90 BASE) MCG/ACT inhaler Inhale 2 puffs into the lungs every 6 (six) hours as needed for wheezing or shortness of breath.  1 Inhaler  2  . ALPRAZolam (XANAX) 1 MG tablet TAKE 1 TABLET BY MOUTH TWICE DAILY AS NEEDED FOR ANXIETY  60 tablet  0  . BEYAZ 3-0.02-0.451 MG tablet TAKE AS INSTRUCTED BY YOUR PRESCRIBER  3 tablet  3  . promethazine (PHENERGAN) 25 MG tablet Take 1 tablet (25 mg total) by mouth every 8 (eight) hours as needed for nausea.  40 tablet  0  . QUEtiapine (SEROQUEL) 300 MG tablet Take 1 tablet (300 mg total) by mouth at bedtime.  30 tablet  3  .  rizatriptan (MAXALT) 10 MG tablet Take 1 tablet (10 mg total) by mouth as needed for migraine. May repeat in 2 hours if needed  9 tablet  1  . triamcinolone cream (KENALOG) 0.1 % Apply 1 application topically 2 (two) times daily.  85.2 g  3  . venlafaxine XR (EFFEXOR-XR) 150 MG 24 hr capsule TAKE ONE CAPSULE BY MOUTH DAILY  30 capsule  0   No current facility-administered medications on file prior to visit.    Allergies  Allergen Reactions  . Ibuprofen     Racing heart  . Morphine And Related Itching    Review of Systems  Review of Systems  Constitutional: Negative for fever, chills and malaise/fatigue.  HENT: Negative for congestion, hearing loss and nosebleeds.   Eyes: Negative for discharge.  Respiratory: Negative for cough, sputum production, shortness of breath  and wheezing.   Cardiovascular: Negative for chest pain, palpitations and leg swelling.  Gastrointestinal: Negative for heartburn, nausea, vomiting, abdominal pain, diarrhea, constipation and blood in stool.  Genitourinary: Negative for dysuria, urgency, frequency and hematuria.  Musculoskeletal: Negative for back pain, falls and myalgias.  Skin: Negative for rash.  Neurological: Negative for dizziness, tremors, sensory change, focal weakness, loss of consciousness, weakness and headaches.  Endo/Heme/Allergies: Negative for polydipsia. Does not bruise/bleed easily.  Psychiatric/Behavioral: Negative for depression and suicidal ideas. The patient is not nervous/anxious and does not have insomnia.     Objective  BP 112/77  Pulse 99  Temp(Src) 98.6 F (37 C) (Oral)  Ht 5\' 4"  (1.626 m)  Wt 189 lb 12.8 oz (86.093 kg)  BMI 32.56 kg/m2  SpO2 99%  LMP 01/04/2014  Physical Exam  Physical Exam  Constitutional: She is oriented to person, place, and time and well-developed, well-nourished, and in no distress. No distress.  HENT:  Head: Normocephalic and atraumatic.  Eyes: Conjunctivae are normal.  Neck: Neck supple. No thyromegaly present.  Cardiovascular: Normal rate, regular rhythm and normal heart sounds.   No murmur heard. Pulmonary/Chest: Effort normal and breath sounds normal. She has no wheezes.  Abdominal: She exhibits no distension and no mass.  Musculoskeletal: She exhibits no edema.  Lymphadenopathy:    She has no cervical adenopathy.  Neurological: She is alert and oriented to person, place, and time.  Skin: Skin is warm and dry. No rash noted. She is not diaphoretic.  Psychiatric: Memory, affect and judgment normal.    Lab Results  Component Value Date   TSH 0.679 02/18/2014   Lab Results  Component Value Date   WBC 4.8 02/18/2014   HGB 13.2 02/18/2014   HCT 40.2 02/18/2014   MCV 88.7 02/18/2014   PLT 327 02/18/2014   Lab Results  Component Value Date   CREATININE  0.91 08/10/2013   BUN 11 08/10/2013   NA 140 08/10/2013   K 4.5 08/10/2013   CL 105 08/10/2013   CO2 28 08/10/2013   Lab Results  Component Value Date   ALT 9 04/02/2013   AST 15 04/02/2013   ALKPHOS 70 04/02/2013   BILITOT 0.2* 04/02/2013   Lab Results  Component Value Date   CHOL 163 07/17/2011   Lab Results  Component Value Date   HDL 81.90 07/17/2011   Lab Results  Component Value Date   LDLCALC 53 07/17/2011   Lab Results  Component Value Date   TRIG 140.0 07/17/2011   Lab Results  Component Value Date   CHOLHDL 2 07/17/2011     Assessment & Plan  UTI (lower  urinary tract infection) No recent symptoms  Tachycardia Has been elevated lately will increase Metoprolol to tid  Anxiety and depression Doing well on Venlafaxine and Seroquel

## 2014-02-20 NOTE — Assessment & Plan Note (Signed)
Has been elevated lately will increase Metoprolol to tid

## 2014-03-06 ENCOUNTER — Other Ambulatory Visit: Payer: Self-pay | Admitting: Family Medicine

## 2014-03-14 ENCOUNTER — Other Ambulatory Visit: Payer: Self-pay | Admitting: Family Medicine

## 2014-03-18 ENCOUNTER — Other Ambulatory Visit: Payer: Self-pay | Admitting: Family Medicine

## 2014-03-18 NOTE — Telephone Encounter (Signed)
Last RX was done on 02-25-14 quantity 60 with 0 refills  rx printed for md to sign and fax

## 2014-03-21 ENCOUNTER — Telehealth: Payer: Self-pay | Admitting: Family Medicine

## 2014-03-21 DIAGNOSIS — R52 Pain, unspecified: Secondary | ICD-10-CM

## 2014-03-21 MED ORDER — HYDROCODONE-ACETAMINOPHEN 7.5-325 MG PO TABS: 1.0000 | ORAL_TABLET | Freq: Three times a day (TID) | ORAL | Status: DC | PRN

## 2014-03-21 NOTE — Telephone Encounter (Signed)
Last RX was done on 02-18-14 quantity 30 with 0 refills  RX printed for md to sign and pt informed that she can come after 3:45 pm to pick up RX

## 2014-03-21 NOTE — Telephone Encounter (Signed)
Caller name: [place] Relation to pt: self Call back number: [phone] Pharmacy:  Reason for call:   Patient is requesting a new hydrocodone rx

## 2014-04-05 ENCOUNTER — Other Ambulatory Visit: Payer: Self-pay | Admitting: Family Medicine

## 2014-04-07 ENCOUNTER — Encounter: Payer: Self-pay | Admitting: Family Medicine

## 2014-04-07 ENCOUNTER — Ambulatory Visit (INDEPENDENT_AMBULATORY_CARE_PROVIDER_SITE_OTHER): Payer: BC Managed Care – PPO | Admitting: Family Medicine

## 2014-04-07 VITALS — BP 96/65 | HR 79 | Temp 99.1°F | Ht 64.0 in | Wt 194.0 lb

## 2014-04-07 DIAGNOSIS — F411 Generalized anxiety disorder: Secondary | ICD-10-CM

## 2014-04-07 DIAGNOSIS — E663 Overweight: Secondary | ICD-10-CM

## 2014-04-07 DIAGNOSIS — E876 Hypokalemia: Secondary | ICD-10-CM

## 2014-04-07 DIAGNOSIS — R000 Tachycardia, unspecified: Secondary | ICD-10-CM

## 2014-04-07 DIAGNOSIS — D649 Anemia, unspecified: Secondary | ICD-10-CM

## 2014-04-07 DIAGNOSIS — E785 Hyperlipidemia, unspecified: Secondary | ICD-10-CM | POA: Insufficient documentation

## 2014-04-07 DIAGNOSIS — R52 Pain, unspecified: Secondary | ICD-10-CM

## 2014-04-07 DIAGNOSIS — M545 Low back pain: Secondary | ICD-10-CM

## 2014-04-07 LAB — CBC
HEMATOCRIT: 38.6 % (ref 36.0–46.0)
Hemoglobin: 12.6 g/dL (ref 12.0–15.0)
MCHC: 32.8 g/dL (ref 30.0–36.0)
MCV: 89.5 fl (ref 78.0–100.0)
PLATELETS: 371 10*3/uL (ref 150.0–400.0)
RBC: 4.31 Mil/uL (ref 3.87–5.11)
RDW: 13.7 % (ref 11.5–15.5)
WBC: 9.6 10*3/uL (ref 4.0–10.5)

## 2014-04-07 LAB — RENAL FUNCTION PANEL
Albumin: 3.4 g/dL — ABNORMAL LOW (ref 3.5–5.2)
BUN: 14 mg/dL (ref 6–23)
CHLORIDE: 105 meq/L (ref 96–112)
CO2: 26 mEq/L (ref 19–32)
Calcium: 9.4 mg/dL (ref 8.4–10.5)
Creatinine, Ser: 0.9 mg/dL (ref 0.4–1.2)
GFR: 80.38 mL/min (ref >60.00)
Glucose, Bld: 77 mg/dL (ref 70–99)
POTASSIUM: 3.9 meq/L (ref 3.5–5.1)
Phosphorus: 3.3 mg/dL (ref 2.3–4.6)
Sodium: 138 mEq/L (ref 135–145)

## 2014-04-07 LAB — HEPATIC FUNCTION PANEL
ALK PHOS: 71 U/L (ref 39–117)
ALT: 7 U/L (ref 0–35)
AST: 13 U/L (ref 0–37)
Albumin: 3.4 g/dL — ABNORMAL LOW (ref 3.5–5.2)
BILIRUBIN DIRECT: 0 mg/dL (ref 0.0–0.3)
Total Bilirubin: 0.5 mg/dL (ref 0.2–1.2)
Total Protein: 7.7 g/dL (ref 6.0–8.3)

## 2014-04-07 LAB — TSH: TSH: 2.07 u[IU]/mL (ref 0.35–4.50)

## 2014-04-07 MED ORDER — HYDROCODONE-ACETAMINOPHEN 7.5-325 MG PO TABS: 1.0000 | ORAL_TABLET | Freq: Three times a day (TID) | ORAL | Status: DC | PRN

## 2014-04-07 MED ORDER — DIAZEPAM 10 MG PO TABS: 10.0000 mg | ORAL_TABLET | Freq: Two times a day (BID) | ORAL | Status: DC | PRN

## 2014-04-07 NOTE — Progress Notes (Signed)
Pre visit review using our clinic review tool, if applicable. No additional management support is needed unless otherwise documented below in the visit note. 

## 2014-04-11 ENCOUNTER — Other Ambulatory Visit: Payer: Self-pay | Admitting: Family Medicine

## 2014-04-11 ENCOUNTER — Telehealth: Payer: Self-pay | Admitting: *Deleted

## 2014-04-11 NOTE — Telephone Encounter (Signed)
Can have a refill next week

## 2014-04-11 NOTE — Telephone Encounter (Signed)
rx refill- xanax 1 mg Last OV- 04/07/14 Last refilled- 03/18/14 #60 / 0 rf  UDS- none

## 2014-04-17 ENCOUNTER — Encounter: Payer: Self-pay | Admitting: Family Medicine

## 2014-04-17 NOTE — Assessment & Plan Note (Signed)
Encouraged moist heat and gentle stretching as tolerated. May try NSAIDs and prescription meds as directed and report if symptoms worsen or seek immediate care 

## 2014-04-17 NOTE — Assessment & Plan Note (Signed)
RRR today 

## 2014-04-17 NOTE — Assessment & Plan Note (Signed)
Encouraged DASH diet, decrease po intake and increase exercise as tolerated. Needs 7-8 hours of sleep nightly. Avoid trans fats, eat small, frequent meals every 4-5 hours with lean proteins, complex carbs and healthy fats. Minimize simple carbs, GMO foods. 

## 2014-04-17 NOTE — Progress Notes (Signed)
Patient ID: Kathryn Randall, female   DOB: January 30, 1991, 23 y.o.   MRN: [id number] Kathryn Randall [id number] [birth year] 04/17/2014      Progress Note-Follow Up  Subjective  Chief Complaint  Chief Complaint  Patient presents with  . Panic Attack    has to get teeth fixed and scared of going to the dentist    HPI  Patient is a 23 year old female in today for routine medical care. In today struggling with worsening stress. She is switching jobs and continues to struggle with stress in her family. She has had some anxiety and muscle spasm and headache as a result. Physically feels OK today. No recent acute illness. Denies CP/palp/SOB/HA/congestion/fevers/GI or GU c/o. Taking meds as prescribed  Past Medical History  Diagnosis Date  . Anxiety   . Depression   . Back pain   . Migraine 06/28/2011  . Allergic state 06/28/2011  . Preventative health care 06/28/2011  . Anxiety and depression 07/17/2011  . UTI (lower urinary tract infection) 08/18/2011  . Tachycardia 11/14/2011  . Anemia 11/14/2011  . Hypokalemia 11/14/2011  . Overweight(278.02) 04/27/2012  . Dysmenorrhea 05/26/2012  . Neck strain 06/28/2012  . Insomnia 02/21/2013  . Cervical cancer screening 05/26/2012    Past Surgical History  Procedure Laterality Date  . Wisdom tooth extraction  23 yrs old    Family History  Problem Relation Age of Onset  . Migraines Mother   . Arthritis Mother   . Hearing loss Mother   . Menstrual problems Mother   . Migraines Father   . Obesity Maternal Grandmother   . Heart disease Maternal Grandmother     CHF  . Hypertension Maternal Grandmother   . Hearing loss Maternal Grandmother   . Arthritis Maternal Grandmother   . Menstrual problems Maternal Grandmother   . Diabetes Maternal Grandfather     type 2  . Obesity Maternal Grandfather   . Leukemia Paternal Grandfather   . Migraines Paternal Grandfather   . Cancer Paternal Grandfather     leukemia  . Ovarian cysts Paternal Aunt      History   Social History  . Marital Status: Single    Spouse Name: N/A    Number of Children: N/A  . Years of Education: N/A   Occupational History  . Not on file.   Social History Main Topics  . Smoking status: Never Smoker   . Smokeless tobacco: Never Used     Comment: black and milds every once in awhile  . Alcohol Use: No  . Drug Use: 7.00 per week    Special: Hydrocodone  . Sexual Activity:    Partners: Male   Other Topics Concern  . Not on file   Social History Narrative    Current Outpatient Prescriptions on File Prior to Visit  Medication Sig Dispense Refill  . albuterol (PROVENTIL HFA;VENTOLIN HFA) 108 (90 BASE) MCG/ACT inhaler Inhale 2 puffs into the lungs every 6 (six) hours as needed for wheezing or shortness of breath. 1 Inhaler 2  . ALPRAZolam (XANAX) 1 MG tablet TAKE 1 TABLET BY MOUTH TWICE DAILY AS NEEDED FOR ANXIETY 60 tablet 0  . BEYAZ 3-0.02-0.451 MG tablet TAKE AS INSTRUCTED BY YOUR PRESCRIBER 3 tablet 3  . metoprolol (LOPRESSOR) 50 MG tablet Take 1 tablet (50 mg total) by mouth 3 (three) times daily. 90 tablet 2  . promethazine (PHENERGAN) 25 MG tablet Take 1 tablet (25 mg total) by mouth every 8 (eight) hours as needed for  nausea. 40 tablet 0  . QUEtiapine (SEROQUEL) 300 MG tablet TAKE 1 TABLET BY MOUTH AT BEDTIME 30 tablet 0  . rizatriptan (MAXALT) 10 MG tablet Take 1 tablet (10 mg total) by mouth as needed for migraine. May repeat in 2 hours if needed 9 tablet 1  . triamcinolone cream (KENALOG) 0.1 % Apply 1 application topically 2 (two) times daily. 85.2 g 3  . venlafaxine XR (EFFEXOR-XR) 150 MG 24 hr capsule TAKE 1 CAPSULE BY MOUTH DAILY 30 capsule 2   No current facility-administered medications on file prior to visit.    Allergies  Allergen Reactions  . Ibuprofen     Racing heart  . Morphine And Related Itching    Review of Systems  Review of Systems  Constitutional: Negative for fever and malaise/fatigue.  HENT: Positive for  congestion.   Eyes: Negative for discharge.  Respiratory: Negative for shortness of breath.   Cardiovascular: Negative for chest pain, palpitations and leg swelling.  Gastrointestinal: Negative for nausea, abdominal pain and diarrhea.  Genitourinary: Negative for dysuria.  Musculoskeletal: Positive for back pain and neck pain. Negative for falls.  Skin: Negative for rash.  Neurological: Positive for headaches. Negative for loss of consciousness.  Endo/Heme/Allergies: Negative for polydipsia.  Psychiatric/Behavioral: Positive for depression. Negative for suicidal ideas. The patient is nervous/anxious. The patient does not have insomnia.     Objective  BP 96/65 mmHg  Pulse 79  Temp(Src) 99.1 F (37.3 C) (Oral)  Ht 5\' 4"  (1.626 m)  Wt 194 lb (87.998 kg)  BMI 33.28 kg/m2  SpO2 99%  LMP 03/24/2014  Physical Exam  Physical Exam  Constitutional: She is oriented to person, place, and time and well-developed, well-nourished, and in no distress. No distress.  HENT:  Head: Normocephalic and atraumatic.  Eyes: Conjunctivae are normal.  Neck: Neck supple. No thyromegaly present.  Cardiovascular: Normal rate, regular rhythm and normal heart sounds.   No murmur heard. Pulmonary/Chest: Effort normal and breath sounds normal. She has no wheezes.  Abdominal: She exhibits no distension and no mass.  Musculoskeletal: She exhibits no edema.  Lymphadenopathy:    She has no cervical adenopathy.  Neurological: She is alert and oriented to person, place, and time.  Skin: Skin is warm and dry. No rash noted. She is not diaphoretic.  Psychiatric: Memory, affect and judgment normal.    Lab Results  Component Value Date   TSH 2.07 04/07/2014   Lab Results  Component Value Date   WBC 9.6 04/07/2014   HGB 12.6 04/07/2014   HCT 38.6 04/07/2014   MCV 89.5 04/07/2014   PLT 371.0 04/07/2014   Lab Results  Component Value Date   CREATININE 0.9 04/07/2014   BUN 14 04/07/2014   NA 138  04/07/2014   K 3.9 04/07/2014   CL 105 04/07/2014   CO2 26 04/07/2014   Lab Results  Component Value Date   ALT 7 04/07/2014   AST 13 04/07/2014   ALKPHOS 71 04/07/2014   BILITOT 0.5 04/07/2014   Lab Results  Component Value Date   CHOL 163 07/17/2011   Lab Results  Component Value Date   HDL 81.90 07/17/2011   Lab Results  Component Value Date   LDLCALC 53 07/17/2011   Lab Results  Component Value Date   TRIG 140.0 07/17/2011   Lab Results  Component Value Date   CHOLHDL 2 07/17/2011     Assessment & Plan   Overweight Encouraged DASH diet, decrease po intake and increase exercise as  tolerated. Needs 7-8 hours of sleep nightly. Avoid trans fats, eat small, frequent meals every 4-5 hours with lean proteins, complex carbs and healthy fats. Minimize simple carbs, GMO foods.  Tachycardia RRR today  Back pain Encouraged moist heat and gentle stretching as tolerated. May try NSAIDs and prescription meds as directed and report if symptoms worsen or seek immediate care

## 2014-04-22 ENCOUNTER — Other Ambulatory Visit: Payer: Self-pay | Admitting: Family Medicine

## 2014-04-22 NOTE — Telephone Encounter (Signed)
Last RX was done on 03-18-14 quantity 60 with 0 refills  rx printed for md to sign and fax

## 2014-04-26 ENCOUNTER — Telehealth: Payer: Self-pay | Admitting: Family Medicine

## 2014-04-26 NOTE — Telephone Encounter (Signed)
Caller name: Margan, Elias Relation to pt: self  Call back number: [phone]   Reason for call:  Pt has over 10 teeth broken pt states she is in pain and in need of pain medication.

## 2014-04-26 NOTE — Telephone Encounter (Signed)
I cannot give her any more without seeing her, I gave 60 just 2 weeks ago. She needs to see her dentist

## 2014-04-29 ENCOUNTER — Telehealth: Payer: Self-pay | Admitting: Family Medicine

## 2014-04-29 NOTE — Telephone Encounter (Signed)
Patient calling back on this. °

## 2014-04-29 NOTE — Telephone Encounter (Signed)
Caller name: [place] Relation to pt: self Call back number: [phone] Pharmacy:  Reason for call:   Patient states that she has two abcessed teeth and was given pain medication(hydrocodone) by her dentist and is concerned because she says that she can only get pain medication from Dr. Charlett Blake. Patient states that she does not want to break the controlled substance contract and wants to know what she should do. Patient will not fill this until she hears back from our office.

## 2014-04-29 NOTE — Telephone Encounter (Signed)
Spoke with patient and made her aware that she should be able to fill her prescription from her dentist at [facility] because the prescription was written to treat a different condition than what she is seeing Dr. Charlett Blake for.  Please see excerpt from contract below.  The same was reviewed with the patient.  Pt stated understanding and agreed to call us back if she has further questions or concerns.    "I will not request or accept controlled substances or controlled substance prescriptions from any other doctor or clinic while I am receiving controlled substance treatment at [facility]. The ONLY exception is if controlled substances are prescribed for the treatment of an acute condition that is NOT the diagnosis for which I am receiving treatment at [facility]. I will call my physician at [facility] if I receive controlled substance or controlled substance prescription from anywhere else."

## 2014-04-30 NOTE — Telephone Encounter (Signed)
perfect

## 2014-05-20 ENCOUNTER — Ambulatory Visit: Payer: BC Managed Care – PPO | Admitting: Family Medicine

## 2014-05-24 ENCOUNTER — Encounter: Payer: Self-pay | Admitting: Family Medicine

## 2014-05-24 ENCOUNTER — Ambulatory Visit (INDEPENDENT_AMBULATORY_CARE_PROVIDER_SITE_OTHER): Payer: BC Managed Care – PPO | Admitting: Family Medicine

## 2014-05-24 ENCOUNTER — Ambulatory Visit (HOSPITAL_BASED_OUTPATIENT_CLINIC_OR_DEPARTMENT_OTHER)
Admission: RE | Admit: 2014-05-24 | Discharge: 2014-05-24 | Disposition: A | Discharge status: Home / Self Care | Payer: BC Managed Care – PPO | Source: Ambulatory Visit | Attending: Family Medicine | Admitting: Family Medicine

## 2014-05-24 ENCOUNTER — Ambulatory Visit: Payer: BC Managed Care – PPO | Admitting: Family Medicine

## 2014-05-24 VITALS — BP 113/56 | HR 64 | Temp 98.8°F | Wt 187.8 lb

## 2014-05-24 DIAGNOSIS — M542 Cervicalgia: Secondary | ICD-10-CM | POA: Diagnosis present

## 2014-05-24 DIAGNOSIS — F411 Generalized anxiety disorder: Secondary | ICD-10-CM

## 2014-05-24 DIAGNOSIS — M545 Low back pain: Secondary | ICD-10-CM

## 2014-05-24 DIAGNOSIS — W19XXXA Unspecified fall, initial encounter: Secondary | ICD-10-CM | POA: Diagnosis not present

## 2014-05-24 DIAGNOSIS — F418 Other specified anxiety disorders: Secondary | ICD-10-CM

## 2014-05-24 DIAGNOSIS — D649 Anemia, unspecified: Secondary | ICD-10-CM

## 2014-05-24 DIAGNOSIS — E876 Hypokalemia: Secondary | ICD-10-CM

## 2014-05-24 DIAGNOSIS — R000 Tachycardia, unspecified: Secondary | ICD-10-CM

## 2014-05-24 DIAGNOSIS — F4323 Adjustment disorder with mixed anxiety and depressed mood: Secondary | ICD-10-CM

## 2014-05-24 DIAGNOSIS — E663 Overweight: Secondary | ICD-10-CM

## 2014-05-24 DIAGNOSIS — F329 Major depressive disorder, single episode, unspecified: Secondary | ICD-10-CM

## 2014-05-24 DIAGNOSIS — R221 Localized swelling, mass and lump, neck: Secondary | ICD-10-CM | POA: Insufficient documentation

## 2014-05-24 DIAGNOSIS — R52 Pain, unspecified: Secondary | ICD-10-CM

## 2014-05-24 DIAGNOSIS — R252 Cramp and spasm: Secondary | ICD-10-CM | POA: Insufficient documentation

## 2014-05-24 DIAGNOSIS — F419 Anxiety disorder, unspecified: Secondary | ICD-10-CM

## 2014-05-24 MED ORDER — CLONAZEPAM 1 MG PO TABS: 1.0000 mg | ORAL_TABLET | Freq: Two times a day (BID) | ORAL | Status: DC | PRN

## 2014-05-24 MED ORDER — VENLAFAXINE HCL ER 75 MG PO CP24: 75.0000 mg | ORAL_CAPSULE | Freq: Every day | ORAL | Status: DC

## 2014-05-24 MED ORDER — QUETIAPINE FUMARATE 400 MG PO TABS: 400.0000 mg | ORAL_TABLET | Freq: Every day | ORAL | Status: DC

## 2014-05-24 MED ORDER — HYDROCODONE-ACETAMINOPHEN 7.5-325 MG PO TABS: 1.0000 | ORAL_TABLET | Freq: Three times a day (TID) | ORAL | Status: DC | PRN

## 2014-05-24 NOTE — Progress Notes (Signed)
Kathryn Randall  [id number] 04-Oct-1990 05/24/2014      Progress Note-Follow Up  Subjective  Chief Complaint  Chief Complaint  Patient presents with  . Follow-up    3 mos    HPI  Patient is a 23 y.o. female in today for routine medical care. Patient is noting increased anxiety lately. No suidcidal ideaiton. Helps keep her from concentrating. Denies anhedonia at present. Denies CP/palp/SOB/HA/congestion/fevers/GI or GU c/o. Taking meds as prescribed  Past Medical History  Diagnosis Date  . Anxiety   . Depression   . Back pain   . Migraine 06/28/2011  . Allergic state 06/28/2011  . Preventative health care 06/28/2011  . Anxiety and depression 07/17/2011  . UTI (lower urinary tract infection) 08/18/2011  . Tachycardia 11/14/2011  . Anemia 11/14/2011  . Hypokalemia 11/14/2011  . Overweight(278.02) 04/27/2012  . Dysmenorrhea 05/26/2012  . Neck strain 06/28/2012  . Insomnia 02/21/2013  . Cervical cancer screening 05/26/2012    Past Surgical History  Procedure Laterality Date  . Wisdom tooth extraction  23 yrs old    Family History  Problem Relation Age of Onset  . Migraines Mother   . Arthritis Mother   . Hearing loss Mother   . Menstrual problems Mother   . Migraines Father   . Obesity Maternal Grandmother   . Heart disease Maternal Grandmother     CHF  . Hypertension Maternal Grandmother   . Hearing loss Maternal Grandmother   . Arthritis Maternal Grandmother   . Menstrual problems Maternal Grandmother   . Diabetes Maternal Grandfather     type 2  . Obesity Maternal Grandfather   . Leukemia Paternal Grandfather   . Migraines Paternal Grandfather   . Cancer Paternal Grandfather     leukemia  . Ovarian cysts Paternal Aunt     History   Social History  . Marital Status: Single    Spouse Name: N/A    Number of Children: N/A  . Years of Education: N/A   Occupational History  . Not on file.   Social History Main Topics  . Smoking status: Never Smoker   .  Smokeless tobacco: Never Used     Comment: black and milds every once in awhile  . Alcohol Use: No  . Drug Use: 7.00 per week    Special: Hydrocodone  . Sexual Activity:    Partners: Male   Other Topics Concern  . Not on file   Social History Narrative    Current Outpatient Prescriptions on File Prior to Visit  Medication Sig Dispense Refill  . albuterol (PROVENTIL HFA;VENTOLIN HFA) 108 (90 BASE) MCG/ACT inhaler Inhale 2 puffs into the lungs every 6 (six) hours as needed for wheezing or shortness of breath. 1 Inhaler 2  . ALPRAZolam (XANAX) 1 MG tablet TAKE 1 TABLET BY MOUTH TWICE DAILY AS NEEDED FOR ANXIETY 60 tablet 0  . BEYAZ 3-0.02-0.451 MG tablet TAKE AS INSTRUCTED BY YOUR PRESCRIBER 3 tablet 3  . diazepam (VALIUM) 10 MG tablet Take 1 tablet (10 mg total) by mouth every 12 (twelve) hours as needed for anxiety. And dental work 10 tablet 0  . HYDROcodone-acetaminophen (NORCO) 7.5-325 MG per tablet Take 1 tablet by mouth 3 (three) times daily as needed for moderate pain. 60 tablet 0  . metoprolol (LOPRESSOR) 50 MG tablet Take 1 tablet (50 mg total) by mouth 3 (three) times daily. 90 tablet 2  . promethazine (PHENERGAN) 25 MG tablet Take 1 tablet (25 mg total) by mouth every  8 (eight) hours as needed for nausea. 40 tablet 0  . QUEtiapine (SEROQUEL) 300 MG tablet TAKE 1 TABLET BY MOUTH AT BEDTIME 30 tablet 0  . rizatriptan (MAXALT) 10 MG tablet Take 1 tablet (10 mg total) by mouth as needed for migraine. May repeat in 2 hours if needed 9 tablet 1  . triamcinolone cream (KENALOG) 0.1 % Apply 1 application topically 2 (two) times daily. 85.2 g 3  . venlafaxine XR (EFFEXOR-XR) 150 MG 24 hr capsule TAKE 1 CAPSULE BY MOUTH DAILY 30 capsule 2   No current facility-administered medications on file prior to visit.    Allergies  Allergen Reactions  . Ibuprofen     Racing heart  . Morphine And Related Itching    Review of Systems  Review of Systems  Constitutional: Negative for fever  and malaise/fatigue.  HENT: Negative for congestion.   Eyes: Negative for discharge.  Respiratory: Negative for shortness of breath.   Cardiovascular: Negative for chest pain, palpitations and leg swelling.  Gastrointestinal: Negative for nausea, abdominal pain and diarrhea.  Genitourinary: Negative for dysuria.  Musculoskeletal: Negative for falls.  Skin: Negative for rash.  Neurological: Negative for loss of consciousness and headaches.  Endo/Heme/Allergies: Negative for polydipsia.  Psychiatric/Behavioral: Negative for depression and suicidal ideas. The patient is not nervous/anxious and does not have insomnia.     Objective  BP 113/56 mmHg  Pulse 64  Temp(Src) 98.8 F (37.1 C) (Oral)  Wt 187 lb 12.8 oz (85.186 kg)  SpO2 100%  LMP 05/17/2014  Physical Exam  Physical Exam  Constitutional: She is oriented to person, place, and time and well-developed, well-nourished, and in no distress. No distress.  HENT:  Head: Normocephalic and atraumatic.  Eyes: Conjunctivae are normal.  Neck: Neck supple. No thyromegaly present.  Cardiovascular: Normal rate, regular rhythm and normal heart sounds.   No murmur heard. Pulmonary/Chest: Effort normal and breath sounds normal. She has no wheezes.  Abdominal: She exhibits no distension and no mass.  Musculoskeletal: She exhibits no edema.  Lymphadenopathy:    She has no cervical adenopathy.  Neurological: She is alert and oriented to person, place, and time.  Skin: Skin is warm and dry. No rash noted. She is not diaphoretic.  Psychiatric: Memory, affect and judgment normal.    Lab Results  Component Value Date   TSH 2.07 04/07/2014   Lab Results  Component Value Date   WBC 9.6 04/07/2014   HGB 12.6 04/07/2014   HCT 38.6 04/07/2014   MCV 89.5 04/07/2014   PLT 371.0 04/07/2014   Lab Results  Component Value Date   CREATININE 0.9 04/07/2014   BUN 14 04/07/2014   NA 138 04/07/2014   K 3.9 04/07/2014   CL 105 04/07/2014   CO2  26 04/07/2014   Lab Results  Component Value Date   ALT 7 04/07/2014   AST 13 04/07/2014   ALKPHOS 71 04/07/2014   BILITOT 0.5 04/07/2014   Lab Results  Component Value Date   CHOL 163 07/17/2011   Lab Results  Component Value Date   HDL 81.90 07/17/2011   Lab Results  Component Value Date   LDLCALC 53 07/17/2011   Lab Results  Component Value Date   TRIG 140.0 07/17/2011   Lab Results  Component Value Date   CHOLHDL 2 07/17/2011     Assessment & Plan  Anxiety and depression Continues to struggle with family stressors but is managing on current meds. No changes at this time.  Overweight Encouraged  DASH diet, decrease po intake and increase exercise as tolerated. Needs 7-8 hours of sleep nightly. Avoid trans fats, eat small, frequent meals every 4-5 hours with lean proteins, complex carbs and healthy fats. Minimize simple carbs, GMO foods.  Tachycardia RRR  Back pain Encouraged moist heat and gentle stretching as tolerated. May try NSAIDs and prescription meds as directed and report if symptoms worsen or seek immediate care

## 2014-05-24 NOTE — Patient Instructions (Signed)
Back Pain, Adult Low back pain is very common. About 1 in 5 people have back pain.The cause of low back pain is rarely dangerous. The pain often gets better over time.About half of people with a sudden onset of back pain feel better in just 2 weeks. About 8 in 10 people feel better by 6 weeks.  CAUSES Some common causes of back pain include:  Strain of the muscles or ligaments supporting the spine.  Wear and tear (degeneration) of the spinal discs.  Arthritis.  Direct injury to the back. DIAGNOSIS Most of the time, the direct cause of low back pain is not known.However, back pain can be treated effectively even when the exact cause of the pain is unknown.Answering your caregiver's questions about your overall health and symptoms is one of the most accurate ways to make sure the cause of your pain is not dangerous. If your caregiver needs more information, he or she may order lab work or imaging tests (X-rays or MRIs).However, even if imaging tests show changes in your back, this usually does not require surgery. HOME CARE INSTRUCTIONS For many people, back pain returns.Since low back pain is rarely dangerous, it is often a condition that people can learn to manageon their own.   Remain active. It is stressful on the back to sit or stand in one place. Do not sit, drive, or stand in one place for more than 30 minutes at a time. Take short walks on level surfaces as soon as pain allows.Try to increase the length of time you walk each day.  Do not stay in bed.Resting more than 1 or 2 days can delay your recovery.  Do not avoid exercise or work.Your body is made to move.It is not dangerous to be active, even though your back may hurt.Your back will likely heal faster if you return to being active before your pain is gone.  Pay attention to your body when you bend and lift. Many people have less discomfortwhen lifting if they bend their knees, keep the load close to their bodies,and  avoid twisting. Often, the most comfortable positions are those that put less stress on your recovering back.  Find a comfortable position to sleep. Use a firm mattress and lie on your side with your knees slightly bent. If you lie on your back, put a pillow under your knees.  Only take over-the-counter or prescription medicines as directed by your caregiver. Over-the-counter medicines to reduce pain and inflammation are often the most helpful.Your caregiver may prescribe muscle relaxant drugs.These medicines help dull your pain so you can more quickly return to your normal activities and healthy exercise.  Put ice on the injured area.  Put ice in a plastic bag.  Place a towel between your skin and the bag.  Leave the ice on for 15-20 minutes, 03-04 times a day for the first 2 to 3 days. After that, ice and heat may be alternated to reduce pain and spasms.  Ask your caregiver about trying back exercises and gentle massage. This may be of some benefit.  Avoid feeling anxious or stressed.Stress increases muscle tension and can worsen back pain.It is important to recognize when you are anxious or stressed and learn ways to manage it.Exercise is a great option. SEEK MEDICAL CARE IF:  You have pain that is not relieved with rest or medicine.  You have pain that does not improve in 1 week.  You have new symptoms.  You are generally not feeling well. SEEK   IMMEDIATE MEDICAL CARE IF:   You have pain that radiates from your back into your legs.  You develop new bowel or bladder control problems.  You have unusual weakness or numbness in your arms or legs.  You develop nausea or vomiting.  You develop abdominal pain.  You feel faint. Document Released: 05/27/2005 Document Revised: 11/26/2011 Document Reviewed: 09/28/2013 ExitCare Patient Information 2015 [employer]. This information is not intended to replace advice given to you by your health care provider. Make sure you  discuss any questions you have with your health care provider.  

## 2014-05-24 NOTE — Progress Notes (Signed)
Pre visit review using our clinic review tool, if applicable. No additional management support is needed unless otherwise documented below in the visit note. 

## 2014-05-27 ENCOUNTER — Telehealth: Payer: Self-pay

## 2014-05-27 MED ORDER — BACLOFEN 10 MG PO TABS: 10.0000 mg | ORAL_TABLET | Freq: Two times a day (BID) | ORAL | Status: DC

## 2014-05-27 NOTE — Telephone Encounter (Signed)
Dr. Frederik Pear recommendation discussed with patient.  Pt agreed.  Rx sent to [facility] on [address] as requested by patient.

## 2014-05-27 NOTE — Telephone Encounter (Signed)
Left a message for call back.  

## 2014-05-27 NOTE — Telephone Encounter (Signed)
-----   Message from Mosie Lukes, MD sent at 05/25/2014  7:17 PM EST ----- Notify xray has no acute concerns just some straigthening related to spasm

## 2014-05-27 NOTE — Telephone Encounter (Signed)
Notes Recorded by Mosie Lukes, MD on 05/26/2014 at 6:46 PM OK to let her try Baclofen 10 mg 1 tab po bid prn pain, disp #30, no rf ------  Notes Recorded by Rudene Anda, RN on 05/26/2014 at 5:04 PM Pt notified and made aware. Pt states she continues to experience muscle spasms and pain medication (hydrocodone) is not working. She is requesting a muscle relaxer at this point. Please advise.

## 2014-05-29 NOTE — Assessment & Plan Note (Signed)
Encouraged DASH diet, decrease po intake and increase exercise as tolerated. Needs 7-8 hours of sleep nightly. Avoid trans fats, eat small, frequent meals every 4-5 hours with lean proteins, complex carbs and healthy fats. Minimize simple carbs, GMO foods. 

## 2014-05-29 NOTE — Assessment & Plan Note (Signed)
RRR 

## 2014-05-29 NOTE — Assessment & Plan Note (Signed)
Continues to struggle with family stressors but is managing on current meds. No changes at this time.

## 2014-05-29 NOTE — Assessment & Plan Note (Signed)
Encouraged moist heat and gentle stretching as tolerated. May try NSAIDs and prescription meds as directed and report if symptoms worsen or seek immediate care 

## 2014-06-09 ENCOUNTER — Ambulatory Visit: Payer: BC Managed Care – PPO | Admitting: Family Medicine

## 2014-06-12 ENCOUNTER — Other Ambulatory Visit: Payer: Self-pay | Admitting: Family Medicine

## 2014-06-13 NOTE — Telephone Encounter (Signed)
Metoprolol refilled per protocol. JG//CMA 

## 2014-07-05 ENCOUNTER — Ambulatory Visit (INDEPENDENT_AMBULATORY_CARE_PROVIDER_SITE_OTHER): Payer: BLUE CROSS/BLUE SHIELD | Admitting: Family Medicine

## 2014-07-05 ENCOUNTER — Encounter: Payer: Self-pay | Admitting: Family Medicine

## 2014-07-05 VITALS — BP 108/67 | HR 74 | Temp 98.0°F | Ht 64.0 in | Wt 183.8 lb

## 2014-07-05 DIAGNOSIS — D649 Anemia, unspecified: Secondary | ICD-10-CM

## 2014-07-05 DIAGNOSIS — F419 Anxiety disorder, unspecified: Secondary | ICD-10-CM

## 2014-07-05 DIAGNOSIS — F411 Generalized anxiety disorder: Secondary | ICD-10-CM

## 2014-07-05 DIAGNOSIS — R000 Tachycardia, unspecified: Secondary | ICD-10-CM

## 2014-07-05 DIAGNOSIS — M62838 Other muscle spasm: Secondary | ICD-10-CM

## 2014-07-05 DIAGNOSIS — M6248 Contracture of muscle, other site: Secondary | ICD-10-CM

## 2014-07-05 DIAGNOSIS — Z23 Encounter for immunization: Secondary | ICD-10-CM

## 2014-07-05 DIAGNOSIS — F418 Other specified anxiety disorders: Secondary | ICD-10-CM

## 2014-07-05 DIAGNOSIS — R52 Pain, unspecified: Secondary | ICD-10-CM

## 2014-07-05 DIAGNOSIS — F329 Major depressive disorder, single episode, unspecified: Secondary | ICD-10-CM

## 2014-07-05 DIAGNOSIS — F32A Depression, unspecified: Secondary | ICD-10-CM

## 2014-07-05 DIAGNOSIS — M545 Low back pain: Secondary | ICD-10-CM

## 2014-07-05 DIAGNOSIS — J209 Acute bronchitis, unspecified: Secondary | ICD-10-CM

## 2014-07-05 DIAGNOSIS — E876 Hypokalemia: Secondary | ICD-10-CM

## 2014-07-05 MED ORDER — METHOCARBAMOL 500 MG PO TABS: 500.0000 mg | ORAL_TABLET | Freq: Three times a day (TID) | ORAL | Status: DC | PRN

## 2014-07-05 MED ORDER — DIAZEPAM 10 MG PO TABS
10.0000 mg | ORAL_TABLET | Freq: Two times a day (BID) | ORAL | Status: DC | PRN
Start: 2014-07-05 — End: 2014-08-05

## 2014-07-05 MED ORDER — HYDROCODONE-ACETAMINOPHEN 7.5-325 MG PO TABS: 1.0000 | ORAL_TABLET | Freq: Three times a day (TID) | ORAL | Status: DC | PRN

## 2014-07-05 MED ORDER — QUETIAPINE FUMARATE 300 MG PO TABS: 300.0000 mg | ORAL_TABLET | Freq: Every day | ORAL | Status: DC

## 2014-07-05 NOTE — Progress Notes (Signed)
Pre visit review using our clinic review tool, if applicable. No additional management support is needed unless otherwise documented below in the visit note. 

## 2014-07-05 NOTE — Patient Instructions (Signed)
Back Pain, Adult Low back pain is very common. About 1 in 5 people have back pain.The cause of low back pain is rarely dangerous. The pain often gets better over time.About half of people with a sudden onset of back pain feel better in just 2 weeks. About 8 in 10 people feel better by 6 weeks.  CAUSES Some common causes of back pain include:  Strain of the muscles or ligaments supporting the spine.  Wear and tear (degeneration) of the spinal discs.  Arthritis.  Direct injury to the back. DIAGNOSIS Most of the time, the direct cause of low back pain is not known.However, back pain can be treated effectively even when the exact cause of the pain is unknown.Answering your caregiver's questions about your overall health and symptoms is one of the most accurate ways to make sure the cause of your pain is not dangerous. If your caregiver needs more information, he or she may order lab work or imaging tests (X-rays or MRIs).However, even if imaging tests show changes in your back, this usually does not require surgery. HOME CARE INSTRUCTIONS For many people, back pain returns.Since low back pain is rarely dangerous, it is often a condition that people can learn to manageon their own.   Remain active. It is stressful on the back to sit or stand in one place. Do not sit, drive, or stand in one place for more than 30 minutes at a time. Take short walks on level surfaces as soon as pain allows.Try to increase the length of time you walk each day.  Do not stay in bed.Resting more than 1 or 2 days can delay your recovery.  Do not avoid exercise or work.Your body is made to move.It is not dangerous to be active, even though your back may hurt.Your back will likely heal faster if you return to being active before your pain is gone.  Pay attention to your body when you bend and lift. Many people have less discomfortwhen lifting if they bend their knees, keep the load close to their bodies,and  avoid twisting. Often, the most comfortable positions are those that put less stress on your recovering back.  Find a comfortable position to sleep. Use a firm mattress and lie on your side with your knees slightly bent. If you lie on your back, put a pillow under your knees.  Only take over-the-counter or prescription medicines as directed by your caregiver. Over-the-counter medicines to reduce pain and inflammation are often the most helpful.Your caregiver may prescribe muscle relaxant drugs.These medicines help dull your pain so you can more quickly return to your normal activities and healthy exercise.  Put ice on the injured area.  Put ice in a plastic bag.  Place a towel between your skin and the bag.  Leave the ice on for 15-20 minutes, 03-04 times a day for the first 2 to 3 days. After that, ice and heat may be alternated to reduce pain and spasms.  Ask your caregiver about trying back exercises and gentle massage. This may be of some benefit.  Avoid feeling anxious or stressed.Stress increases muscle tension and can worsen back pain.It is important to recognize when you are anxious or stressed and learn ways to manage it.Exercise is a great option. SEEK MEDICAL CARE IF:  You have pain that is not relieved with rest or medicine.  You have pain that does not improve in 1 week.  You have new symptoms.  You are generally not feeling well. SEEK   IMMEDIATE MEDICAL CARE IF:   You have pain that radiates from your back into your legs.  You develop new bowel or bladder control problems.  You have unusual weakness or numbness in your arms or legs.  You develop nausea or vomiting.  You develop abdominal pain.  You feel faint. Document Released: 05/27/2005 Document Revised: 11/26/2011 Document Reviewed: 09/28/2013 ExitCare Patient Information 2015 [employer]. This information is not intended to replace advice given to you by your health care provider. Make sure you  discuss any questions you have with your health care provider.  

## 2014-07-10 NOTE — Assessment & Plan Note (Signed)
Doing well, no recent flares. Struggling with stress secondary to her mother having complications during a recent surgery. She is doing better now.

## 2014-07-10 NOTE — Assessment & Plan Note (Signed)
Resolved

## 2014-07-10 NOTE — Assessment & Plan Note (Signed)
Encouraged to consider chiropractic care and exercise as tolerated. May continue meds prn

## 2014-07-10 NOTE — Progress Notes (Signed)
Patient ID: Kathryn Randall, female   DOB: 11/05/1990, 24 y.o.   MRN: [id number]   CAMBRI PLOURDE  [id number] 21-Apr-1991 07/10/2014      Progress Note-Follow Up  Subjective  Chief Complaint  Chief Complaint  Patient presents with  . Follow-up    6 weeks    HPI  Patient is a 24 y.o. female in today for routine medical care. Her breathing has gotten better. No significant cough. No fevers, acute illness otherwise. She continues to struggle with back and neck pain but no acute injury. Is enjoying her new job and feels it will be less stress once she gets settled in. Her mother recently had surgery with complications but is doing better now. Denies CP/palp/SOB/HA/congestion/fevers/GI or GU c/o. Taking meds as prescribed  Past Medical History  Diagnosis Date  . Anxiety   . Depression   . Back pain   . Migraine 06/28/2011  . Allergic state 06/28/2011  . Preventative health care 06/28/2011  . Anxiety and depression 07/17/2011  . UTI (lower urinary tract infection) 08/18/2011  . Tachycardia 11/14/2011  . Anemia 11/14/2011  . Hypokalemia 11/14/2011  . Overweight(278.02) 04/27/2012  . Dysmenorrhea 05/26/2012  . Neck strain 06/28/2012  . Insomnia 02/21/2013  . Cervical cancer screening 05/26/2012    Past Surgical History  Procedure Laterality Date  . Wisdom tooth extraction  24 yrs old    Family History  Problem Relation Age of Onset  . Migraines Mother   . Arthritis Mother   . Hearing loss Mother   . Menstrual problems Mother   . Migraines Father   . Obesity Maternal Grandmother   . Heart disease Maternal Grandmother     CHF  . Hypertension Maternal Grandmother   . Hearing loss Maternal Grandmother   . Arthritis Maternal Grandmother   . Menstrual problems Maternal Grandmother   . Diabetes Maternal Grandfather     type 2  . Obesity Maternal Grandfather   . Leukemia Paternal Grandfather   . Migraines Paternal Grandfather   . Cancer Paternal Grandfather     leukemia  .  Ovarian cysts Paternal Aunt     History   Social History  . Marital Status: Single    Spouse Name: N/A    Number of Children: N/A  . Years of Education: N/A   Occupational History  . Not on file.   Social History Main Topics  . Smoking status: Never Smoker   . Smokeless tobacco: Never Used     Comment: black and milds every once in awhile  . Alcohol Use: No  . Drug Use: 7.00 per week    Special: Hydrocodone  . Sexual Activity:    Partners: Male   Other Topics Concern  . Not on file   Social History Narrative    Current Outpatient Prescriptions on File Prior to Visit  Medication Sig Dispense Refill  . albuterol (PROVENTIL HFA;VENTOLIN HFA) 108 (90 BASE) MCG/ACT inhaler Inhale 2 puffs into the lungs every 6 (six) hours as needed for wheezing or shortness of breath. 1 Inhaler 2  . BEYAZ 3-0.02-0.451 MG tablet TAKE AS INSTRUCTED BY YOUR PRESCRIBER 3 tablet 3  . metoprolol (LOPRESSOR) 50 MG tablet TAKE 1 TABLET BY MOUTH THREE TIMES DAILY 90 tablet 5  . triamcinolone cream (KENALOG) 0.1 % Apply 1 application topically 2 (two) times daily. 85.2 g 3  . venlafaxine XR (EFFEXOR XR) 75 MG 24 hr capsule Take 1 capsule (75 mg total) by mouth daily with breakfast. 30  capsule 3   No current facility-administered medications on file prior to visit.    Allergies  Allergen Reactions  . Ibuprofen     Racing heart  . Morphine And Related Itching    Review of Systems  Review of Systems  Constitutional: Negative for fever and malaise/fatigue.  HENT: Negative for congestion.   Eyes: Negative for discharge.  Respiratory: Negative for shortness of breath.   Cardiovascular: Negative for chest pain, palpitations and leg swelling.  Gastrointestinal: Negative for nausea, abdominal pain and diarrhea.  Genitourinary: Negative for dysuria.  Musculoskeletal: Positive for back pain. Negative for falls.  Skin: Negative for rash.  Neurological: Negative for loss of consciousness and  headaches.  Endo/Heme/Allergies: Negative for polydipsia.  Psychiatric/Behavioral: Negative for depression and suicidal ideas. The patient is nervous/anxious. The patient does not have insomnia.     Objective  BP 108/67 mmHg  Pulse 74  Temp(Src) 98 F (36.7 C) (Oral)  Ht 5\' 4"  (1.626 m)  Wt 183 lb 12.8 oz (83.371 kg)  BMI 31.53 kg/m2  SpO2 99%  LMP 06/14/2014  Physical Exam  Physical Exam  Constitutional: She is oriented to person, place, and time and well-developed, well-nourished, and in no distress. No distress.  HENT:  Head: Normocephalic and atraumatic.  Eyes: Conjunctivae are normal.  Neck: Neck supple. No thyromegaly present.  Cardiovascular: Normal rate, regular rhythm and normal heart sounds.   No murmur heard. Pulmonary/Chest: Effort normal and breath sounds normal. She has no wheezes.  Abdominal: She exhibits no distension and no mass.  Musculoskeletal: She exhibits no edema.  Lymphadenopathy:    She has no cervical adenopathy.  Neurological: She is alert and oriented to person, place, and time.  Skin: Skin is warm and dry. No rash noted. She is not diaphoretic.  Psychiatric: Memory, affect and judgment normal.    Lab Results  Component Value Date   TSH 2.07 04/07/2014   Lab Results  Component Value Date   WBC 9.6 04/07/2014   HGB 12.6 04/07/2014   HCT 38.6 04/07/2014   MCV 89.5 04/07/2014   PLT 371.0 04/07/2014   Lab Results  Component Value Date   CREATININE 0.9 04/07/2014   BUN 14 04/07/2014   NA 138 04/07/2014   K 3.9 04/07/2014   CL 105 04/07/2014   CO2 26 04/07/2014   Lab Results  Component Value Date   ALT 7 04/07/2014   AST 13 04/07/2014   ALKPHOS 71 04/07/2014   BILITOT 0.5 04/07/2014   Lab Results  Component Value Date   CHOL 163 07/17/2011   Lab Results  Component Value Date   HDL 81.90 07/17/2011   Lab Results  Component Value Date   LDLCALC 53 07/17/2011   Lab Results  Component Value Date   TRIG 140.0 07/17/2011     Lab Results  Component Value Date   CHOLHDL 2 07/17/2011     Assessment & Plan  Acute bronchitis Resolved.    Back pain Encouraged to consider chiropractic care and exercise as tolerated. May continue meds prn   Anxiety and depression Doing well, no recent flares. Struggling with stress secondary to her mother having complications during a recent surgery. She is doing better now.   Tachycardia RRR today on current meds

## 2014-07-10 NOTE — Assessment & Plan Note (Signed)
RRR today on current meds

## 2014-07-13 ENCOUNTER — Ambulatory Visit: Payer: BLUE CROSS/BLUE SHIELD | Admitting: Family Medicine

## 2014-08-04 ENCOUNTER — Telehealth: Payer: Self-pay | Admitting: Family Medicine

## 2014-08-04 DIAGNOSIS — E876 Hypokalemia: Secondary | ICD-10-CM

## 2014-08-04 DIAGNOSIS — R52 Pain, unspecified: Secondary | ICD-10-CM

## 2014-08-04 DIAGNOSIS — D649 Anemia, unspecified: Secondary | ICD-10-CM

## 2014-08-04 DIAGNOSIS — F411 Generalized anxiety disorder: Secondary | ICD-10-CM

## 2014-08-04 DIAGNOSIS — R000 Tachycardia, unspecified: Secondary | ICD-10-CM

## 2014-08-04 NOTE — Telephone Encounter (Signed)
Caller name: Jolyne Relation to pt: self Call back number: [phone] Pharmacy:  Reason for call:   Requesting a new rx for diazapam and hydrocodone

## 2014-08-05 MED ORDER — HYDROCODONE-ACETAMINOPHEN 7.5-325 MG PO TABS: 1.0000 | ORAL_TABLET | Freq: Three times a day (TID) | ORAL | Status: DC | PRN

## 2014-08-05 MED ORDER — DIAZEPAM 10 MG PO TABS: 10.0000 mg | ORAL_TABLET | Freq: Two times a day (BID) | ORAL | Status: DC | PRN

## 2014-08-05 NOTE — Telephone Encounter (Signed)
Called the patient informed to pickup hardcopy for both hydrocodone and valium. Left a detailed message prescriptions ready for pickup

## 2014-08-05 NOTE — Telephone Encounter (Signed)
Printed both prescriptions as instructed by PCP and on counter for signature

## 2014-08-05 NOTE — Telephone Encounter (Signed)
She can have a refill on both meds, with same sig, same number no refills

## 2014-08-11 ENCOUNTER — Other Ambulatory Visit: Payer: Self-pay | Admitting: Family Medicine

## 2014-08-19 ENCOUNTER — Other Ambulatory Visit: Payer: Self-pay | Admitting: Family Medicine

## 2014-08-30 ENCOUNTER — Ambulatory Visit (INDEPENDENT_AMBULATORY_CARE_PROVIDER_SITE_OTHER): Payer: BLUE CROSS/BLUE SHIELD | Admitting: Family Medicine

## 2014-08-30 ENCOUNTER — Encounter: Payer: Self-pay | Admitting: Family Medicine

## 2014-08-30 VITALS — BP 118/82 | HR 80 | Temp 98.7°F | Wt 172.0 lb

## 2014-08-30 DIAGNOSIS — R52 Pain, unspecified: Secondary | ICD-10-CM

## 2014-08-30 DIAGNOSIS — F419 Anxiety disorder, unspecified: Secondary | ICD-10-CM

## 2014-08-30 DIAGNOSIS — D649 Anemia, unspecified: Secondary | ICD-10-CM | POA: Diagnosis not present

## 2014-08-30 DIAGNOSIS — F411 Generalized anxiety disorder: Secondary | ICD-10-CM | POA: Diagnosis not present

## 2014-08-30 DIAGNOSIS — F329 Major depressive disorder, single episode, unspecified: Secondary | ICD-10-CM

## 2014-08-30 DIAGNOSIS — R000 Tachycardia, unspecified: Secondary | ICD-10-CM | POA: Diagnosis not present

## 2014-08-30 DIAGNOSIS — F418 Other specified anxiety disorders: Secondary | ICD-10-CM

## 2014-08-30 DIAGNOSIS — F32A Depression, unspecified: Secondary | ICD-10-CM

## 2014-08-30 DIAGNOSIS — J209 Acute bronchitis, unspecified: Secondary | ICD-10-CM

## 2014-08-30 DIAGNOSIS — E663 Overweight: Secondary | ICD-10-CM

## 2014-08-30 DIAGNOSIS — E876 Hypokalemia: Secondary | ICD-10-CM

## 2014-08-30 MED ORDER — HYDROCODONE-ACETAMINOPHEN 7.5-325 MG PO TABS: 1.0000 | ORAL_TABLET | Freq: Three times a day (TID) | ORAL | Status: DC | PRN

## 2014-08-30 MED ORDER — DIAZEPAM 10 MG PO TABS: 10.0000 mg | ORAL_TABLET | Freq: Two times a day (BID) | ORAL | Status: DC | PRN

## 2014-08-30 MED ORDER — VENLAFAXINE HCL ER 150 MG PO CP24: 150.0000 mg | ORAL_CAPSULE | Freq: Every day | ORAL | Status: DC

## 2014-09-04 ENCOUNTER — Other Ambulatory Visit: Payer: Self-pay | Admitting: Family Medicine

## 2014-09-05 ENCOUNTER — Other Ambulatory Visit: Payer: Self-pay | Admitting: Family Medicine

## 2014-09-11 ENCOUNTER — Encounter: Payer: Self-pay | Admitting: Family Medicine

## 2014-09-11 NOTE — Assessment & Plan Note (Signed)
resolved 

## 2014-09-11 NOTE — Progress Notes (Signed)
Kathryn Randall  [id number] [birth year] 09/11/2014      Progress Note-Follow Up  Subjective  Chief Complaint  Chief Complaint  Patient presents with  . Follow-up    HPI  Patient is a 24 y.o. female in today for routine medical care. Patient in today for follow-up on numerous concerns. Overall she's doing better. She notes the change in her medications in particular her Effexor and Seroquel been helpful. She feels less anxious and her anhedonia is improved. No recent illness. Reports palpitations are well controlled at this time. Her back pain and menstrual cramps are manageable with current meds.  Past Medical History  Diagnosis Date  . Anxiety   . Depression   . Back pain   . Migraine 06/28/2011  . Allergic state 06/28/2011  . Preventative health care 06/28/2011  . Anxiety and depression 07/17/2011  . UTI (lower urinary tract infection) 08/18/2011  . Tachycardia 11/14/2011  . Anemia 11/14/2011  . Hypokalemia 11/14/2011  . Overweight(278.02) 04/27/2012  . Dysmenorrhea 05/26/2012  . Neck strain 06/28/2012  . Insomnia 02/21/2013  . Cervical cancer screening 05/26/2012    Past Surgical History  Procedure Laterality Date  . Wisdom tooth extraction  24 yrs old    Family History  Problem Relation Age of Onset  . Migraines Mother   . Arthritis Mother   . Hearing loss Mother   . Menstrual problems Mother   . Migraines Father   . Obesity Maternal Grandmother   . Heart disease Maternal Grandmother     CHF  . Hypertension Maternal Grandmother   . Hearing loss Maternal Grandmother   . Arthritis Maternal Grandmother   . Menstrual problems Maternal Grandmother   . Diabetes Maternal Grandfather     type 2  . Obesity Maternal Grandfather   . Leukemia Paternal Grandfather   . Migraines Paternal Grandfather   . Cancer Paternal Grandfather     leukemia  . Ovarian cysts Paternal Aunt     History   Social History  . Marital Status: Single    Spouse Name: N/A  . Number of  Children: N/A  . Years of Education: N/A   Occupational History  . Not on file.   Social History Main Topics  . Smoking status: Never Smoker   . Smokeless tobacco: Never Used     Comment: black and milds every once in awhile  . Alcohol Use: No  . Drug Use: 7.00 per week    Special: Hydrocodone  . Sexual Activity:    Partners: Male   Other Topics Concern  . Not on file   Social History Narrative    Current Outpatient Prescriptions on File Prior to Visit  Medication Sig Dispense Refill  . albuterol (PROVENTIL HFA;VENTOLIN HFA) 108 (90 BASE) MCG/ACT inhaler Inhale 2 puffs into the lungs every 6 (six) hours as needed for wheezing or shortness of breath. 1 Inhaler 2  . BEYAZ 3-0.02-0.451 MG tablet TAKE AS INSTRUCTED BY YOUR PRESCRIBER 3 tablet 3  . methocarbamol (ROBAXIN) 500 MG tablet Take 1 tablet (500 mg total) by mouth every 8 (eight) hours as needed for muscle spasms. 60 tablet 2  . metoprolol (LOPRESSOR) 50 MG tablet TAKE 1 TABLET BY MOUTH THREE TIMES DAILY 90 tablet 5  . QUEtiapine (SEROQUEL) 300 MG tablet Take 1 tablet (300 mg total) by mouth at bedtime. 30 tablet 3  . triamcinolone cream (KENALOG) 0.1 % Apply 1 application topically 2 (two) times daily. 85.2 g 3   No current facility-administered  medications on file prior to visit.    Allergies  Allergen Reactions  . Ibuprofen     Racing heart  . Morphine And Related Itching    Review of Systems  Review of Systems  Constitutional: Negative for fever and malaise/fatigue.  HENT: Negative for congestion.   Eyes: Negative for discharge.  Respiratory: Negative for shortness of breath.   Cardiovascular: Negative for chest pain, palpitations and leg swelling.  Gastrointestinal: Negative for nausea, abdominal pain and diarrhea.  Genitourinary: Negative for dysuria.  Musculoskeletal: Positive for back pain. Negative for falls.  Skin: Negative for rash.  Neurological: Negative for loss of consciousness and headaches.    Endo/Heme/Allergies: Negative for polydipsia.  Psychiatric/Behavioral: Negative for depression and suicidal ideas. The patient is not nervous/anxious and does not have insomnia.     Objective  BP 118/82 mmHg  Pulse 80  Temp(Src) 98.7 F (37.1 C) (Oral)  Wt 172 lb (78.019 kg)  SpO2 98%  LMP 08/01/2014  Physical Exam  Physical Exam  Constitutional: She is oriented to person, place, and time and well-developed, well-nourished, and in no distress. No distress.  HENT:  Head: Normocephalic and atraumatic.  Eyes: Conjunctivae are normal.  Neck: Neck supple. No thyromegaly present.  Cardiovascular: Normal rate, regular rhythm and normal heart sounds.   No murmur heard. Pulmonary/Chest: Effort normal and breath sounds normal. She has no wheezes.  Abdominal: She exhibits no distension and no mass.  Musculoskeletal: She exhibits no edema.  Lymphadenopathy:    She has no cervical adenopathy.  Neurological: She is alert and oriented to person, place, and time.  Skin: Skin is warm and dry. No rash noted. She is not diaphoretic.  Psychiatric: Memory, affect and judgment normal.    Lab Results  Component Value Date   TSH 2.07 04/07/2014   Lab Results  Component Value Date   WBC 9.6 04/07/2014   HGB 12.6 04/07/2014   HCT 38.6 04/07/2014   MCV 89.5 04/07/2014   PLT 371.0 04/07/2014   Lab Results  Component Value Date   CREATININE 0.9 04/07/2014   BUN 14 04/07/2014   NA 138 04/07/2014   K 3.9 04/07/2014   CL 105 04/07/2014   CO2 26 04/07/2014   Lab Results  Component Value Date   ALT 7 04/07/2014   AST 13 04/07/2014   ALKPHOS 71 04/07/2014   BILITOT 0.5 04/07/2014   Lab Results  Component Value Date   CHOL 163 07/17/2011   Lab Results  Component Value Date   HDL 81.90 07/17/2011   Lab Results  Component Value Date   LDLCALC 53 07/17/2011   Lab Results  Component Value Date   TRIG 140.0 07/17/2011   Lab Results  Component Value Date   CHOLHDL 2 07/17/2011      Assessment & Plan  Acute bronchitis resolved   Anxiety and depression Good response to medication changes doing much better no further changes today   Tachycardia RRR today   Overweight Encouraged DASH diet, decrease po intake and increase exercise as tolerated. Needs 7-8 hours of sleep nightly. Avoid trans fats, eat small, frequent meals every 4-5 hours with lean proteins, complex carbs and healthy fats. Minimize simple carbs

## 2014-09-11 NOTE — Assessment & Plan Note (Signed)
Good response to medication changes doing much better no further changes today

## 2014-09-11 NOTE — Assessment & Plan Note (Signed)
RRR today 

## 2014-09-11 NOTE — Assessment & Plan Note (Signed)
Encouraged DASH diet, decrease po intake and increase exercise as tolerated. Needs 7-8 hours of sleep nightly. Avoid trans fats, eat small, frequent meals every 4-5 hours with lean proteins, complex carbs and healthy fats. Minimize simple carbs 

## 2014-09-13 ENCOUNTER — Telehealth: Payer: Self-pay | Admitting: Family Medicine

## 2014-09-13 MED ORDER — PROMETHAZINE HCL 25 MG PO TABS: 25.0000 mg | ORAL_TABLET | Freq: Three times a day (TID) | ORAL | Status: DC | PRN

## 2014-09-13 NOTE — Telephone Encounter (Signed)
Rx printed and awaiting signature.

## 2014-09-13 NOTE — Telephone Encounter (Signed)
Relation to pt: self  Call back number: [place]: [id number] - [place] [place] [phone] (Phone) [phone] (Fax)         Reason for call:  Pt requesting a refill promethazine (PHENERGAN) 25 MG tablet

## 2014-09-13 NOTE — Telephone Encounter (Signed)
Pt is requesting refill on Promethazine, not currently on med list. Please advise.

## 2014-09-13 NOTE — Telephone Encounter (Signed)
She can have a refill on Promethainz same strength, same sig, #20 no rf, if symptoms persist come in

## 2014-09-15 NOTE — Telephone Encounter (Signed)
Rx faxed to [facility] on [address].

## 2014-09-29 ENCOUNTER — Other Ambulatory Visit: Payer: Self-pay | Admitting: Family Medicine

## 2014-09-29 DIAGNOSIS — R52 Pain, unspecified: Secondary | ICD-10-CM

## 2014-09-29 DIAGNOSIS — D649 Anemia, unspecified: Secondary | ICD-10-CM

## 2014-09-29 DIAGNOSIS — F411 Generalized anxiety disorder: Secondary | ICD-10-CM

## 2014-09-29 DIAGNOSIS — R000 Tachycardia, unspecified: Secondary | ICD-10-CM

## 2014-09-29 DIAGNOSIS — E876 Hypokalemia: Secondary | ICD-10-CM

## 2014-09-29 MED ORDER — HYDROCODONE-ACETAMINOPHEN 7.5-325 MG PO TABS: 1.0000 | ORAL_TABLET | Freq: Three times a day (TID) | ORAL | Status: DC | PRN

## 2014-09-29 NOTE — Telephone Encounter (Signed)
Called the patient informed hardcopy for Hydrocodone #60 with 0 refills is ready for pickup at the front desk.

## 2014-09-29 NOTE — Telephone Encounter (Signed)
Caller name: Danyal Relation to pt: self Call back number: [phone] Pharmacy:  Reason for call:   Requesting hydrocodone rx

## 2014-09-29 NOTE — Telephone Encounter (Signed)
Last office visit 08/30/14 Last refill 08/30/14  #60 with  0 Refills.

## 2014-10-10 ENCOUNTER — Other Ambulatory Visit: Payer: Self-pay | Admitting: Family Medicine

## 2014-10-24 ENCOUNTER — Telehealth: Payer: Self-pay | Admitting: Family Medicine

## 2014-10-24 DIAGNOSIS — D649 Anemia, unspecified: Secondary | ICD-10-CM

## 2014-10-24 DIAGNOSIS — E876 Hypokalemia: Secondary | ICD-10-CM

## 2014-10-24 DIAGNOSIS — R000 Tachycardia, unspecified: Secondary | ICD-10-CM

## 2014-10-24 DIAGNOSIS — R52 Pain, unspecified: Secondary | ICD-10-CM

## 2014-10-24 DIAGNOSIS — F411 Generalized anxiety disorder: Secondary | ICD-10-CM

## 2014-10-24 MED ORDER — HYDROCODONE-ACETAMINOPHEN 7.5-325 MG PO TABS: 1.0000 | ORAL_TABLET | Freq: Three times a day (TID) | ORAL | Status: DC | PRN

## 2014-10-24 NOTE — Telephone Encounter (Signed)
We will refill #30 or she can wait for Dr Charlett Blake for entire rx

## 2014-10-24 NOTE — Telephone Encounter (Signed)
Caller name: Brice Relation to pt: self Call back number: [phone] Pharmacy:  Reason for call:   Requesting a new hydrocodone rx.

## 2014-10-24 NOTE — Telephone Encounter (Signed)
Last refill was on 09/29/14  #60 with 0 refills Last office visit 08/30/14 Advise please

## 2014-10-24 NOTE — Telephone Encounter (Signed)
Called the patient and she is ok with filling #30 for now as she states will see Dr. Charlett Blake soon.  Printed and on counter for signature.  Patient informed to pickup at the front desk tomorrow 10/25/14.

## 2014-10-31 ENCOUNTER — Telehealth: Payer: Self-pay | Admitting: Family Medicine

## 2014-10-31 DIAGNOSIS — R000 Tachycardia, unspecified: Secondary | ICD-10-CM

## 2014-10-31 DIAGNOSIS — F411 Generalized anxiety disorder: Secondary | ICD-10-CM

## 2014-10-31 DIAGNOSIS — R52 Pain, unspecified: Secondary | ICD-10-CM

## 2014-10-31 DIAGNOSIS — E876 Hypokalemia: Secondary | ICD-10-CM

## 2014-10-31 DIAGNOSIS — D649 Anemia, unspecified: Secondary | ICD-10-CM

## 2014-10-31 NOTE — Telephone Encounter (Signed)
Relation to pt: self  Call back number: [phone]   Reason for call:  Pt requesting a 1 month supply HYDROcodone-acetaminophen (NORCO) 7.5-325 MG per tablet. Pt states they refilled on 10/24/14 for 30 days due to Dr. Charlett Blake not being in the office. Please advise

## 2014-10-31 NOTE — Telephone Encounter (Signed)
Patient received only a partial refill as PCP was out of the office.  Advise on remainder of refill to be done.

## 2014-10-31 NOTE — Telephone Encounter (Signed)
OK to give refill on Hydrocodone. Disp enough to make up the rest of a 30 days supply with same strength, same sig,

## 2014-11-01 MED ORDER — HYDROCODONE-ACETAMINOPHEN 7.5-325 MG PO TABS: 1.0000 | ORAL_TABLET | Freq: Three times a day (TID) | ORAL | Status: DC | PRN

## 2014-11-01 NOTE — Telephone Encounter (Signed)
Printed and on counter for signature. 

## 2014-11-01 NOTE — Telephone Encounter (Signed)
Patient informed remainder of refill for hydrocodone has been approved and ready for pickup at the front desk.

## 2014-11-15 ENCOUNTER — Other Ambulatory Visit: Payer: Self-pay | Admitting: Family Medicine

## 2014-11-24 ENCOUNTER — Telehealth: Payer: Self-pay | Admitting: Family Medicine

## 2014-11-24 DIAGNOSIS — F411 Generalized anxiety disorder: Secondary | ICD-10-CM

## 2014-11-24 NOTE — Telephone Encounter (Signed)
Can refill but not til next week

## 2014-11-24 NOTE — Telephone Encounter (Signed)
Requesting Diazepam 10mg -Take 1 tablet by mouth every 12 hours as needed for anxiety and dental work. Last refill:08-30-14;#40,2 Last OV:08-30-14 UDS-CSC signed- but no urine given Please advise.//AB/CMA

## 2014-11-28 ENCOUNTER — Telehealth: Payer: Self-pay | Admitting: Family Medicine

## 2014-11-28 ENCOUNTER — Other Ambulatory Visit: Payer: Self-pay | Admitting: Family Medicine

## 2014-11-28 DIAGNOSIS — F411 Generalized anxiety disorder: Secondary | ICD-10-CM

## 2014-11-28 DIAGNOSIS — E876 Hypokalemia: Secondary | ICD-10-CM

## 2014-11-28 DIAGNOSIS — R52 Pain, unspecified: Secondary | ICD-10-CM

## 2014-11-28 DIAGNOSIS — D649 Anemia, unspecified: Secondary | ICD-10-CM

## 2014-11-28 DIAGNOSIS — R000 Tachycardia, unspecified: Secondary | ICD-10-CM

## 2014-11-28 NOTE — Telephone Encounter (Signed)
error 

## 2014-11-28 NOTE — Telephone Encounter (Signed)
Caller name: Devinne Relation to pt: Call back number: [phone] Pharmacy:  Reason for call:   Requesting hydrocodone rx

## 2014-11-28 NOTE — Telephone Encounter (Signed)
Patient calling back regarding this.  °

## 2014-11-28 NOTE — Telephone Encounter (Signed)
OK to refill requested meds 

## 2014-11-28 NOTE — Telephone Encounter (Signed)
Requesting:  HYDROCODONE Contract SIGNED ON 05/23/14 UDS NONE Last OV  08/30/14 Last Refill  #30 WITH 0 REFILLS ON 11/01/14  Please Advise

## 2014-11-28 NOTE — Telephone Encounter (Signed)
OK to refill hydrocodone with same sig, same number

## 2014-11-29 ENCOUNTER — Ambulatory Visit: Payer: BLUE CROSS/BLUE SHIELD | Admitting: Family Medicine

## 2014-11-29 MED ORDER — DIAZEPAM 10 MG PO TABS: 10.0000 mg | ORAL_TABLET | Freq: Two times a day (BID) | ORAL | Status: DC | PRN

## 2014-11-29 MED ORDER — HYDROCODONE-ACETAMINOPHEN 7.5-325 MG PO TABS
1.0000 | ORAL_TABLET | Freq: Three times a day (TID) | ORAL | Status: DC | PRN
Start: 2014-11-29 — End: 2014-12-13

## 2014-11-29 NOTE — Telephone Encounter (Signed)
Printed and on counter for signature. 

## 2014-11-29 NOTE — Telephone Encounter (Signed)
Called the patient but no answer and voice mail not set up.

## 2014-11-29 NOTE — Telephone Encounter (Signed)
printed valium and on counter for signature.

## 2014-11-29 NOTE — Telephone Encounter (Signed)
Patient called to pickup hardcopy, but no answer and voice mail not set up yet.

## 2014-11-29 NOTE — Addendum Note (Signed)
Addended by: Sharon Seller B on: 11/29/2014 07:22 AM   Modules accepted: Orders

## 2014-11-29 NOTE — Telephone Encounter (Signed)
Called the patient still no answer and voice mail not set up. Both Valium and hydrocodone have been filled and hardcopy on my desk until I can contact this patient.

## 2014-12-01 NOTE — Telephone Encounter (Signed)
Called again this morning no answer and no voice mail set up.

## 2014-12-01 NOTE — Telephone Encounter (Signed)
Called the number in chart for the patient aunt left message to call back

## 2014-12-02 NOTE — Telephone Encounter (Signed)
Called the patients Aunt this morning Kathryn Randall.  She informed the patient boy friend has had her (patient) phone and is in [place], so the patient has no phone.  The patient lives beside the aunt and the aunt stated she will let the patient know to pickup her hardcopy's (both Valium and hydrocodone) at the front desk.

## 2014-12-13 ENCOUNTER — Ambulatory Visit (INDEPENDENT_AMBULATORY_CARE_PROVIDER_SITE_OTHER): Payer: BLUE CROSS/BLUE SHIELD | Admitting: Physician Assistant

## 2014-12-13 ENCOUNTER — Encounter: Payer: Self-pay | Admitting: Physician Assistant

## 2014-12-13 ENCOUNTER — Telehealth: Payer: Self-pay | Admitting: *Deleted

## 2014-12-13 VITALS — BP 106/64 | HR 93 | Temp 98.4°F | Ht 64.0 in | Wt 184.8 lb

## 2014-12-13 DIAGNOSIS — F0781 Postconcussional syndrome: Secondary | ICD-10-CM | POA: Diagnosis not present

## 2014-12-13 DIAGNOSIS — R52 Pain, unspecified: Secondary | ICD-10-CM | POA: Diagnosis not present

## 2014-12-13 MED ORDER — HYDROCODONE-ACETAMINOPHEN 7.5-325 MG PO TABS
1.0000 | ORAL_TABLET | Freq: Three times a day (TID) | ORAL | Status: DC | PRN
Start: 2014-12-13 — End: 2015-01-12

## 2014-12-13 MED ORDER — PROMETHAZINE HCL 25 MG PO TABS: 25.0000 mg | ORAL_TABLET | Freq: Three times a day (TID) | ORAL | Status: DC | PRN

## 2014-12-13 NOTE — Assessment & Plan Note (Signed)
Mild headache and fatigue that is intermittent. Examination within normal limits. Discussed prognosis with patient and reassurance given. Phenergan refilled in case of nausea. Alternate tylenol and ibuprofen if needed for headache. Rest throughout the day when possible. Follow-up if symptoms not continuing to improve.

## 2014-12-13 NOTE — Telephone Encounter (Signed)
FYI-  Patient states she was on a water slide over the weekend and she hit her head (does not know on what because it was dark in the ride).  She felt dizzy at the time and had pain at the site.  She states that someone at the water park checked her pupils and asked her orientation questions, and she was told it was OK.  She states her dizziness is better, but reports a knot on her head that is still sore and giving her a headache.  She did report some nausea this morning (she took an OTC nausea medication) and it has subsided.  She reports no blurred vision.  She states that she felt like sleeping after the event and still feels tired.    Appointment scheduled with Elyn Aquas, PA at 3:15 and he was notified.

## 2014-12-13 NOTE — Patient Instructions (Addendum)
Please alternate Tylenol and Ibuprofen as needed for headache. Too much of the hydrocodone can cause rebound headaches.    Stay well hydrated and get plenty of rest.  Take a nap if needed for fatigue during the middle of the day as your brain needs rest. Continue phenergan as needed for nausea.  Your symptoms will resolve.  Post-Concussion Syndrome Post-concussion syndrome means you have problems after a head injury. The problems can last for weeks or months. The problems usually go away on their own over time. HOME CARE   Only take medicines as told by your doctor. Do not take aspirin.  Sleep with your head raised (elevated) to help with headaches.  Avoid activities that can cause another head injury.  Do not play contact sports like football, hockey, soccer or basketball. Do not do other risky activities like downhill skiing or martial arts, or ride horses until your doctor says it is OK.  Keep all doctor visits as told. GET HELP RIGHT AWAY IF:  You feel confused or very sleepy.  You cannot wake the injured person.  You feel sick to your stomach (nauseous) or keep throwing up (vomiting).  You feel like you are moving when you are not (vertigo).  You notice the injured person's eyes moving back and forth very fast.  You start shaking (convulsing) or pass out (faint).  You have very bad headaches that do not get better with medicine.  You cannot use your arms or legs normally.  The black centers of your eyes (pupils) change size.  You have clear or bloody fluid coming from your nose or ears.  Your problems get worse, not better. MAKE SURE YOU:  Understand these instructions.  Will watch your condition.  Will get help right away if you are not doing well or get worse. Document Released: 07/04/2004 Document Revised: 03/17/2013 Document Reviewed: 09/01/2013 [facility] Patient Information 2015 [place], [place]. This information is not intended to replace advice given to you  by your health care provider. Make sure you discuss any questions you have with your health care provider.

## 2014-12-13 NOTE — Progress Notes (Signed)
Pre visit review using our clinic review tool, if applicable. No additional management support is needed unless otherwise documented below in the visit note. 
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
## 2014-12-20 ENCOUNTER — Ambulatory Visit: Payer: BLUE CROSS/BLUE SHIELD | Admitting: Family Medicine

## 2014-12-22 ENCOUNTER — Telehealth: Payer: Self-pay | Admitting: Family Medicine

## 2014-12-22 ENCOUNTER — Encounter: Payer: Self-pay | Admitting: Family Medicine

## 2014-12-22 NOTE — Telephone Encounter (Signed)
Pt was no show 12/20/14 5:00pm, follow up 15 appt, pt has not rescheduled, mailing letter, charge for no show?

## 2014-12-22 NOTE — Telephone Encounter (Signed)
Yes charge for no show

## 2015-01-11 ENCOUNTER — Other Ambulatory Visit: Payer: Self-pay | Admitting: Family Medicine

## 2015-01-11 NOTE — Telephone Encounter (Signed)
LOV 08/30/14 Labs 04/07/14   Please advise.

## 2015-01-12 ENCOUNTER — Ambulatory Visit (INDEPENDENT_AMBULATORY_CARE_PROVIDER_SITE_OTHER): Payer: BLUE CROSS/BLUE SHIELD | Admitting: Family Medicine

## 2015-01-12 ENCOUNTER — Encounter: Payer: Self-pay | Admitting: Family Medicine

## 2015-01-12 VITALS — BP 102/68 | HR 102 | Temp 98.3°F | Ht 64.0 in | Wt 180.0 lb

## 2015-01-12 DIAGNOSIS — R52 Pain, unspecified: Secondary | ICD-10-CM | POA: Diagnosis not present

## 2015-01-12 DIAGNOSIS — G43809 Other migraine, not intractable, without status migrainosus: Secondary | ICD-10-CM | POA: Diagnosis not present

## 2015-01-12 DIAGNOSIS — F411 Generalized anxiety disorder: Secondary | ICD-10-CM

## 2015-01-12 DIAGNOSIS — E663 Overweight: Secondary | ICD-10-CM | POA: Diagnosis not present

## 2015-01-12 DIAGNOSIS — M545 Low back pain: Secondary | ICD-10-CM

## 2015-01-12 MED ORDER — QUETIAPINE FUMARATE 300 MG PO TABS: 300.0000 mg | ORAL_TABLET | Freq: Every day | ORAL | Status: DC

## 2015-01-12 MED ORDER — METOPROLOL TARTRATE 50 MG PO TABS: 50.0000 mg | ORAL_TABLET | Freq: Three times a day (TID) | ORAL | Status: DC

## 2015-01-12 MED ORDER — METHOCARBAMOL 500 MG PO TABS: ORAL_TABLET | ORAL | Status: DC

## 2015-01-12 MED ORDER — HYDROCODONE-ACETAMINOPHEN 7.5-325 MG PO TABS: 1.0000 | ORAL_TABLET | Freq: Three times a day (TID) | ORAL | Status: DC | PRN

## 2015-01-12 NOTE — Progress Notes (Signed)
Pre visit review using our clinic review tool, if applicable. No additional management support is needed unless otherwise documented below in the visit note. 

## 2015-01-12 NOTE — Progress Notes (Signed)
Kathryn Randall  [id number] [birth year] 01/12/2015      Progress Note-Follow Up  Subjective  Chief Complaint  Chief Complaint  Patient presents with  . Follow-up    HPI  Patient is a 24 y.o. female in today for routine medical care. Patient in reevaluation of numerous conditions. Her back pain is stable, no radicular symptoms or incontinence. No recent injury or flare. Headaches are quieting down after her flare with concussion. No n/v/neurologic complaints. No new concerns. Denies CP/palp/SOB/congestion/fevers/GI or GU c/o. Taking meds as prescribed  Past Medical History  Diagnosis Date  . Anxiety   . Depression   . Back pain   . Migraine 06/28/2011  . Allergic state 06/28/2011  . Preventative health care 06/28/2011  . Anxiety and depression 07/17/2011  . UTI (lower urinary tract infection) 08/18/2011  . Tachycardia 11/14/2011  . Anemia 11/14/2011  . Hypokalemia 11/14/2011  . Overweight(278.02) 04/27/2012  . Dysmenorrhea 05/26/2012  . Neck strain 06/28/2012  . Insomnia 02/21/2013  . Cervical cancer screening 05/26/2012    Past Surgical History  Procedure Laterality Date  . Wisdom tooth extraction  24 yrs old    Family History  Problem Relation Age of Onset  . Migraines Mother   . Arthritis Mother   . Hearing loss Mother   . Menstrual problems Mother   . Migraines Father   . Obesity Maternal Grandmother   . Heart disease Maternal Grandmother     CHF  . Hypertension Maternal Grandmother   . Hearing loss Maternal Grandmother   . Arthritis Maternal Grandmother   . Menstrual problems Maternal Grandmother   . Diabetes Maternal Grandfather     type 2  . Obesity Maternal Grandfather   . Leukemia Paternal Grandfather   . Migraines Paternal Grandfather   . Cancer Paternal Grandfather     leukemia  . Ovarian cysts Paternal Aunt     History   Social History  . Marital Status: Single    Spouse Name: N/A  . Number of Children: N/A  . Years of Education: N/A    Occupational History  . Not on file.   Social History Main Topics  . Smoking status: Never Smoker   . Smokeless tobacco: Never Used     Comment: black and milds every once in awhile  . Alcohol Use: No  . Drug Use: 7.00 per week    Special: Hydrocodone  . Sexual Activity:    Partners: Male   Other Topics Concern  . Not on file   Social History Narrative    Current Outpatient Prescriptions on File Prior to Visit  Medication Sig Dispense Refill  . albuterol (PROVENTIL HFA;VENTOLIN HFA) 108 (90 BASE) MCG/ACT inhaler Inhale 2 puffs into the lungs every 6 (six) hours as needed for wheezing or shortness of breath. 1 Inhaler 2  . BEYAZ 3-0.02-0.451 MG tablet TAKE AS INSTRUCTED BY YOUR PRESCRIBER 3 tablet 2  . diazepam (VALIUM) 10 MG tablet Take 1 tablet (10 mg total) by mouth every 12 (twelve) hours as needed for anxiety. And dental work 40 tablet 2  . HYDROcodone-acetaminophen (NORCO) 7.5-325 MG per tablet Take 1 tablet by mouth 3 (three) times daily as needed for moderate pain. 60 tablet 0  . methocarbamol (ROBAXIN) 500 MG tablet TAKE 1 TABLET BY MOUTH EVERY 8 HOURS AS NEEDED FOR MUSCLE SPASMS 60 tablet 0  . QUEtiapine (SEROQUEL) 300 MG tablet Take 1 tablet (300 mg total) by mouth at bedtime. 30 tablet 3  . triamcinolone cream (  KENALOG) 0.1 % Apply 1 application topically 2 (two) times daily. 85.2 g 3  . venlafaxine XR (EFFEXOR-XR) 150 MG 24 hr capsule Take 1 capsule (150 mg total) by mouth daily. 30 capsule 5  . promethazine (PHENERGAN) 25 MG tablet Take 1 tablet (25 mg total) by mouth every 8 (eight) hours as needed for nausea or vomiting. (Patient not taking: Reported on 01/12/2015) 20 tablet 0   No current facility-administered medications on file prior to visit.    Allergies  Allergen Reactions  . Ibuprofen     Racing heart  . Morphine And Related Itching    Review of Systems  Review of Systems  Constitutional: Negative for fever and malaise/fatigue.  HENT: Negative for  congestion.   Eyes: Negative for discharge.  Respiratory: Negative for shortness of breath.   Cardiovascular: Negative for chest pain, palpitations and leg swelling.  Gastrointestinal: Negative for nausea, abdominal pain and diarrhea.  Genitourinary: Negative for dysuria.  Musculoskeletal: Positive for back pain. Negative for falls.  Skin: Negative for rash.  Neurological: Positive for headaches. Negative for loss of consciousness.  Endo/Heme/Allergies: Negative for polydipsia.  Psychiatric/Behavioral: Negative for depression and suicidal ideas. The patient is not nervous/anxious and does not have insomnia.     Objective  BP 102/68 mmHg  Pulse 102  Temp(Src) 98.3 F (36.8 C) (Oral)  Ht 5\' 4"  (1.626 m)  Wt 180 lb (81.647 kg)  BMI 30.88 kg/m2  SpO2 97%  LMP 11/13/2014  Physical Exam  Physical Exam  Constitutional: She is oriented to person, place, and time and well-developed, well-nourished, and in no distress. No distress.  HENT:  Head: Normocephalic and atraumatic.  Eyes: Conjunctivae are normal.  Neck: Neck supple. No thyromegaly present.  Cardiovascular: Normal rate, regular rhythm and normal heart sounds.   No murmur heard. Pulmonary/Chest: Effort normal and breath sounds normal. She has no wheezes.  Abdominal: She exhibits no distension and no mass.  Musculoskeletal: She exhibits no edema.  Lymphadenopathy:    She has no cervical adenopathy.  Neurological: She is alert and oriented to person, place, and time.  Skin: Skin is warm and dry. No rash noted. She is not diaphoretic.  Psychiatric: Memory, affect and judgment normal.    Lab Results  Component Value Date   TSH 2.07 04/07/2014   Lab Results  Component Value Date   WBC 9.6 04/07/2014   HGB 12.6 04/07/2014   HCT 38.6 04/07/2014   MCV 89.5 04/07/2014   PLT 371.0 04/07/2014   Lab Results  Component Value Date   CREATININE 0.9 04/07/2014   BUN 14 04/07/2014   NA 138 04/07/2014   K 3.9 04/07/2014    CL 105 04/07/2014   CO2 26 04/07/2014   Lab Results  Component Value Date   ALT 7 04/07/2014   AST 13 04/07/2014   ALKPHOS 71 04/07/2014   BILITOT 0.5 04/07/2014   Lab Results  Component Value Date   CHOL 163 07/17/2011   Lab Results  Component Value Date   HDL 81.90 07/17/2011   Lab Results  Component Value Date   LDLCALC 53 07/17/2011   Lab Results  Component Value Date   TRIG 140.0 07/17/2011   Lab Results  Component Value Date   CHOLHDL 2 07/17/2011     Assessment & Plan  Migraine Encouraged increased hydration, 64 ounces of clear fluids daily. Minimize alcohol and caffeine. Eat small frequent meals with lean proteins and complex carbs. Avoid high and low blood sugars. Get adequate sleep,  7-8 hours a night. Needs exercise daily preferably in the morning. No recent flares. Still responds to pain meds prn  Overweight Encouraged DASH diet, decrease po intake and increase exercise as tolerated. Needs 7-8 hours of sleep nightly. Avoid trans fats, eat small, frequent meals every 4-5 hours with lean proteins, complex carbs and healthy fats. Minimize simple carbs  Back pain Encouraged moist heat and gentle stretching as tolerated. May try NSAIDs and prescription meds as directed and report if symptoms worsen or seek immediate care

## 2015-01-12 NOTE — Patient Instructions (Signed)
Nonspecific Tachycardia  Tachycardia is a faster than normal heartbeat (more than 100 beats per minute). In adults, the heart normally beats between 60 and 100 times a minute. A fast heartbeat may be a normal response to exercise or stress. It does not necessarily mean that something is wrong. However, sometimes when your heart beats too fast it may not be able to pump enough blood to the rest of your body. This can result in chest pain, shortness of breath, dizziness, and even fainting. Nonspecific tachycardia means that the specific cause or pattern of your tachycardia is unknown.  CAUSES   Tachycardia may be harmless or it may be due to a more serious underlying cause. Possible causes of tachycardia include:  · Exercise or exertion.  · Fever.  · Pain or injury.  · Infection.  · Loss of body fluids (dehydration).  · Overactive thyroid.  · Lack of red blood cells (anemia).  · Anxiety and stress.  · Alcohol.  · Caffeine.  · Tobacco products.  · Diet pills.  · Illegal drugs.  · Heart disease.  SYMPTOMS  · Rapid or irregular heartbeat (palpitations).  · Suddenly feeling your heart beating (cardiac awareness).  · Dizziness.  · Tiredness (fatigue).  · Shortness of breath.  · Chest pain.  · Nausea.  · Fainting.  DIAGNOSIS   Your caregiver will perform a physical exam and take your medical history. In some cases, a heart specialist (cardiologist) may be consulted. Your caregiver may also order:  · Blood tests.  · Electrocardiography. This test records the electrical activity of your heart.  · A heart monitoring test.  TREATMENT   Treatment will depend on the likely cause of your tachycardia. The goal is to treat the underlying cause of your tachycardia. Treatment methods may include:  · Replacement of fluids or blood through an intravenous (IV) tube for moderate to severe dehydration or anemia.  · New medicines or changes in your current medicines.  · Diet and lifestyle changes.  · Treatment for certain  infections.  · Stress relief or relaxation methods.  HOME CARE INSTRUCTIONS   · Rest.  · Drink enough fluids to keep your urine clear or pale yellow.  · Do not smoke.  · Avoid:  ¨ Caffeine.  ¨ Tobacco.  ¨ Alcohol.  ¨ Chocolate.  ¨ Stimulants such as over-the-counter diet pills or pills that help you stay awake.  ¨ Situations that cause anxiety or stress.  ¨ Illegal drugs such as marijuana, phencyclidine (PCP), and cocaine.  · Only take medicine as directed by your caregiver.  · Keep all follow-up appointments as directed by your caregiver.  SEEK IMMEDIATE MEDICAL CARE IF:   · You have pain in your chest, upper arms, jaw, or neck.  · You become weak, dizzy, or feel faint.  · You have palpitations that will not go away.  · You vomit, have diarrhea, or pass blood in your stool.  · Your skin is cool, pale, and wet.  · You have a fever that will not go away with rest, fluids, and medicine.  MAKE SURE YOU:   · Understand these instructions.  · Will watch your condition.  · Will get help right away if you are not doing well or get worse.  Document Released: 07/04/2004 Document Revised: 08/19/2011 Document Reviewed: 05/07/2011  Exit[employer]® Patient Information ©2015 [employer]. This information is not intended to replace advice given to you by your health care provider. Make sure you discuss any questions   you have with your health care provider.

## 2015-01-22 NOTE — Assessment & Plan Note (Signed)
Encouraged increased hydration, 64 ounces of clear fluids daily. Minimize alcohol and caffeine. Eat small frequent meals with lean proteins and complex carbs. Avoid high and low blood sugars. Get adequate sleep, 7-8 hours a night. Needs exercise daily preferably in the morning. No recent flares. Still responds to pain meds prn

## 2015-01-22 NOTE — Assessment & Plan Note (Signed)
Encouraged moist heat and gentle stretching as tolerated. May try NSAIDs and prescription meds as directed and report if symptoms worsen or seek immediate care 

## 2015-01-22 NOTE — Assessment & Plan Note (Signed)
Encouraged DASH diet, decrease po intake and increase exercise as tolerated. Needs 7-8 hours of sleep nightly. Avoid trans fats, eat small, frequent meals every 4-5 hours with lean proteins, complex carbs and healthy fats. Minimize simple carbs 

## 2015-02-09 ENCOUNTER — Other Ambulatory Visit: Payer: Self-pay | Admitting: Family Medicine

## 2015-02-09 DIAGNOSIS — R52 Pain, unspecified: Secondary | ICD-10-CM

## 2015-02-09 MED ORDER — HYDROCODONE-ACETAMINOPHEN 7.5-325 MG PO TABS: 1.0000 | ORAL_TABLET | Freq: Three times a day (TID) | ORAL | Status: DC | PRN

## 2015-02-09 NOTE — Telephone Encounter (Signed)
Pt needing refill on hydrocodone. Has 4 days but we are closed on Monday. Please call when ready to pick up.

## 2015-02-09 NOTE — Telephone Encounter (Signed)
Requesting: hydrocodone Contract signed on 04/07/14 UDS none Last OV  01/12/15 Last Refill  #[id number] with 0 refill on 01/12/15  Please Advise

## 2015-02-10 MED ORDER — HYDROCODONE-ACETAMINOPHEN 7.5-325 MG PO TABS: 1.0000 | ORAL_TABLET | Freq: Three times a day (TID) | ORAL | Status: DC | PRN

## 2015-02-10 NOTE — Telephone Encounter (Signed)
Hardcopy printed and on counter for PCP signature.

## 2015-02-10 NOTE — Addendum Note (Signed)
Addended by: Sharon Seller B on: 02/10/2015 07:07 AM   Modules accepted: Orders

## 2015-02-10 NOTE — Telephone Encounter (Signed)
Patient informed by a mychart message hardcopy can be picked up.

## 2015-03-02 ENCOUNTER — Other Ambulatory Visit: Payer: Self-pay | Admitting: Family Medicine

## 2015-03-02 DIAGNOSIS — F411 Generalized anxiety disorder: Secondary | ICD-10-CM

## 2015-03-02 MED ORDER — DIAZEPAM 10 MG PO TABS: 10.0000 mg | ORAL_TABLET | Freq: Two times a day (BID) | ORAL | Status: DC | PRN

## 2015-03-02 NOTE — Telephone Encounter (Signed)
Requesting: Valium Contract  Signed on 04/07/14 UDS  none Last OV   01/12/15 Last Refill   $40 with 2 refills on 11/29/14  Please Advise

## 2015-03-02 NOTE — Telephone Encounter (Signed)
Faxed hardcopy for Valium to [facility]

## 2015-03-07 ENCOUNTER — Encounter: Payer: Self-pay | Admitting: Family Medicine

## 2015-03-10 ENCOUNTER — Other Ambulatory Visit: Payer: Self-pay | Admitting: Family Medicine

## 2015-03-10 DIAGNOSIS — R52 Pain, unspecified: Secondary | ICD-10-CM

## 2015-03-10 MED ORDER — HYDROCODONE-ACETAMINOPHEN 7.5-325 MG PO TABS: 1.0000 | ORAL_TABLET | Freq: Three times a day (TID) | ORAL | Status: DC | PRN

## 2015-04-08 ENCOUNTER — Other Ambulatory Visit: Payer: Self-pay | Admitting: Family Medicine

## 2015-04-10 ENCOUNTER — Encounter: Payer: Self-pay | Admitting: Family Medicine

## 2015-04-10 ENCOUNTER — Other Ambulatory Visit: Payer: Self-pay | Admitting: Family Medicine

## 2015-04-11 ENCOUNTER — Other Ambulatory Visit: Payer: BLUE CROSS/BLUE SHIELD

## 2015-04-11 ENCOUNTER — Other Ambulatory Visit: Payer: Self-pay | Admitting: Family Medicine

## 2015-04-11 DIAGNOSIS — R52 Pain, unspecified: Secondary | ICD-10-CM

## 2015-04-11 MED ORDER — METHOCARBAMOL 500 MG PO TABS: ORAL_TABLET | ORAL | Status: DC

## 2015-04-11 MED ORDER — HYDROCODONE-ACETAMINOPHEN 7.5-325 MG PO TABS: 1.0000 | ORAL_TABLET | Freq: Three times a day (TID) | ORAL | Status: DC | PRN

## 2015-04-11 NOTE — Telephone Encounter (Signed)
Sent in robaxin to the pharmacy per PCP instructions via mychart request. Printed hydrocodone prescription (note attached needs UDS) pt. Contacted by mychart to pickup at the front  Desk.

## 2015-04-14 ENCOUNTER — Ambulatory Visit: Payer: BLUE CROSS/BLUE SHIELD | Admitting: Family Medicine

## 2015-04-14 DIAGNOSIS — Z0289 Encounter for other administrative examinations: Secondary | ICD-10-CM

## 2015-04-16 ENCOUNTER — Other Ambulatory Visit: Payer: Self-pay | Admitting: Family Medicine

## 2015-04-20 ENCOUNTER — Telehealth: Payer: Self-pay | Admitting: Family Medicine

## 2015-04-20 NOTE — Telephone Encounter (Signed)
Charge  Please start letter to discharge patient

## 2015-04-20 NOTE — Telephone Encounter (Signed)
Pt was no show 04/14/15 3:15pm for follow up appt, pt has not rescheduled, 3rd no show in past 12 months, charge or no charge?

## 2015-04-25 ENCOUNTER — Telehealth: Payer: Self-pay | Admitting: Family Medicine

## 2015-04-25 ENCOUNTER — Encounter: Payer: Self-pay | Admitting: Family Medicine

## 2015-04-25 NOTE — Telephone Encounter (Signed)
PCP dismissed this patient due to recurrent missed appointments.  Letter should go out today 04/25/2015.  The patient also failed Urine Drug Screening test and Patient should not received during the 30 days emergency care refills on any controlled medication.

## 2015-04-27 ENCOUNTER — Telehealth: Payer: Self-pay | Admitting: Family Medicine

## 2015-04-27 NOTE — Telephone Encounter (Signed)
Patient dismissed from [facility] by Penni Homans MD , effective April 25, 2015. Dismissal letter sent out by certified / registered mail.  DAJ  Received signed domestic return receipt verifying delivery of certified letter on May 09, 2015. Article number [id number] [place]

## 2015-05-09 ENCOUNTER — Other Ambulatory Visit: Payer: Self-pay | Admitting: Family Medicine

## 2015-05-09 ENCOUNTER — Telehealth: Payer: Self-pay | Admitting: Family Medicine

## 2015-05-09 ENCOUNTER — Other Ambulatory Visit: Payer: Self-pay | Admitting: Physician Assistant

## 2015-05-09 MED ORDER — PROMETHAZINE HCL 25 MG PO TABS: 25.0000 mg | ORAL_TABLET | Freq: Three times a day (TID) | ORAL | Status: DC | PRN

## 2015-05-10 ENCOUNTER — Telehealth: Payer: Self-pay

## 2015-05-10 NOTE — Telephone Encounter (Signed)
Per Elyn Aquas PA . If pt is having thoughts of hurting herself or having suicidal ideations recommends seeing provider at [place] .Marland Kitchen If not she can try to schedule a follow-up with Dr. Charlett Blake within her 30 day window on receiving the dismiss letter for emergencies. Lmovm x2.

## 2015-05-10 NOTE — Telephone Encounter (Signed)
Patient called in very upset that Dr. Charlett Blake dismissed her and said that Dr Charlett Blake has been the only provider that she can talk to that doesn't think she is crazy and has been the only one to talk her out of killing herself on several occasions. She said she did not know what to do from here out for a Doctor because she has no one else. I informed her that we would see her at the letter states for 30 days for emergencies. Patient said she still cannot believe she was dismissed because " Dr. Charlett Blake " knows her condition. And thanked me for talking with her

## 2015-05-10 NOTE — Telephone Encounter (Signed)
Please let her know when people miss numerous appts we dismiss them since so many people need appts. That being said I would be willing to keep seeing her and try again. She just needs to know she failed her drug test (it might be getting scanned at the moment) so I will not be able to prescribe controlled substances for her any longer per my practice recommendations.

## 2015-05-11 NOTE — Telephone Encounter (Signed)
Patient scheduled.

## 2015-05-11 NOTE — Telephone Encounter (Signed)
Patient sent My Chart message, PCP does not have any immediate appointments please advise what provider should see patient:   Message    Appointment Request From: Kathryn Randall      With Provider: Penni Homans, [place] at [facility]]      Preferred Date Range: From 05/10/2015 To 05/25/2015      Reason for visit: Office Visit      Comments:   Dear Dr. Randel Pigg,         I know that being dismissed is my own fault, but I was told that if I felt it was an emergency and if you would accept seeing me before the 15th of December that I could come in for one more visit. I've cried everyday since I got the letter. I just REALLY need to see you. My mental state is not good right now and I cant see another doctor for it; I know I would not be able to talk to them. Something is wrong with me I have no drive and I sleep [id number] of the time just because I don't want to deal with my life. Also I think I have an infection UTI or my bladder, my urine has been dark.Marland KitchenPLEASE I AM BEGGING YOU. Please see me one more time.   Again I AM SO SORRY FOR WASTING YOUR TIME! but thank you for never thinking I'm crazy   and THANK you for being a good doctor to me!

## 2015-05-11 NOTE — Telephone Encounter (Signed)
Please find her a spot I will see her but I cannot stop and find a spot

## 2015-05-12 NOTE — Telephone Encounter (Signed)
Called per PCP instructions to verify appt. No answer/voice mail not set up yet.

## 2015-05-16 ENCOUNTER — Encounter: Payer: Self-pay | Admitting: Family Medicine

## 2015-05-16 ENCOUNTER — Ambulatory Visit (INDEPENDENT_AMBULATORY_CARE_PROVIDER_SITE_OTHER): Payer: BLUE CROSS/BLUE SHIELD | Admitting: Family Medicine

## 2015-05-16 VITALS — BP 108/78 | HR 84 | Temp 98.3°F | Ht 64.0 in | Wt 174.5 lb

## 2015-05-16 DIAGNOSIS — F329 Major depressive disorder, single episode, unspecified: Secondary | ICD-10-CM

## 2015-05-16 DIAGNOSIS — F418 Other specified anxiety disorders: Secondary | ICD-10-CM | POA: Diagnosis not present

## 2015-05-16 DIAGNOSIS — F32A Depression, unspecified: Secondary | ICD-10-CM

## 2015-05-16 DIAGNOSIS — R000 Tachycardia, unspecified: Secondary | ICD-10-CM | POA: Diagnosis not present

## 2015-05-16 DIAGNOSIS — Z23 Encounter for immunization: Secondary | ICD-10-CM

## 2015-05-16 DIAGNOSIS — F419 Anxiety disorder, unspecified: Secondary | ICD-10-CM

## 2015-05-16 DIAGNOSIS — G43809 Other migraine, not intractable, without status migrainosus: Secondary | ICD-10-CM

## 2015-05-16 DIAGNOSIS — G4700 Insomnia, unspecified: Secondary | ICD-10-CM

## 2015-05-16 MED ORDER — VENLAFAXINE HCL ER 75 MG PO CP24: 225.0000 mg | ORAL_CAPSULE | Freq: Every day | ORAL | Status: DC

## 2015-05-16 MED ORDER — QUETIAPINE FUMARATE 200 MG PO TABS: 200.0000 mg | ORAL_TABLET | Freq: Every day | ORAL | Status: DC

## 2015-05-16 MED ORDER — METHOCARBAMOL 500 MG PO TABS: ORAL_TABLET | ORAL | Status: DC

## 2015-05-16 MED ORDER — METOPROLOL TARTRATE 50 MG PO TABS: ORAL_TABLET | ORAL | Status: DC

## 2015-05-16 NOTE — Patient Instructions (Signed)
Bipolar Disorder Bipolar disorder is a mental illness. The term bipolar disorder actually is used to describe a group of disorders that all share varying degrees of emotional highs and lows that can interfere with daily functioning, such as work, school, or relationships. Bipolar disorder also can lead to drug abuse, hospitalization, and suicide. The emotional highs of bipolar disorder are periods of elation or irritability and high energy. These highs can range from a mild form (hypomania) to a severe form (mania). People experiencing episodes of hypomania may appear energetic, excitable, and highly productive. People experiencing mania may behave impulsively or erratically. They often make poor decisions. They may have difficulty sleeping. The most severe episodes of mania can involve having very distorted beliefs or perceptions about the world and seeing or hearing things that are not real (psychotic delusions and hallucinations).  The emotional lows of bipolar disorder (depression) also can range from mild to severe. Severe episodes of bipolar depression can involve psychotic delusions and hallucinations. Sometimes people with bipolar disorder experience a state of mixed mood. Symptoms of hypomania or mania and depression are both present during this mixed-mood episode. SIGNS AND SYMPTOMS There are signs and symptoms of the episodes of hypomania and mania as well as the episodes of depression. The signs and symptoms of hypomania and mania are similar but vary in severity. They include:  Inflated self-esteem or feeling of increased self-confidence.  Decreased need for sleep.  Unusual talkativeness (rapid or pressured speech) or the feeling of a need to keep talking.  Sensation of racing thoughts or constant talking, with quick shifts between topics that may or may not be related (flight of ideas).  Decreased ability to focus or concentrate.  Increased purposeful activity, such as work, studies,  or social activity, or nonproductive activity, such as pacing, squirming and fidgeting, or finger and toe tapping.  Impulsive behavior and use of poor judgment, resulting in high-risk activities, such as having unprotected sex or spending excessive amounts of money. Signs and symptoms of depression include the following:   Feelings of sadness, hopelessness, or helplessness.  Frequent or uncontrollable episodes of crying.  Lack of feeling anything or caring about anything.  Difficulty sleeping or sleeping too much.  Inability to enjoy the things you used to enjoy.   Desire to be alone all the time.   Feelings of guilt or worthlessness.  Lack of energy or motivation.   Difficulty concentrating, remembering, or making decisions.  Change in appetite or weight beyond normal fluctuations.  Thoughts of death or the desire to harm yourself. DIAGNOSIS  Bipolar disorder is diagnosed through an assessment by your caregiver. Your caregiver will ask questions about your emotional episodes. There are two main types of bipolar disorder. People with type I bipolar disorder have manic episodes with or without depressive episodes. People with type II bipolar disorder have hypomanic episodes and major depressive episodes, which are more serious than mild depression. The type of bipolar disorder you have can make an important difference in how your illness is monitored and treated. Your caregiver may ask questions about your medical history and use of alcohol or drugs, including prescription medication. Certain medical conditions and substances also can cause emotional highs and lows that resemble bipolar disorder (secondary bipolar disorder).  TREATMENT  Bipolar disorder is a long-term illness. It is best controlled with continuous treatment rather than treatment only when symptoms occur. The following treatments can be prescribed for bipolar disorders:  Medication--Medication can be prescribed by  a doctor that  is an expert in treating mental disorders (psychiatrists). Medications called mood stabilizers are usually prescribed to help control the illness. Other medications are sometimes added if symptoms of mania, depression, or psychotic delusions and hallucinations occur despite the use of a mood stabilizer.  Talk therapy--Some forms of talk therapy are helpful in providing support, education, and guidance. A combination of medication and talk therapy is best for managing the disorder over time. A procedure in which electricity is applied to your brain through your scalp (electroconvulsive therapy) is used in cases of severe mania when medication and talk therapy do not work or work too slowly.   This information is not intended to replace advice given to you by your health care provider. Make sure you discuss any questions you have with your health care provider.   Document Released: 09/02/2000 Document Revised: 06/17/2014 Document Reviewed: 06/22/2012 Elsevier Interactive Patient Education 2016 [employer] Major Depressive Disorder Major depressive disorder is a mental illness. It also may be called clinical depression or unipolar depression. Major depressive disorder usually causes feelings of sadness, hopelessness, or helplessness. Some people with this disorder do not feel particularly sad but lose interest in doing things they used to enjoy (anhedonia). Major depressive disorder also can cause physical symptoms. It can interfere with work, school, relationships, and other normal everyday activities. The disorder varies in severity but is longer lasting and more serious than the sadness we all feel from time to time in our lives. Major depressive disorder often is triggered by stressful life events or major life changes. Examples of these triggers include divorce, loss of your job or home, a move, and the death of a family member or close friend. Sometimes this disorder occurs for no  obvious reason at all. People who have family members with major depressive disorder or bipolar disorder are at higher risk for developing this disorder, with or without life stressors. Major depressive disorder can occur at any age. It may occur just once in your life (single episode major depressive disorder). It may occur multiple times (recurrent major depressive disorder). SYMPTOMS People with major depressive disorder have either anhedonia or depressed mood on nearly a daily basis for at least 2 weeks or longer. Symptoms of depressed mood include:  Feelings of sadness (blue or down in the dumps) or emptiness.  Feelings of hopelessness or helplessness.  Tearfulness or episodes of crying (may be observed by others).  Irritability (children and adolescents). In addition to depressed mood or anhedonia or both, people with this disorder have at least four of the following symptoms:  Difficulty sleeping or sleeping too much.   Significant change (increase or decrease) in appetite or weight.   Lack of energy or motivation.  Feelings of guilt and worthlessness.   Difficulty concentrating, remembering, or making decisions.  Unusually slow movement (psychomotor retardation) or restlessness (as observed by others).   Recurrent wishes for death, recurrent thoughts of self-harm (suicide), or a suicide attempt. People with major depressive disorder commonly have persistent negative thoughts about themselves, other people, and the world. People with severe major depressive disorder may experiencedistorted beliefs or perceptions about the world (psychotic delusions). They also may see or hear things that are not real (psychotic hallucinations). DIAGNOSIS Major depressive disorder is diagnosed through an assessment by your health care provider. Your health care provider will ask aboutaspects of your daily life, such as mood,sleep, and appetite, to see if you have the diagnostic symptoms of  major depressive disorder. Your health care provider  may ask about your medical history and use of alcohol or drugs, including prescription medicines. Your health care provider also may do a physical exam and blood work. This is because certain medical conditions and the use of certain substances can cause major depressive disorder-like symptoms (secondary depression). Your health care provider also may refer you to a mental health specialist for further evaluation and treatment. TREATMENT It is important to recognize the symptoms of major depressive disorder and seek treatment. The following treatments can be prescribed for this disorder:   Medicine. Antidepressant medicines usually are prescribed. Antidepressant medicines are thought to correct chemical imbalances in the brain that are commonly associated with major depressive disorder. Other types of medicine may be added if the symptoms do not respond to antidepressant medicines alone or if psychotic delusions or hallucinations occur.  Talk therapy. Talk therapy can be helpful in treating major depressive disorder by providing support, education, and guidance. Certain types of talk therapy also can help with negative thinking (cognitive behavioral therapy) and with relationship issues that trigger this disorder (interpersonal therapy). A mental health specialist can help determine which treatment is best for you. Most people with major depressive disorder do well with a combination of medicine and talk therapy. Treatments involving electrical stimulation of the brain can be used in situations with extremely severe symptoms or when medicine and talk therapy do not work over time. These treatments include electroconvulsive therapy, transcranial magnetic stimulation, and vagal nerve stimulation.   This information is not intended to replace advice given to you by your health care provider. Make sure you discuss any questions you have with your health care  provider.   Document Released: 09/21/2012 Document Revised: 06/17/2014 Document Reviewed: 09/21/2012 Elsevier Interactive Patient Education [employer].

## 2015-05-16 NOTE — Progress Notes (Signed)
Pre visit review using our clinic review tool, if applicable. No additional management support is needed unless otherwise documented below in the visit note. 

## 2015-05-21 ENCOUNTER — Encounter: Payer: Self-pay | Admitting: Family Medicine

## 2015-05-21 NOTE — Assessment & Plan Note (Signed)
Encouraged good sleep hygiene such as dark, quiet room. No blue/green glowing lights such as computer screens in bedroom. No alcohol or stimulants in evening. Cut down on caffeine as able. Regular exercise is helpful but not just prior to bed time.  

## 2015-05-21 NOTE — Assessment & Plan Note (Addendum)
Patient in today continues to struggle with family stressors, life stressors and is very sad over her possible dismissal from the practice. She began to miss appointments regularly and failed a drug test so she was dismissed from the practice. After discussion today we will attempt to keep her in the practice but she is aware she will not get any further controlled substances from the practice. She is agreeable. For now we will decrease her seroquel to 200 mg to see if that helps her somnolence and we will use Venlafaxine to 75 mg po tid. She will call with any concerns. She is referred to Psychiatry and to a counselor to help her move forward and return her in 1-2 months for reevaluation

## 2015-05-21 NOTE — Assessment & Plan Note (Signed)
Improved on recheck continue metoprolol

## 2015-05-21 NOTE — Progress Notes (Signed)
Subjective:    Patient ID: Kathryn Randall, female    DOB: 11-18-1990, 24 y.o.   MRN: [id number]  Chief Complaint  Patient presents with  . Follow-up    HPI Patient is in today for follow up and discuss her ongoing care. She was dismissed from the practice recently due to recurrent no shows and failing a drug test. She is tearful and really wants to stay with the practice. She expresses the understanding that she cannot continue to miss appointments without notice and she is aware that she will no longer receive controlled substances from our office. She continues to struggle with significant stressors with her family. She has a very strained relationship with her parents. She is here today with her boyfriend and he confirms that the family continues to have significant internal struggles. Patient continues to work. She continues to struggle with daily back pain and intermittent headaches. She has significant menstrual pain as well. She acknowledges high levels of irritability and lability. She endorses the fact that she likely has bipolar disorder as does her mother. No recent physical illness that is new. Denies CP/palp/SOB/HA/congestion/fevers/GI or GU c/o. Taking meds as prescribed  Past Medical History  Diagnosis Date  . Anxiety   . Depression   . Back pain   . Migraine 06/28/2011  . Allergic state 06/28/2011  . Preventative health care 06/28/2011  . Anxiety and depression 07/17/2011  . UTI (lower urinary tract infection) 08/18/2011  . Tachycardia 11/14/2011  . Anemia 11/14/2011  . Hypokalemia 11/14/2011  . Overweight(278.02) 04/27/2012  . Dysmenorrhea 05/26/2012  . Neck strain 06/28/2012  . Insomnia 02/21/2013  . Cervical cancer screening 05/26/2012    Past Surgical History  Procedure Laterality Date  . Wisdom tooth extraction  24 yrs old    Family History  Problem Relation Age of Onset  . Migraines Mother   . Arthritis Mother   . Hearing loss Mother   . Menstrual problems  Mother   . Migraines Father   . Obesity Maternal Grandmother   . Heart disease Maternal Grandmother     CHF  . Hypertension Maternal Grandmother   . Hearing loss Maternal Grandmother   . Arthritis Maternal Grandmother   . Menstrual problems Maternal Grandmother   . Diabetes Maternal Grandfather     type 2  . Obesity Maternal Grandfather   . Leukemia Paternal Grandfather   . Migraines Paternal Grandfather   . Cancer Paternal Grandfather     leukemia  . Ovarian cysts Paternal Aunt     Social History   Social History  . Marital Status: Single    Spouse Name: N/A  . Number of Children: N/A  . Years of Education: N/A   Occupational History  . Not on file.   Social History Main Topics  . Smoking status: Never Smoker   . Smokeless tobacco: Never Used     Comment: black and milds every once in awhile  . Alcohol Use: No  . Drug Use: 7.00 per week    Special: Hydrocodone  . Sexual Activity:    Partners: Male   Other Topics Concern  . Not on file   Social History Narrative    Outpatient Prescriptions Prior to Visit  Medication Sig Dispense Refill  . albuterol (PROVENTIL HFA;VENTOLIN HFA) 108 (90 BASE) MCG/ACT inhaler Inhale 2 puffs into the lungs every 6 (six) hours as needed for wheezing or shortness of breath. 1 Inhaler 2  . triamcinolone cream (KENALOG) 0.1 % Apply 1  application topically 2 (two) times daily. 85.2 g 3  . BEYAZ 3-0.02-0.451 MG tablet TAKE AS INSTRUCTED BY YOUR PRESCRIBER 3 tablet 2  . diazepam (VALIUM) 10 MG tablet Take 1 tablet (10 mg total) by mouth every 12 (twelve) hours as needed for anxiety. And dental work 40 tablet 2  . HYDROcodone-acetaminophen (NORCO) 7.5-325 MG tablet Take 1 tablet by mouth 3 (three) times daily as needed for moderate pain. 60 tablet 0  . methocarbamol (ROBAXIN) 500 MG tablet TAKE 1 TABLET BY MOUTH EVERY 8 HOURS AS NEEDED FOR MUSCLE SPASMS 60 tablet 1  . metoprolol (LOPRESSOR) 50 MG tablet TAKE 1 TABLET(50 MG) BY MOUTH THREE  TIMES DAILY 90 tablet 0  . promethazine (PHENERGAN) 25 MG tablet Take 1 tablet (25 mg total) by mouth every 8 (eight) hours as needed for nausea or vomiting. 20 tablet 0  . QUEtiapine (SEROQUEL) 300 MG tablet Take 1 tablet (300 mg total) by mouth at bedtime. 30 tablet 3  . venlafaxine XR (EFFEXOR-XR) 150 MG 24 hr capsule Take 1 capsule (150 mg total) by mouth daily. 30 capsule 5  . metoprolol (LOPRESSOR) 50 MG tablet TAKE 1 TABLET(50 MG) BY MOUTH THREE TIMES DAILY 90 tablet 0   No facility-administered medications prior to visit.    Allergies  Allergen Reactions  . Ibuprofen     Racing heart  . Morphine And Related Itching    Review of Systems  Constitutional: Negative for fever and malaise/fatigue.  HENT: Negative for congestion.   Eyes: Negative for discharge.  Respiratory: Negative for shortness of breath.   Cardiovascular: Negative for chest pain, palpitations and leg swelling.  Gastrointestinal: Negative for nausea and abdominal pain.  Genitourinary: Negative for dysuria.  Musculoskeletal: Positive for back pain. Negative for falls.  Skin: Negative for rash.  Neurological: Positive for headaches. Negative for loss of consciousness.  Endo/Heme/Allergies: Negative for environmental allergies.  Psychiatric/Behavioral: Positive for depression. The patient is nervous/anxious and has insomnia.        Objective:    Physical Exam  Constitutional: She is oriented to person, place, and time. She appears well-developed and well-nourished. No distress.  HENT:  Head: Normocephalic and atraumatic.  Nose: Nose normal.  Eyes: Right eye exhibits no discharge. Left eye exhibits no discharge.  Neck: Normal range of motion. Neck supple.  Cardiovascular: Normal rate and regular rhythm.   No murmur heard. Pulmonary/Chest: Effort normal and breath sounds normal.  Abdominal: Soft. Bowel sounds are normal. There is no tenderness.  Musculoskeletal: She exhibits no edema.  Neurological: She  is alert and oriented to person, place, and time.  Skin: Skin is warm and dry.  Psychiatric: She has a normal mood and affect.  Nursing note and vitals reviewed.   BP 108/78 mmHg  Pulse 84  Temp(Src) 98.3 F (36.8 C) (Oral)  Ht 5\' 4"  (1.626 m)  Wt 174 lb 8 oz (79.153 kg)  BMI 29.94 kg/m2  SpO2 96% Wt Readings from Last 3 Encounters:  05/16/15 174 lb 8 oz (79.153 kg)  01/12/15 180 lb (81.647 kg)  12/13/14 184 lb 12.8 oz (83.825 kg)     Lab Results  Component Value Date   WBC 9.6 04/07/2014   HGB 12.6 04/07/2014   HCT 38.6 04/07/2014   PLT 371.0 04/07/2014   GLUCOSE 77 04/07/2014   CHOL 163 07/17/2011   TRIG 140.0 07/17/2011   HDL 81.90 07/17/2011   LDLCALC 53 07/17/2011   ALT 7 04/07/2014   AST 13 04/07/2014   NA  138 04/07/2014   K 3.9 04/07/2014   CL 105 04/07/2014   CREATININE 0.9 04/07/2014   BUN 14 04/07/2014   CO2 26 04/07/2014   TSH 2.07 04/07/2014    Lab Results  Component Value Date   TSH 2.07 04/07/2014   Lab Results  Component Value Date   WBC 9.6 04/07/2014   HGB 12.6 04/07/2014   HCT 38.6 04/07/2014   MCV 89.5 04/07/2014   PLT 371.0 04/07/2014   Lab Results  Component Value Date   NA 138 04/07/2014   K 3.9 04/07/2014   CO2 26 04/07/2014   GLUCOSE 77 04/07/2014   BUN 14 04/07/2014   CREATININE 0.9 04/07/2014   BILITOT 0.5 04/07/2014   ALKPHOS 71 04/07/2014   AST 13 04/07/2014   ALT 7 04/07/2014   PROT 7.7 04/07/2014   ALBUMIN 3.4* 04/07/2014   ALBUMIN 3.4* 04/07/2014   CALCIUM 9.4 04/07/2014   GFR 80.38 04/07/2014   Lab Results  Component Value Date   CHOL 163 07/17/2011   Lab Results  Component Value Date   HDL 81.90 07/17/2011   Lab Results  Component Value Date   LDLCALC 53 07/17/2011   Lab Results  Component Value Date   TRIG 140.0 07/17/2011   Lab Results  Component Value Date   CHOLHDL 2 07/17/2011   No results found for: HGBA1C     Assessment & Plan:   Problem List Items Addressed This Visit    Anxiety  and depression    Patient in today continues to struggle with family stressors, life stressors and is very sad over her possible dismissal from the practice. She began to miss appointments regularly and failed a drug test so she was dismissed from the practice. After discussion today we will attempt to keep her in the practice but she is aware she will not get any further controlled substances from the practice. She is agreeable. For now we will decrease her seroquel to 200 mg to see if that helps her somnolence and we will use Venlafaxine to 75 mg po tid. She will call with any concerns. She is referred to Psychiatry and to a counselor to help her move forward and return her in 1-2 months for reevaluation      Insomnia    Encouraged good sleep hygiene such as dark, quiet room. No blue/green glowing lights such as computer screens in bedroom. No alcohol or stimulants in evening. Cut down on caffeine as able. Regular exercise is helpful but not just prior to bed time.       Migraine    Encouraged increased hydration, 64 ounces of clear fluids daily. Minimize alcohol and caffeine. Eat small frequent meals with lean proteins and complex carbs. Avoid high and low blood sugars. Get adequate sleep, 7-8 hours a night. Needs exercise daily preferably in the morning. Will no longer receive controlled substances from this office      Relevant Medications   venlafaxine XR (EFFEXOR XR) 75 MG 24 hr capsule   methocarbamol (ROBAXIN) 500 MG tablet   metoprolol (LOPRESSOR) 50 MG tablet   Tachycardia    Improved on recheck continue metoprolol       Other Visit Diagnoses    Encounter for immunization    -  Primary    Depression        Relevant Medications    venlafaxine XR (EFFEXOR XR) 75 MG 24 hr capsule    Other Relevant Orders    Ambulatory referral to Psychology  Ambulatory referral to Psychiatry       I have discontinued Ms. Ehmann's venlafaxine XR, BEYAZ, QUEtiapine, diazepam,  HYDROcodone-acetaminophen, and promethazine. I am also having her start on venlafaxine XR and QUEtiapine. Additionally, I am having her maintain her triamcinolone cream, albuterol, methocarbamol, and metoprolol.  Meds ordered this encounter  Medications  . venlafaxine XR (EFFEXOR XR) 75 MG 24 hr capsule    Sig: Take 3 capsules (225 mg total) by mouth daily with breakfast.    Dispense:  90 capsule    Refill:  2  . methocarbamol (ROBAXIN) 500 MG tablet    Sig: TAKE 1 TABLET BY MOUTH EVERY 8 HOURS AS NEEDED FOR MUSCLE SPASMS    Dispense:  60 tablet    Refill:  1  . QUEtiapine (SEROQUEL) 200 MG tablet    Sig: Take 1 tablet (200 mg total) by mouth at bedtime.    Dispense:  30 tablet    Refill:  2  . metoprolol (LOPRESSOR) 50 MG tablet    Sig: TAKE 1 TABLET(50 MG) BY MOUTH THREE TIMES DAILY    Dispense:  90 tablet    Refill:  5     Penni Homans, MD

## 2015-05-21 NOTE — Assessment & Plan Note (Signed)
Encouraged increased hydration, 64 ounces of clear fluids daily. Minimize alcohol and caffeine. Eat small frequent meals with lean proteins and complex carbs. Avoid high and low blood sugars. Get adequate sleep, 7-8 hours a night. Needs exercise daily preferably in the morning. Will no longer receive controlled substances from this office

## 2015-05-22 NOTE — Telephone Encounter (Signed)
error 

## 2015-06-18 ENCOUNTER — Other Ambulatory Visit: Payer: Self-pay | Admitting: Family Medicine

## 2015-07-04 ENCOUNTER — Ambulatory Visit: Payer: BLUE CROSS/BLUE SHIELD | Admitting: Family Medicine

## 2015-07-12 ENCOUNTER — Encounter: Payer: Self-pay | Admitting: Family Medicine

## 2015-07-12 ENCOUNTER — Ambulatory Visit (INDEPENDENT_AMBULATORY_CARE_PROVIDER_SITE_OTHER): Payer: BLUE CROSS/BLUE SHIELD | Admitting: Family Medicine

## 2015-07-12 VITALS — BP 100/64 | HR 87 | Temp 98.3°F | Ht 64.0 in | Wt 178.0 lb

## 2015-07-12 DIAGNOSIS — F329 Major depressive disorder, single episode, unspecified: Secondary | ICD-10-CM

## 2015-07-12 DIAGNOSIS — F32A Depression, unspecified: Secondary | ICD-10-CM

## 2015-07-12 DIAGNOSIS — M545 Low back pain: Secondary | ICD-10-CM

## 2015-07-12 DIAGNOSIS — F419 Anxiety disorder, unspecified: Secondary | ICD-10-CM

## 2015-07-12 DIAGNOSIS — G4700 Insomnia, unspecified: Secondary | ICD-10-CM | POA: Diagnosis not present

## 2015-07-12 DIAGNOSIS — E663 Overweight: Secondary | ICD-10-CM

## 2015-07-12 DIAGNOSIS — F418 Other specified anxiety disorders: Secondary | ICD-10-CM | POA: Diagnosis not present

## 2015-07-12 DIAGNOSIS — R300 Dysuria: Secondary | ICD-10-CM

## 2015-07-12 DIAGNOSIS — R000 Tachycardia, unspecified: Secondary | ICD-10-CM

## 2015-07-12 NOTE — Patient Instructions (Signed)

## 2015-07-12 NOTE — Progress Notes (Signed)
Pre visit review using our clinic review tool, if applicable. No additional management support is needed unless otherwise documented below in the visit note. 

## 2015-07-16 ENCOUNTER — Encounter: Payer: Self-pay | Admitting: Family Medicine

## 2015-07-16 NOTE — Assessment & Plan Note (Signed)
Continues to struggle with depression and a difficult family situation, she believes her meds are helping but she struggles. Will proceed with behavioral health referral.

## 2015-07-16 NOTE — Assessment & Plan Note (Signed)
RRR today 

## 2015-07-16 NOTE — Assessment & Plan Note (Signed)
Encouraged DASH diet, decrease po intake and increase exercise as tolerated. Needs 7-8 hours of sleep nightly. Avoid trans fats, eat small, frequent meals every 4-5 hours with lean proteins, complex carbs and healthy fats. Minimize simple carbs 

## 2015-07-16 NOTE — Assessment & Plan Note (Signed)
Encouraged good sleep hygiene such as dark, quiet room. No blue/green glowing lights such as computer screens in bedroom. No alcohol or stimulants in evening. Cut down on caffeine as able. Regular exercise is helpful but not just prior to bed time.  

## 2015-07-16 NOTE — Progress Notes (Signed)
Patient ID: Kathryn Randall, female   DOB: 10-28-1990, 25 y.o.   MRN: [id number]   Subjective:    Patient ID: Kathryn Randall, female    DOB: 06/14/1990, 25 y.o.   MRN: [id number]  No chief complaint on file.   HPI Patient is in today for follow up. Continues to struggle with stress especially with her family. She endorses anhedonia but denies suicidal ideation. She also continues to endorse back and neck pain. Worse back pain with menses. She is also noting some mild dysuria. No hematuria, flank pain, urinary frequency or urgency. Denies CP/palp/SOB/HA/congestion/fevers/GI c/o. Taking meds as prescribed  Past Medical History  Diagnosis Date  . Anxiety   . Depression   . Back pain   . Migraine 06/28/2011  . Allergic state 06/28/2011  . Preventative health care 06/28/2011  . Anxiety and depression 07/17/2011  . UTI (lower urinary tract infection) 08/18/2011  . Tachycardia 11/14/2011  . Anemia 11/14/2011  . Hypokalemia 11/14/2011  . Overweight(278.02) 04/27/2012  . Dysmenorrhea 05/26/2012  . Neck strain 06/28/2012  . Insomnia 02/21/2013  . Cervical cancer screening 05/26/2012    Past Surgical History  Procedure Laterality Date  . Wisdom tooth extraction  25 yrs old    Family History  Problem Relation Age of Onset  . Migraines Mother   . Arthritis Mother   . Hearing loss Mother   . Menstrual problems Mother   . Migraines Father   . Obesity Maternal Grandmother   . Heart disease Maternal Grandmother     CHF  . Hypertension Maternal Grandmother   . Hearing loss Maternal Grandmother   . Arthritis Maternal Grandmother   . Menstrual problems Maternal Grandmother   . Diabetes Maternal Grandfather     type 2  . Obesity Maternal Grandfather   . Leukemia Paternal Grandfather   . Migraines Paternal Grandfather   . Cancer Paternal Grandfather     leukemia  . Ovarian cysts Paternal Aunt     Social History   Social History  . Marital Status: Single    Spouse Name: N/A  . Number  of Children: N/A  . Years of Education: N/A   Occupational History  . Not on file.   Social History Main Topics  . Smoking status: Never Smoker   . Smokeless tobacco: Never Used     Comment: black and milds every once in awhile  . Alcohol Use: No  . Drug Use: 7.00 per week    Special: Hydrocodone  . Sexual Activity:    Partners: Male   Other Topics Concern  . Not on file   Social History Narrative    Outpatient Prescriptions Prior to Visit  Medication Sig Dispense Refill  . albuterol (PROVENTIL HFA;VENTOLIN HFA) 108 (90 BASE) MCG/ACT inhaler Inhale 2 puffs into the lungs every 6 (six) hours as needed for wheezing or shortness of breath. 1 Inhaler 2  . methocarbamol (ROBAXIN) 500 MG tablet TAKE 1 TABLET BY MOUTH EVERY 8 HOURS AS NEEDED FOR MUSCLE SPASMS 60 tablet 1  . metoprolol (LOPRESSOR) 50 MG tablet TAKE 1 TABLET(50 MG) BY MOUTH THREE TIMES DAILY 90 tablet 5  . QUEtiapine (SEROQUEL) 200 MG tablet Take 1 tablet (200 mg total) by mouth at bedtime. 30 tablet 2  . RAJANI 3-0.02-0.451 MG tablet TAKE AS INSTRUCTED BY YOUR PRESCRIBER 84 tablet 1  . triamcinolone cream (KENALOG) 0.1 % Apply 1 application topically 2 (two) times daily. 85.2 g 3  . venlafaxine XR (EFFEXOR XR) 75 MG 24  hr capsule Take 3 capsules (225 mg total) by mouth daily with breakfast. 90 capsule 2   No facility-administered medications prior to visit.    Allergies  Allergen Reactions  . Ibuprofen     Racing heart  . Morphine And Related Itching    Review of Systems  Constitutional: Positive for malaise/fatigue. Negative for fever.  HENT: Negative for congestion.   Eyes: Negative for discharge.  Respiratory: Negative for shortness of breath.   Cardiovascular: Negative for chest pain, palpitations and leg swelling.  Gastrointestinal: Negative for nausea and abdominal pain.  Genitourinary: Positive for dysuria.  Musculoskeletal: Positive for myalgias and back pain. Negative for falls.  Skin: Negative for  rash.  Neurological: Negative for loss of consciousness and headaches.  Endo/Heme/Allergies: Negative for environmental allergies.  Psychiatric/Behavioral: Positive for depression. Negative for suicidal ideas. The patient is nervous/anxious.        Objective:    Physical Exam  Constitutional: She is oriented to person, place, and time. She appears well-developed and well-nourished. No distress.  HENT:  Head: Normocephalic and atraumatic.  Nose: Nose normal.  Eyes: Right eye exhibits no discharge. Left eye exhibits no discharge.  Neck: Normal range of motion. Neck supple.  Cardiovascular: Normal rate and regular rhythm.   No murmur heard. Pulmonary/Chest: Effort normal and breath sounds normal.  Abdominal: Soft. Bowel sounds are normal. There is no tenderness.  Musculoskeletal: She exhibits no edema.  Neurological: She is alert and oriented to person, place, and time.  Skin: Skin is warm and dry.  Psychiatric: She has a normal mood and affect.  Nursing note and vitals reviewed.   BP 100/64 mmHg  Pulse 87  Temp(Src) 98.3 F (36.8 C) (Oral)  Ht 5\' 4"  (1.626 m)  Wt 178 lb (80.74 kg)  BMI 30.54 kg/m2  SpO2 97% Wt Readings from Last 3 Encounters:  07/12/15 178 lb (80.74 kg)  05/16/15 174 lb 8 oz (79.153 kg)  01/12/15 180 lb (81.647 kg)     Lab Results  Component Value Date   WBC 9.6 04/07/2014   HGB 12.6 04/07/2014   HCT 38.6 04/07/2014   PLT 371.0 04/07/2014   GLUCOSE 77 04/07/2014   CHOL 163 07/17/2011   TRIG 140.0 07/17/2011   HDL 81.90 07/17/2011   LDLCALC 53 07/17/2011   ALT 7 04/07/2014   AST 13 04/07/2014   NA 138 04/07/2014   K 3.9 04/07/2014   CL 105 04/07/2014   CREATININE 0.9 04/07/2014   BUN 14 04/07/2014   CO2 26 04/07/2014   TSH 2.07 04/07/2014    Lab Results  Component Value Date   TSH 2.07 04/07/2014   Lab Results  Component Value Date   WBC 9.6 04/07/2014   HGB 12.6 04/07/2014   HCT 38.6 04/07/2014   MCV 89.5 04/07/2014   PLT 371.0  04/07/2014   Lab Results  Component Value Date   NA 138 04/07/2014   K 3.9 04/07/2014   CO2 26 04/07/2014   GLUCOSE 77 04/07/2014   BUN 14 04/07/2014   CREATININE 0.9 04/07/2014   BILITOT 0.5 04/07/2014   ALKPHOS 71 04/07/2014   AST 13 04/07/2014   ALT 7 04/07/2014   PROT 7.7 04/07/2014   ALBUMIN 3.4* 04/07/2014   ALBUMIN 3.4* 04/07/2014   CALCIUM 9.4 04/07/2014   GFR 80.38 04/07/2014   Lab Results  Component Value Date   CHOL 163 07/17/2011   Lab Results  Component Value Date   HDL 81.90 07/17/2011   Lab Results  Component Value Date   LDLCALC 53 07/17/2011   Lab Results  Component Value Date   TRIG 140.0 07/17/2011   Lab Results  Component Value Date   CHOLHDL 2 07/17/2011   No results found for: HGBA1C     Assessment & Plan:   Problem List Items Addressed This Visit    Anxiety and depression    Continues to struggle with depression and a difficult family situation, she believes her meds are helping but she struggles. Will proceed with behavioral health referral.      Back pain    Patient no longer able to get narcotics in office. Offered referral to sports med but she declines today. Back pain is worst with menses, encouraged to start Advil use earlier.      Insomnia    Encouraged good sleep hygiene such as dark, quiet room. No blue/green glowing lights such as computer screens in bedroom. No alcohol or stimulants in evening. Cut down on caffeine as able. Regular exercise is helpful but not just prior to bed time.       Overweight    Encouraged DASH diet, decrease po intake and increase exercise as tolerated. Needs 7-8 hours of sleep nightly. Avoid trans fats, eat small, frequent meals every 4-5 hours with lean proteins, complex carbs and healthy fats. Minimize simple carbs      Tachycardia    RRR today       Other Visit Diagnoses    Dysuria    -  Primary    Relevant Orders    Urinalysis    Urine culture       I am having Ms. Albany  maintain her triamcinolone cream, albuterol, venlafaxine XR, methocarbamol, QUEtiapine, metoprolol, and RAJANI.  No orders of the defined types were placed in this encounter.     Penni Homans, MD

## 2015-07-16 NOTE — Assessment & Plan Note (Signed)
Patient no longer able to get narcotics in office. Offered referral to sports med but she declines today. Back pain is worst with menses, encouraged to start Advil use earlier.

## 2015-08-27 ENCOUNTER — Other Ambulatory Visit: Payer: Self-pay | Admitting: Family Medicine

## 2015-09-07 ENCOUNTER — Other Ambulatory Visit: Payer: Self-pay | Admitting: Family Medicine

## 2015-09-07 ENCOUNTER — Ambulatory Visit: Payer: BLUE CROSS/BLUE SHIELD | Admitting: Family Medicine

## 2015-09-07 MED ORDER — VENLAFAXINE HCL ER 75 MG PO CP24: ORAL_CAPSULE | ORAL | Status: DC

## 2015-09-11 ENCOUNTER — Ambulatory Visit (INDEPENDENT_AMBULATORY_CARE_PROVIDER_SITE_OTHER): Payer: BLUE CROSS/BLUE SHIELD | Admitting: Family

## 2015-09-11 ENCOUNTER — Encounter: Payer: Self-pay | Admitting: Family

## 2015-09-11 VITALS — BP 126/82 | HR 124 | Temp 98.2°F | Resp 16 | Ht 64.0 in | Wt 179.2 lb

## 2015-09-11 DIAGNOSIS — K047 Periapical abscess without sinus: Secondary | ICD-10-CM

## 2015-09-11 MED ORDER — HYDROCODONE-ACETAMINOPHEN 5-325 MG PO TABS: 1.0000 | ORAL_TABLET | Freq: Four times a day (QID) | ORAL | Status: DC | PRN

## 2015-09-11 MED ORDER — AMOXICILLIN-POT CLAVULANATE 875-125 MG PO TABS: 1.0000 | ORAL_TABLET | Freq: Two times a day (BID) | ORAL | Status: DC

## 2015-09-11 MED FILL — HYDROCODON-APAP 5-325: 5-325 | 3 days supply | Qty: 15 | Fill #0

## 2015-09-11 MED FILL — AMOX-CLAV 875-125 MG TABLET: 875-125 | 10 days supply | Qty: 20 | Fill #0

## 2015-09-11 NOTE — Progress Notes (Signed)
Subjective:    Patient ID: Kathryn Randall, female    DOB: [birth year], 25 y.o.   MRN: [id number]  HPI  Kathryn Randall is a 25 yr old female who presents today with chief complaint of facial swelling in the left cheek. This began last night.  She reports that she has dental insurance but only obtained dental insurance recently.  She has not had regular dental care in the last few years.  Reports pain is coming from a broken left upper molar.   Review of Systems See HPI  Past Medical History  Diagnosis Date  . Anxiety   . Depression   . Back pain   . Migraine 06/28/2011  . Allergic state 06/28/2011  . Preventative health care 06/28/2011  . Anxiety and depression 07/17/2011  . UTI (lower urinary tract infection) 08/18/2011  . Tachycardia 11/14/2011  . Anemia 11/14/2011  . Hypokalemia 11/14/2011  . Overweight(278.02) 04/27/2012  . Dysmenorrhea 05/26/2012  . Neck strain 06/28/2012  . Insomnia 02/21/2013  . Cervical cancer screening 05/26/2012    Social History   Social History  . Marital Status: Single    Spouse Name: N/A  . Number of Children: N/A  . Years of Education: N/A   Occupational History  . Not on file.   Social History Main Topics  . Smoking status: Never Smoker   . Smokeless tobacco: Never Used     Comment: black and milds every once in awhile  . Alcohol Use: No  . Drug Use: 7.00 per week    Special: Hydrocodone  . Sexual Activity:    Partners: Male   Other Topics Concern  . Not on file   Social History Narrative    Past Surgical History  Procedure Laterality Date  . Wisdom tooth extraction  25 yrs old    Family History  Problem Relation Age of Onset  . Migraines Mother   . Arthritis Mother   . Hearing loss Mother   . Menstrual problems Mother   . Migraines Father   . Obesity Maternal Grandmother   . Heart disease Maternal Grandmother     CHF  . Hypertension Maternal Grandmother   . Hearing loss Maternal Grandmother   . Arthritis Maternal  Grandmother   . Menstrual problems Maternal Grandmother   . Diabetes Maternal Grandfather     type 2  . Obesity Maternal Grandfather   . Leukemia Paternal Grandfather   . Migraines Paternal Grandfather   . Cancer Paternal Grandfather     leukemia  . Ovarian cysts Paternal Aunt     Allergies  Allergen Reactions  . Ibuprofen     Racing heart  . Morphine And Related Itching    Current Outpatient Prescriptions on File Prior to Visit  Medication Sig Dispense Refill  . albuterol (PROVENTIL HFA;VENTOLIN HFA) 108 (90 BASE) MCG/ACT inhaler Inhale 2 puffs into the lungs every 6 (six) hours as needed for wheezing or shortness of breath. 1 Inhaler 2  . metoprolol (LOPRESSOR) 50 MG tablet TAKE 1 TABLET(50 MG) BY MOUTH THREE TIMES DAILY 90 tablet 5  . QUEtiapine (SEROQUEL) 200 MG tablet Take 1 tablet (200 mg total) by mouth at bedtime. 30 tablet 2  . venlafaxine XR (EFFEXOR-XR) 75 MG 24 hr capsule TAKE 3 CAPSULES BY MOUTH DAILY WITH BREAKFAST 90 capsule 0   No current facility-administered medications on file prior to visit.    BP 126/82 mmHg  Pulse 124  Temp(Src) 98.2 F (36.8 C) (Oral)  Resp 16  Ht 5\' 4"  (1.626 m)  Wt 179 lb 3.2 oz (81.285 kg)  BMI 30.74 kg/m2  SpO2 98%  LMP 08/14/2015       Objective:   Physical Exam  Constitutional: She is oriented to person, place, and time. She appears well-developed and well-nourished.  HENT:  Head: Normocephalic and atraumatic.  Left cheek is swollen. Poor dentition is noted.   Multiple molars are broken including left upper molar.   Cardiovascular: Regular rhythm and normal heart sounds.  Tachycardia present.   No murmur heard. Pulmonary/Chest: Effort normal and breath sounds normal. No respiratory distress. She has no wheezes.  Neurological: She is alert and oriented to person, place, and time.  Skin: Skin is warm and dry.  Psychiatric: She has a normal mood and affect. Her behavior is normal. Judgment and thought content normal.            Assessment & Plan:  Dental Abscess- Will rx with augmentin.  She requests something for pain. Pt has tolerated hydrocodone in the past. Rx provided for hydrocodone for acute dental pain. She is advised to schedule a dental appointment asap and to call us if symptoms worsen or do not improve.  HR is up today, likely related to infection and skipping dose of metoprolol.

## 2015-09-11 NOTE — Progress Notes (Signed)
Pre visit review using our clinic review tool, if applicable. No additional management support is needed unless otherwise documented below in the visit note. 

## 2015-09-11 NOTE — Patient Instructions (Signed)
Start augmentin for your dental abscess. Please schedule an appointment with dental this week. Call if increased pain/swelling, if you develop fever >101 or if you are unable to get in with a dentist in the next 2-3 days. You may use hydrocodone as needed for pain- don't drive after taking due to possible drowsiness.

## 2015-09-14 ENCOUNTER — Other Ambulatory Visit: Payer: Self-pay | Admitting: Family Medicine

## 2015-09-14 NOTE — Telephone Encounter (Signed)
Relation to [id number] Call back number:[phone]   Reason for call:  Patient requesting a few pills of HYDROcodone-acetaminophen (NORCO/VICODIN) 5-325 MG tablet. Patient states she expressed to her dentist how much pain she was in and they scheduled her for Tuesday 09/19/15. Patient states level pain is 10 and the pills knock the edge off. . Please advise

## 2015-09-14 NOTE — Telephone Encounter (Signed)
This patient was just given #15 by Debbrah Alar on 09/11/2015 Advise on refill

## 2015-09-14 NOTE — Telephone Encounter (Signed)
She failed her drug test. She can have Tramadol 50 mg tabs 1 tab po tid prn seveer pain, #30 til her dental work is done.

## 2015-09-15 MED ORDER — TRAMADOL HCL 50 MG PO TABS: 50.0000 mg | ORAL_TABLET | Freq: Three times a day (TID) | ORAL | Status: DC | PRN

## 2015-09-15 NOTE — Telephone Encounter (Signed)
Printed tramadol and faxed to [facility]. Called the patient informed of PCP instructions regarding refill/failed drug test.

## 2015-10-02 ENCOUNTER — Ambulatory Visit: Payer: BLUE CROSS/BLUE SHIELD | Admitting: Family Medicine

## 2015-10-02 ENCOUNTER — Telehealth: Payer: Self-pay | Admitting: Family Medicine

## 2015-10-02 DIAGNOSIS — Z0289 Encounter for other administrative examinations: Secondary | ICD-10-CM
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
## 2015-10-02 NOTE — Telephone Encounter (Signed)
Cannot refill her Tramadol but can refill the Seroquel for 30 days supply til seen

## 2015-10-02 NOTE — Telephone Encounter (Signed)
Relation to [id number] Call back number:[phone] Pharmacy: [id number] - [place], [place] [phone] (Phone) [phone] (Fax)        Reason for call:  Patient requesting a refill QUEtiapine (SEROQUEL) 200 MG tablet and traMADol (ULTRAM) 50 MG tablet (due to mouth pain)

## 2015-10-02 NOTE — Telephone Encounter (Signed)
chargeortiz

## 2015-10-02 NOTE — Telephone Encounter (Signed)
Last Tramadol refill was 09/15/2015 #30 with 0 refills Seroquel  #30 with 2 refills on 05/16/2015 Last Office visit 07/12/2015 NO SHOW TODAY Contract signed on 04/07/2014 HIGH RISK PER DR BLYTH- UDS--OVER DUE

## 2015-10-03 ENCOUNTER — Encounter: Payer: Self-pay | Admitting: Family Medicine

## 2015-10-03 MED ORDER — QUETIAPINE FUMARATE 200 MG PO TABS: 200.0000 mg | ORAL_TABLET | Freq: Every day | ORAL | Status: DC

## 2015-10-03 NOTE — Telephone Encounter (Signed)
Called the patient left a detailed message seroquel sent in to [facility].  Also informed Tramadol refill denied until seen in the office by PCP.

## 2015-10-03 NOTE — Telephone Encounter (Signed)
Marked to charge and mailing no show letter °

## 2015-10-23 ENCOUNTER — Other Ambulatory Visit: Payer: Self-pay | Admitting: Family Medicine

## 2015-11-09 ENCOUNTER — Telehealth: Payer: Self-pay | Admitting: Family Medicine

## 2015-11-09 ENCOUNTER — Ambulatory Visit: Payer: BLUE CROSS/BLUE SHIELD | Admitting: Family Medicine

## 2015-11-09 NOTE — Telephone Encounter (Signed)
charge 

## 2015-11-09 NOTE — Telephone Encounter (Signed)
Pt no show today for 3:45pm and called at 3:45pm. She states her boyfriend was supposed to bring her and called just a few minutes ago saying he can't leave work to bring her to appt. Pt rescheduled for 11/21/15. Charge or no charge?

## 2015-11-10 ENCOUNTER — Encounter: Payer: Self-pay | Admitting: Family Medicine

## 2015-11-10 NOTE — Telephone Encounter (Signed)
Marked to charge and mailing no show letter °

## 2015-11-21 ENCOUNTER — Ambulatory Visit (INDEPENDENT_AMBULATORY_CARE_PROVIDER_SITE_OTHER): Payer: BLUE CROSS/BLUE SHIELD | Admitting: Family Medicine

## 2015-11-21 ENCOUNTER — Encounter: Payer: Self-pay | Admitting: Family Medicine

## 2015-11-21 VITALS — BP 104/60 | HR 67 | Temp 98.6°F | Ht 64.0 in | Wt 175.0 lb

## 2015-11-21 DIAGNOSIS — E663 Overweight: Secondary | ICD-10-CM

## 2015-11-21 DIAGNOSIS — R000 Tachycardia, unspecified: Secondary | ICD-10-CM | POA: Diagnosis not present

## 2015-11-21 DIAGNOSIS — F419 Anxiety disorder, unspecified: Secondary | ICD-10-CM

## 2015-11-21 DIAGNOSIS — F418 Other specified anxiety disorders: Secondary | ICD-10-CM | POA: Diagnosis not present

## 2015-11-21 DIAGNOSIS — F329 Major depressive disorder, single episode, unspecified: Secondary | ICD-10-CM

## 2015-11-21 MED ORDER — VENLAFAXINE HCL ER 75 MG PO CP24: ORAL_CAPSULE | ORAL | Status: DC

## 2015-11-21 MED ORDER — METOPROLOL SUCCINATE ER 25 MG PO TB24: 25.0000 mg | ORAL_TABLET | Freq: Every day | ORAL | Status: DC

## 2015-11-21 MED ORDER — TRAMADOL HCL 50 MG PO TABS: 50.0000 mg | ORAL_TABLET | Freq: Three times a day (TID) | ORAL | Status: DC | PRN

## 2015-11-21 MED FILL — traMADol HCL 50 MG TABS: 50 | 13 days supply | Qty: 40 | Fill #0

## 2015-11-21 NOTE — Patient Instructions (Signed)

## 2015-11-21 NOTE — Progress Notes (Signed)
Pre visit review using our clinic review tool, if applicable. No additional management support is needed unless otherwise documented below in the visit note. 

## 2015-12-03 NOTE — Progress Notes (Signed)
Patient ID: Kathryn Randall, female   DOB: 1991-05-10, 25 y.o.   MRN: [id number]   Subjective:    Patient ID: Kathryn Randall, female    DOB: [birth year], 25 y.o.   MRN: [id number]  Chief Complaint  Patient presents with  . Follow-up    Medication refills    HPI Patient is in today for follow up. She is doing better today. No recent illness, she is working full time at [employer] and is enjoying this work. No new concerns. Back pain continues but is no worse. Denies CP/palp/SOB/HA/congestion/fevers/GI or GU c/o. Taking meds as prescribed  Past Medical History  Diagnosis Date  . Anxiety   . Depression   . Back pain   . Migraine 06/28/2011  . Allergic state 06/28/2011  . Preventative health care 06/28/2011  . Anxiety and depression 07/17/2011  . UTI (lower urinary tract infection) 08/18/2011  . Tachycardia 11/14/2011  . Anemia 11/14/2011  . Hypokalemia 11/14/2011  . Overweight(278.02) 04/27/2012  . Dysmenorrhea 05/26/2012  . Neck strain 06/28/2012  . Insomnia 02/21/2013  . Cervical cancer screening 05/26/2012    Past Surgical History  Procedure Laterality Date  . Wisdom tooth extraction  25 yrs old    Family History  Problem Relation Age of Onset  . Migraines Mother   . Arthritis Mother   . Hearing loss Mother   . Menstrual problems Mother   . Migraines Father   . Obesity Maternal Grandmother   . Heart disease Maternal Grandmother     CHF  . Hypertension Maternal Grandmother   . Hearing loss Maternal Grandmother   . Arthritis Maternal Grandmother   . Menstrual problems Maternal Grandmother   . Diabetes Maternal Grandfather     type 2  . Obesity Maternal Grandfather   . Leukemia Paternal Grandfather   . Migraines Paternal Grandfather   . Cancer Paternal Grandfather     leukemia  . Ovarian cysts Paternal Aunt     Social History   Social History  . Marital Status: Single    Spouse Name: N/A  . Number of Children: N/A  . Years of Education: N/A   Occupational  History  . Not on file.   Social History Main Topics  . Smoking status: Never Smoker   . Smokeless tobacco: Never Used     Comment: black and milds every once in awhile  . Alcohol Use: No  . Drug Use: 7.00 per week    Special: Hydrocodone  . Sexual Activity:    Partners: Male   Other Topics Concern  . Not on file   Social History Narrative    Outpatient Prescriptions Prior to Visit  Medication Sig Dispense Refill  . metoprolol (LOPRESSOR) 50 MG tablet TAKE 1 TABLET(50 MG) BY MOUTH THREE TIMES DAILY 90 tablet 5  . amoxicillin-clavulanate (AUGMENTIN) 875-125 MG tablet Take 1 tablet by mouth 2 (two) times daily. 20 tablet 0  . HYDROcodone-acetaminophen (NORCO/VICODIN) 5-325 MG tablet Take 1 tablet by mouth every 6 (six) hours as needed for moderate pain. 15 tablet 0  . traMADol (ULTRAM) 50 MG tablet Take 1 tablet (50 mg total) by mouth 3 (three) times daily as needed. 30 tablet 0  . venlafaxine XR (EFFEXOR-XR) 75 MG 24 hr capsule TAKE 3 CAPSULES BY MOUTH DAILY WITH BREAKFAST 90 capsule 0  . venlafaxine XR (EFFEXOR-XR) 75 MG 24 hr capsule TAKE 3 CAPSULES BY MOUTH DAILY WITH BREAKFAST 90 capsule 0  . albuterol (PROVENTIL HFA;VENTOLIN HFA) 108 (90 BASE) MCG/ACT  inhaler Inhale 2 puffs into the lungs every 6 (six) hours as needed for wheezing or shortness of breath. (Patient not taking: Reported on 11/21/2015) 1 Inhaler 2  . QUEtiapine (SEROQUEL) 200 MG tablet Take 1 tablet (200 mg total) by mouth at bedtime. (Patient not taking: Reported on 11/21/2015) 30 tablet 0   No facility-administered medications prior to visit.    Allergies  Allergen Reactions  . Ibuprofen     Racing heart  . Morphine And Related Itching    Review of Systems  Constitutional: Negative for fever and malaise/fatigue.  HENT: Negative for congestion.   Eyes: Negative for blurred vision.  Respiratory: Negative for shortness of breath.   Cardiovascular: Negative for chest pain, palpitations and leg swelling.    Gastrointestinal: Negative for nausea, abdominal pain and blood in stool.  Genitourinary: Negative for dysuria and frequency.  Musculoskeletal: Positive for back pain. Negative for falls.  Skin: Negative for rash.  Neurological: Negative for dizziness, loss of consciousness and headaches.  Endo/Heme/Allergies: Negative for environmental allergies.  Psychiatric/Behavioral: Negative for depression. The patient is not nervous/anxious.        Objective:    Physical Exam  Constitutional: She is oriented to person, place, and time. She appears well-developed and well-nourished. No distress.  HENT:  Head: Normocephalic and atraumatic.  Nose: Nose normal.  Eyes: Right eye exhibits no discharge. Left eye exhibits no discharge.  Neck: Normal range of motion. Neck supple.  Cardiovascular: Normal rate and regular rhythm.   No murmur heard. Pulmonary/Chest: Effort normal and breath sounds normal.  Abdominal: Soft. Bowel sounds are normal. There is no tenderness.  Musculoskeletal: She exhibits no edema.  Neurological: She is alert and oriented to person, place, and time.  Skin: Skin is warm and dry.  Psychiatric: She has a normal mood and affect.  Nursing note and vitals reviewed.   BP 104/60 mmHg  Pulse 67  Temp(Src) 98.6 F (37 C) (Oral)  Ht 5\' 4"  (1.626 m)  Wt 175 lb (79.379 kg)  BMI 30.02 kg/m2  SpO2 96% Wt Readings from Last 3 Encounters:  11/21/15 175 lb (79.379 kg)  09/11/15 179 lb 3.2 oz (81.285 kg)  07/12/15 178 lb (80.74 kg)     Lab Results  Component Value Date   WBC 9.6 04/07/2014   HGB 12.6 04/07/2014   HCT 38.6 04/07/2014   PLT 371.0 04/07/2014   GLUCOSE 77 04/07/2014   CHOL 163 07/17/2011   TRIG 140.0 07/17/2011   HDL 81.90 07/17/2011   LDLCALC 53 07/17/2011   ALT 7 04/07/2014   AST 13 04/07/2014   NA 138 04/07/2014   K 3.9 04/07/2014   CL 105 04/07/2014   CREATININE 0.9 04/07/2014   BUN 14 04/07/2014   CO2 26 04/07/2014   TSH 2.07 04/07/2014     Lab Results  Component Value Date   TSH 2.07 04/07/2014   Lab Results  Component Value Date   WBC 9.6 04/07/2014   HGB 12.6 04/07/2014   HCT 38.6 04/07/2014   MCV 89.5 04/07/2014   PLT 371.0 04/07/2014   Lab Results  Component Value Date   NA 138 04/07/2014   K 3.9 04/07/2014   CO2 26 04/07/2014   GLUCOSE 77 04/07/2014   BUN 14 04/07/2014   CREATININE 0.9 04/07/2014   BILITOT 0.5 04/07/2014   ALKPHOS 71 04/07/2014   AST 13 04/07/2014   ALT 7 04/07/2014   PROT 7.7 04/07/2014   ALBUMIN 3.4* 04/07/2014   ALBUMIN 3.4* 04/07/2014  CALCIUM 9.4 04/07/2014   GFR 80.38 04/07/2014   Lab Results  Component Value Date   CHOL 163 07/17/2011   Lab Results  Component Value Date   HDL 81.90 07/17/2011   Lab Results  Component Value Date   LDLCALC 53 07/17/2011   Lab Results  Component Value Date   TRIG 140.0 07/17/2011   Lab Results  Component Value Date   CHOLHDL 2 07/17/2011   No results found for: HGBA1C     Assessment & Plan:   Problem List Items Addressed This Visit    Tachycardia - Primary    RRR today      Overweight    Encouraged DASH diet, decrease po intake and increase exercise as tolerated. Needs 7-8 hours of sleep nightly. Avoid trans fats, eat small, frequent meals every 4-5 hours with lean proteins, complex carbs and healthy fats. Minimize simple carbs      Anxiety and depression    Doing better on recent meds. Is working full time at a family [employer] and is doing well.          I have discontinued Ms. Agresta's amoxicillin-clavulanate and HYDROcodone-acetaminophen. I am also having her start on metoprolol succinate. Additionally, I am having her maintain her albuterol, metoprolol, QUEtiapine, venlafaxine XR, and traMADol.  Meds ordered this encounter  Medications  . venlafaxine XR (EFFEXOR-XR) 75 MG 24 hr capsule    Sig: TAKE 3 CAPSULES BY MOUTH DAILY WITH BREAKFAST    Dispense:  90 capsule    Refill:  5  . traMADol (ULTRAM) 50  MG tablet    Sig: Take 1 tablet (50 mg total) by mouth 3 (three) times daily as needed.    Dispense:  40 tablet    Refill:  0  . metoprolol succinate (TOPROL-XL) 25 MG 24 hr tablet    Sig: Take 1 tablet (25 mg total) by mouth daily.    Dispense:  30 tablet    Refill:  5     Penni Homans, MD

## 2015-12-03 NOTE — Assessment & Plan Note (Signed)
Encouraged DASH diet, decrease po intake and increase exercise as tolerated. Needs 7-8 hours of sleep nightly. Avoid trans fats, eat small, frequent meals every 4-5 hours with lean proteins, complex carbs and healthy fats. Minimize simple carbs 

## 2015-12-03 NOTE — Assessment & Plan Note (Signed)
Doing better on recent meds. Is working full time at a family [employer] and is doing well.

## 2015-12-03 NOTE — Assessment & Plan Note (Signed)
RRR today 

## 2015-12-04 ENCOUNTER — Other Ambulatory Visit: Payer: Self-pay | Admitting: Family Medicine

## 2015-12-20 ENCOUNTER — Other Ambulatory Visit: Payer: Self-pay | Admitting: Family Medicine

## 2015-12-20 NOTE — Telephone Encounter (Signed)
Pt is requesting refill on Tramadol. Per FYI note on 06/20/2015 no further controlled substance from our office. Please advise.

## 2015-12-20 NOTE — Telephone Encounter (Signed)
I did agree to tramadol for her. Please refill

## 2015-12-20 NOTE — Telephone Encounter (Signed)
Received refill request for Tramadol.  Last ov: 11/21/2015 Last refill: 11/21/2015  Is it ok to refill? Please advise. Thanks

## 2015-12-21 MED ORDER — TRAMADOL HCL 50 MG PO TABS: 50.0000 mg | ORAL_TABLET | Freq: Three times a day (TID) | ORAL | Status: DC | PRN

## 2015-12-21 NOTE — Telephone Encounter (Signed)
Faxed hardcopy for tramadol to [facility].

## 2015-12-21 NOTE — Telephone Encounter (Signed)
Last refill/appointment on 11/21/2015

## 2016-01-19 ENCOUNTER — Other Ambulatory Visit: Payer: Self-pay | Admitting: Family Medicine

## 2016-01-19 NOTE — Telephone Encounter (Signed)
Last refill on 12/21/2015  #40 no refills Last office visit 11/21/2015 Contract 04/07/2014 BUT- in the [facility] states you no longer prescribed controlled medications for this patient ??

## 2016-01-19 NOTE — Telephone Encounter (Signed)
Faxed hardcopy [facility] [address] [place]

## 2016-01-23 ENCOUNTER — Ambulatory Visit (INDEPENDENT_AMBULATORY_CARE_PROVIDER_SITE_OTHER): Payer: BLUE CROSS/BLUE SHIELD | Admitting: Family Medicine

## 2016-01-23 ENCOUNTER — Encounter: Payer: Self-pay | Admitting: Family Medicine

## 2016-01-23 DIAGNOSIS — R000 Tachycardia, unspecified: Secondary | ICD-10-CM | POA: Diagnosis not present

## 2016-01-23 DIAGNOSIS — K047 Periapical abscess without sinus: Secondary | ICD-10-CM | POA: Diagnosis not present

## 2016-01-23 DIAGNOSIS — F418 Other specified anxiety disorders: Secondary | ICD-10-CM | POA: Diagnosis not present

## 2016-01-23 DIAGNOSIS — G4700 Insomnia, unspecified: Secondary | ICD-10-CM | POA: Diagnosis not present

## 2016-01-23 DIAGNOSIS — F329 Major depressive disorder, single episode, unspecified: Secondary | ICD-10-CM

## 2016-01-23 DIAGNOSIS — F419 Anxiety disorder, unspecified: Secondary | ICD-10-CM

## 2016-01-23 MED ORDER — TRAMADOL HCL 50 MG PO TABS: 50.0000 mg | ORAL_TABLET | Freq: Three times a day (TID) | ORAL | 0 refills | Status: DC | PRN

## 2016-01-23 MED ORDER — CLINDAMYCIN HCL 300 MG PO CAPS: 300.0000 mg | ORAL_CAPSULE | Freq: Three times a day (TID) | ORAL | 0 refills | Status: DC

## 2016-01-23 NOTE — Patient Instructions (Signed)
Dental Abscess A dental abscess is pus in or around a tooth. HOME CARE  Take medicines only as told by your dentist.  If you were prescribed antibiotic medicine, finish all of it even if you start to feel better.  Rinse your mouth (gargle) often with salt water.  Do not drive or use heavy machinery, like a lawn mower, while taking pain medicine.  Do not apply heat to the outside of your mouth.  Keep all follow-up visits as told by your dentist. This is important. GET HELP IF:  Your pain is worse, and medicine does not help. GET HELP RIGHT AWAY IF:  You have a fever or chills.  Your symptoms suddenly get worse.  You have a very bad headache.  You have problems breathing or swallowing.  You have trouble opening your mouth.  You have puffiness (swelling) in your neck or around your eye.   This information is not intended to replace advice given to you by your health care provider. Make sure you discuss any questions you have with your health care provider.   Document Released: 10/11/2014 Document Reviewed: 10/11/2014 Elsevier Interactive Patient Education 2016 Elsevier Inc.  

## 2016-01-23 NOTE — Progress Notes (Signed)
Pre visit review using our clinic review tool, if applicable. No additional management support is needed unless otherwise documented below in the visit note. 

## 2016-02-04 ENCOUNTER — Encounter: Payer: Self-pay | Admitting: Family Medicine

## 2016-02-04 DIAGNOSIS — K047 Periapical abscess without sinus: Secondary | ICD-10-CM | POA: Insufficient documentation

## 2016-02-04 HISTORY — DX: Periapical abscess without sinus: K04.7

## 2016-02-04 NOTE — Assessment & Plan Note (Signed)
Encouraged good sleep hygiene such as dark, quiet room. No blue/green glowing lights such as computer screens in bedroom. No alcohol or stimulants in evening. Cut down on caffeine as able. Regular exercise is helpful but not just prior to bed time.  

## 2016-02-04 NOTE — Assessment & Plan Note (Signed)
Started on Clindamycin and probiotics and she is to follow up with dentist for definitive treatments.

## 2016-02-04 NOTE — Progress Notes (Signed)
Patient ID: Kathryn Randall, female   DOB: 14-Mar-1991, 25 y.o.   MRN: [id number]   Subjective:    Patient ID: Kathryn Randall, female    DOB: 09/22/1990, 25 y.o.   MRN: [id number]  Chief Complaint  Patient presents with  . Follow-up    HPI Patient is in today for follow up. She continues to struggle with poor dentition and is planning to have a significant amount of work done but has had a flair in pain and infections. No fevers or chills but gum pain and swelling. She continues to struggle with chronic pain but is managing better as she has gotten more active with her new job. Sleep is still a struggle some days. Denies CP/palp/SOB/HA/congestion/fevers/GI or GU c/o. Taking meds as prescribed  Past Medical History:  Diagnosis Date  . Allergic state 06/28/2011  . Anemia 11/14/2011  . Anxiety   . Anxiety and depression 07/17/2011  . Back pain   . Cervical cancer screening 05/26/2012  . Dental infection 02/04/2016  . Depression   . Dysmenorrhea 05/26/2012  . Hypokalemia 11/14/2011  . Insomnia 02/21/2013  . Migraine 06/28/2011  . Neck strain 06/28/2012  . Overweight(278.02) 04/27/2012  . Preventative health care 06/28/2011  . Tachycardia 11/14/2011  . UTI (lower urinary tract infection) 08/18/2011    Past Surgical History:  Procedure Laterality Date  . WISDOM TOOTH EXTRACTION  25 yrs old    Family History  Problem Relation Age of Onset  . Migraines Mother   . Arthritis Mother   . Hearing loss Mother   . Menstrual problems Mother   . Migraines Father   . Obesity Maternal Grandmother   . Heart disease Maternal Grandmother     CHF  . Hypertension Maternal Grandmother   . Hearing loss Maternal Grandmother   . Arthritis Maternal Grandmother   . Menstrual problems Maternal Grandmother   . Diabetes Maternal Grandfather     type 2  . Obesity Maternal Grandfather   . Leukemia Paternal Grandfather   . Migraines Paternal Grandfather   . Cancer Paternal Grandfather     leukemia  .  Ovarian cysts Paternal Aunt     Social History   Social History  . Marital status: Single    Spouse name: N/A  . Number of children: N/A  . Years of education: N/A   Occupational History  . Not on file.   Social History Main Topics  . Smoking status: Never Smoker  . Smokeless tobacco: Never Used     Comment: black and milds every once in awhile  . Alcohol use No  . Drug use:     Frequency: 7.0 times per week    Types: Hydrocodone  . Sexual activity: Not Currently    Partners: Male   Other Topics Concern  . Not on file   Social History Narrative  . No narrative on file    Outpatient Medications Prior to Visit  Medication Sig Dispense Refill  . albuterol (PROVENTIL HFA;VENTOLIN HFA) 108 (90 BASE) MCG/ACT inhaler Inhale 2 puffs into the lungs every 6 (six) hours as needed for wheezing or shortness of breath. 1 Inhaler 2  . metoprolol (LOPRESSOR) 50 MG tablet TAKE 1 TABLET(50 MG) BY MOUTH THREE TIMES DAILY 90 tablet 5  . metoprolol succinate (TOPROL-XL) 25 MG 24 hr tablet Take 1 tablet (25 mg total) by mouth daily. 30 tablet 5  . QUEtiapine (SEROQUEL) 200 MG tablet Take 1 tablet (200 mg total) by mouth at bedtime. 30 tablet  0  . RAJANI 3-0.02-0.451 MG tablet TAKE AS INSTRUCTED BY YOUR PRESCRIBER 84 tablet 0  . venlafaxine XR (EFFEXOR-XR) 75 MG 24 hr capsule TAKE 3 CAPSULES BY MOUTH DAILY WITH BREAKFAST 90 capsule 5  . traMADol (ULTRAM) 50 MG tablet TAKE 1 TABLET 3 TIMES A DAY AS NEEDED 40 tablet 0   No facility-administered medications prior to visit.     Allergies  Allergen Reactions  . Ibuprofen     Racing heart  . Morphine And Related Itching    Review of Systems  Constitutional: Positive for malaise/fatigue. Negative for fever.  HENT: Negative for congestion.   Eyes: Negative for blurred vision.  Respiratory: Negative for shortness of breath.   Cardiovascular: Negative for chest pain, palpitations and leg swelling.  Gastrointestinal: Negative for abdominal  pain, blood in stool and nausea.  Genitourinary: Negative for dysuria and frequency.  Musculoskeletal: Positive for back pain and myalgias. Negative for falls.  Skin: Negative for rash.  Neurological: Negative for dizziness, loss of consciousness and headaches.  Endo/Heme/Allergies: Negative for environmental allergies.  Psychiatric/Behavioral: Negative for depression. The patient is nervous/anxious and has insomnia.        Objective:    Physical Exam  Constitutional: She is oriented to person, place, and time. She appears well-developed and well-nourished. No distress.  HENT:  Head: Normocephalic and atraumatic.  Nose: Nose normal.  Poor dentition  Eyes: Right eye exhibits no discharge. Left eye exhibits no discharge.  Neck: Normal range of motion. Neck supple.  Cardiovascular: Normal rate and regular rhythm.   No murmur heard. Pulmonary/Chest: Effort normal and breath sounds normal.  Abdominal: Soft. Bowel sounds are normal. There is no tenderness.  Musculoskeletal: She exhibits no edema.  Neurological: She is alert and oriented to person, place, and time.  Skin: Skin is warm and dry.  Psychiatric: She has a normal mood and affect.  Nursing note and vitals reviewed.   BP 98/62 (BP Location: Left Arm, Patient Position: Sitting, Cuff Size: Normal)   Pulse 88   Temp 98.7 F (37.1 C) (Oral)   Ht 5\' 4"  (1.626 m)   Wt 168 lb 4 oz (76.3 kg)   BMI 28.88 kg/m  Wt Readings from Last 3 Encounters:  01/23/16 168 lb 4 oz (76.3 kg)  11/21/15 175 lb (79.4 kg)  09/11/15 179 lb 3.2 oz (81.3 kg)     Lab Results  Component Value Date   WBC 9.6 04/07/2014   HGB 12.6 04/07/2014   HCT 38.6 04/07/2014   PLT 371.0 04/07/2014   GLUCOSE 77 04/07/2014   CHOL 163 07/17/2011   TRIG 140.0 07/17/2011   HDL 81.90 07/17/2011   LDLCALC 53 07/17/2011   ALT 7 04/07/2014   AST 13 04/07/2014   NA 138 04/07/2014   K 3.9 04/07/2014   CL 105 04/07/2014   CREATININE 0.9 04/07/2014   BUN 14  04/07/2014   CO2 26 04/07/2014   TSH 2.07 04/07/2014    Lab Results  Component Value Date   TSH 2.07 04/07/2014   Lab Results  Component Value Date   WBC 9.6 04/07/2014   HGB 12.6 04/07/2014   HCT 38.6 04/07/2014   MCV 89.5 04/07/2014   PLT 371.0 04/07/2014   Lab Results  Component Value Date   NA 138 04/07/2014   K 3.9 04/07/2014   CO2 26 04/07/2014   GLUCOSE 77 04/07/2014   BUN 14 04/07/2014   CREATININE 0.9 04/07/2014   BILITOT 0.5 04/07/2014   ALKPHOS 71 04/07/2014  AST 13 04/07/2014   ALT 7 04/07/2014   PROT 7.7 04/07/2014   ALBUMIN 3.4 (L) 04/07/2014   ALBUMIN 3.4 (L) 04/07/2014   CALCIUM 9.4 04/07/2014   GFR 80.38 04/07/2014   Lab Results  Component Value Date   CHOL 163 07/17/2011   Lab Results  Component Value Date   HDL 81.90 07/17/2011   Lab Results  Component Value Date   LDLCALC 53 07/17/2011   Lab Results  Component Value Date   TRIG 140.0 07/17/2011   Lab Results  Component Value Date   CHOLHDL 2 07/17/2011   No results found for: HGBA1C     Assessment & Plan:   Problem List Items Addressed This Visit    Anxiety and depression    Doing well with new job and on current meds, no refills today      Tachycardia    RRR today      Insomnia    Encouraged good sleep hygiene such as dark, quiet room. No blue/green glowing lights such as computer screens in bedroom. No alcohol or stimulants in evening. Cut down on caffeine as able. Regular exercise is helpful but not just prior to bed time.       Dental infection    Started on Clindamycin and probiotics and she is to follow up with dentist for definitive treatments.      Relevant Medications   clindamycin (CLEOCIN) 300 MG capsule    Other Visit Diagnoses   None.     I have changed Ms. Krone's traMADol. I am also having her start on clindamycin. Additionally, I am having her maintain her albuterol, metoprolol, QUEtiapine, venlafaxine XR, metoprolol succinate, and  RAJANI.  Meds ordered this encounter  Medications  . clindamycin (CLEOCIN) 300 MG capsule    Sig: Take 1 capsule (300 mg total) by mouth 3 (three) times daily.    Dispense:  30 capsule    Refill:  0  . traMADol (ULTRAM) 50 MG tablet    Sig: Take 1-2 tablets (50-100 mg total) by mouth 3 (three) times daily as needed.    Dispense:  60 tablet    Refill:  0    Not to exceed 5 additional fills before 06/18/2016     Penni Homans, MD

## 2016-02-04 NOTE — Assessment & Plan Note (Signed)
Doing well with new job and on current meds, no refills today

## 2016-02-04 NOTE — Assessment & Plan Note (Signed)
RRR today 

## 2016-02-20 ENCOUNTER — Other Ambulatory Visit: Payer: Self-pay | Admitting: Family Medicine

## 2016-02-20 MED ORDER — TRAMADOL HCL 50 MG PO TABS: 50.0000 mg | ORAL_TABLET | Freq: Three times a day (TID) | ORAL | 0 refills | Status: DC | PRN

## 2016-02-20 NOTE — Telephone Encounter (Signed)
Refill printed and patient contacted to pickup hardcopy, also to do UDS as well before getting hardcopy. Note to do UDS attached to hardcopy at the front desk.

## 2016-02-26 ENCOUNTER — Other Ambulatory Visit: Payer: Self-pay | Admitting: Family Medicine

## 2016-04-25 ENCOUNTER — Encounter: Payer: Self-pay | Admitting: Family Medicine

## 2016-04-25 ENCOUNTER — Ambulatory Visit (INDEPENDENT_AMBULATORY_CARE_PROVIDER_SITE_OTHER): Payer: Self-pay | Admitting: Family Medicine

## 2016-04-25 DIAGNOSIS — Z23 Encounter for immunization: Secondary | ICD-10-CM

## 2016-04-25 DIAGNOSIS — F419 Anxiety disorder, unspecified: Secondary | ICD-10-CM

## 2016-04-25 DIAGNOSIS — F418 Other specified anxiety disorders: Secondary | ICD-10-CM

## 2016-04-25 DIAGNOSIS — R000 Tachycardia, unspecified: Secondary | ICD-10-CM

## 2016-04-25 DIAGNOSIS — M545 Low back pain: Secondary | ICD-10-CM

## 2016-04-25 DIAGNOSIS — F329 Major depressive disorder, single episode, unspecified: Secondary | ICD-10-CM

## 2016-04-25 MED ORDER — ALPRAZOLAM 0.25 MG PO TABS
0.2500 mg | ORAL_TABLET | Freq: Two times a day (BID) | ORAL | 2 refills | Status: DC | PRN
Start: 2016-04-25 — End: 2016-05-23

## 2016-04-25 MED ORDER — METOPROLOL SUCCINATE ER 25 MG PO TB24: 25.0000 mg | ORAL_TABLET | Freq: Every day | ORAL | 0 refills | Status: DC | PRN

## 2016-04-25 NOTE — Progress Notes (Signed)
Pre visit review using our clinic review tool, if applicable. No additional management support is needed unless otherwise documented below in the visit note. 

## 2016-04-25 NOTE — Patient Instructions (Signed)
Generalized Anxiety Disorder Generalized anxiety disorder (GAD) is a mental disorder. It interferes with life functions, including relationships, work, and school. GAD is different from normal anxiety, which everyone experiences at some point in their lives in response to specific life events and activities. Normal anxiety actually helps us prepare for and get through these life events and activities. Normal anxiety goes away after the event or activity is over.  GAD causes anxiety that is not necessarily related to specific events or activities. It also causes excess anxiety in proportion to specific events or activities. The anxiety associated with GAD is also difficult to control. GAD can vary from mild to severe. People with severe GAD can have intense waves of anxiety with physical symptoms (panic attacks).  SYMPTOMS The anxiety and worry associated with GAD are difficult to control. This anxiety and worry are related to many life events and activities and also occur more days than not for 6 months or longer. People with GAD also have three or more of the following symptoms (one or more in children):  Restlessness.   Fatigue.  Difficulty concentrating.   Irritability.  Muscle tension.  Difficulty sleeping or unsatisfying sleep. DIAGNOSIS GAD is diagnosed through an assessment by your health care provider. Your health care provider will ask you questions aboutyour mood,physical symptoms, and events in your life. Your health care provider may ask you about your medical history and use of alcohol or drugs, including prescription medicines. Your health care provider may also do a physical exam and blood tests. Certain medical conditions and the use of certain substances can cause symptoms similar to those associated with GAD. Your health care provider may refer you to a mental health specialist for further evaluation. TREATMENT The following therapies are usually used to treat GAD:    Medication. Antidepressant medication usually is prescribed for long-term daily control. Antianxiety medicines may be added in severe cases, especially when panic attacks occur.   Talk therapy (psychotherapy). Certain types of talk therapy can be helpful in treating GAD by providing support, education, and guidance. A form of talk therapy called cognitive behavioral therapy can teach you healthy ways to think about and react to daily life events and activities.  Stress managementtechniques. These include yoga, meditation, and exercise and can be very helpful when they are practiced regularly. A mental health specialist can help determine which treatment is best for you. Some people see improvement with one therapy. However, other people require a combination of therapies. This information is not intended to replace advice given to you by your health care provider. Make sure you discuss any questions you have with your health care provider. Document Released: 09/21/2012 Document Revised: 06/17/2014 Document Reviewed: 09/21/2012 Elsevier Interactive Patient Education  2017 Elsevier Inc.  

## 2016-05-07 NOTE — Assessment & Plan Note (Signed)
Doing well without daily Metoprolol at this time

## 2016-05-07 NOTE — Assessment & Plan Note (Signed)
Encouraged moist heat and gentle stretching as tolerated. May try NSAIDs and prescription meds as directed and report if symptoms worsen or seek care. . Managing much better without pain meds routinely

## 2016-05-07 NOTE — Assessment & Plan Note (Signed)
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 For now she does not feel she needs to change medication dosing.

## 2016-05-07 NOTE — Progress Notes (Signed)
Patient ID: Kathryn Randall, female   DOB: 02/13/1991, 25 y.o.   MRN: [id number]   Subjective:    Patient ID: Kathryn Randall, female    DOB: 1990/08/11, 25 y.o.   MRN: [id number]  Chief Complaint  Patient presents with  . Anxiety    HPI Patient is in today for follow up. Overall she is doing well. No recent illness or acute concerns. Her anxiety and depression are flared some by the increased stressors at her job as her responsibilities increase. She endorses anhedonia but not suicidal ideation. She is managing with minimal meds. Also not routinely using her Metoprolol as her tachycardia has been well controlled. Her pain is manageable and she is using minimal meds here as well. Denies CP/palp/SOB/HA/congestion/fevers/GI or GU c/o. Taking meds as prescribed  Past Medical History:  Diagnosis Date  . Allergic state 06/28/2011  . Anemia 11/14/2011  . Anxiety   . Anxiety and depression 07/17/2011  . Back pain   . Cervical cancer screening 05/26/2012  . Dental infection 02/04/2016  . Depression   . Dysmenorrhea 05/26/2012  . Hypokalemia 11/14/2011  . Insomnia 02/21/2013  . Migraine 06/28/2011  . Neck strain 06/28/2012  . Overweight(278.02) 04/27/2012  . Preventative health care 06/28/2011  . Tachycardia 11/14/2011  . UTI (lower urinary tract infection) 08/18/2011    Past Surgical History:  Procedure Laterality Date  . WISDOM TOOTH EXTRACTION  25 yrs old    Family History  Problem Relation Age of Onset  . Migraines Mother   . Arthritis Mother   . Hearing loss Mother   . Menstrual problems Mother   . Migraines Father   . Obesity Maternal Grandmother   . Heart disease Maternal Grandmother     CHF  . Hypertension Maternal Grandmother   . Hearing loss Maternal Grandmother   . Arthritis Maternal Grandmother   . Menstrual problems Maternal Grandmother   . Diabetes Maternal Grandfather     type 2  . Obesity Maternal Grandfather   . Leukemia Paternal Grandfather   . Migraines  Paternal Grandfather   . Cancer Paternal Grandfather     leukemia  . Ovarian cysts Paternal Aunt     Social History   Social History  . Marital status: Single    Spouse name: N/A  . Number of children: N/A  . Years of education: N/A   Occupational History  . Not on file.   Social History Main Topics  . Smoking status: Never Smoker  . Smokeless tobacco: Never Used     Comment: black and milds every once in awhile  . Alcohol use No  . Drug use:     Frequency: 7.0 times per week    Types: Hydrocodone  . Sexual activity: Not Currently    Partners: Male   Other Topics Concern  . Not on file   Social History Narrative  . No narrative on file    Outpatient Medications Prior to Visit  Medication Sig Dispense Refill  . albuterol (PROVENTIL HFA;VENTOLIN HFA) 108 (90 BASE) MCG/ACT inhaler Inhale 2 puffs into the lungs every 6 (six) hours as needed for wheezing or shortness of breath. 1 Inhaler 2  . RAJANI 3-0.02-0.451 MG tablet TAKE AS INSTRUCTED BY YOUR PRESCRIBER 84 tablet 0  . traMADol (ULTRAM) 50 MG tablet Take 1-2 tablets (50-100 mg total) by mouth 3 (three) times daily as needed. 60 tablet 0  . venlafaxine XR (EFFEXOR-XR) 75 MG 24 hr capsule TAKE 3 CAPSULES BY MOUTH DAILY WITH  BREAKFAST 90 capsule 5  . clindamycin (CLEOCIN) 300 MG capsule Take 1 capsule (300 mg total) by mouth 3 (three) times daily. 30 capsule 0  . metoprolol (LOPRESSOR) 50 MG tablet TAKE 1 TABLET(50 MG) BY MOUTH THREE TIMES DAILY 90 tablet 5  . metoprolol succinate (TOPROL-XL) 25 MG 24 hr tablet Take 1 tablet (25 mg total) by mouth daily. 30 tablet 5  . QUEtiapine (SEROQUEL) 200 MG tablet Take 1 tablet (200 mg total) by mouth at bedtime. 30 tablet 0   No facility-administered medications prior to visit.     Allergies  Allergen Reactions  . Ibuprofen     Racing heart  . Morphine And Related Itching    Review of Systems  Constitutional: Negative for fever and malaise/fatigue.  HENT: Negative for  congestion.   Eyes: Negative for blurred vision.  Respiratory: Negative for shortness of breath.   Cardiovascular: Negative for chest pain, palpitations and leg swelling.  Gastrointestinal: Negative for abdominal pain, blood in stool and nausea.  Genitourinary: Negative for dysuria and frequency.  Musculoskeletal: Positive for back pain. Negative for falls.  Skin: Negative for rash.  Neurological: Negative for dizziness, loss of consciousness and headaches.  Endo/Heme/Allergies: Negative for environmental allergies.  Psychiatric/Behavioral: Positive for depression. The patient is nervous/anxious.        Objective:    Physical Exam  Constitutional: She is oriented to person, place, and time. She appears well-developed and well-nourished. No distress.  HENT:  Head: Normocephalic and atraumatic.  Nose: Nose normal.  Eyes: Right eye exhibits no discharge. Left eye exhibits no discharge.  Neck: Normal range of motion. Neck supple.  Cardiovascular: Normal rate and regular rhythm.   No murmur heard. Pulmonary/Chest: Effort normal and breath sounds normal.  Abdominal: Soft. Bowel sounds are normal. There is no tenderness.  Musculoskeletal: She exhibits no edema.  Neurological: She is alert and oriented to person, place, and time.  Skin: Skin is warm and dry.  Psychiatric: She has a normal mood and affect.  Nursing note and vitals reviewed.   BP 112/62 (BP Location: Right Arm, Patient Position: Sitting, Cuff Size: Normal)   Pulse 92   Temp 98.3 F (36.8 C) (Oral)   Ht 5\' 4"  (1.626 m)   Wt 165 lb 2 oz (74.9 kg)   SpO2 98%   BMI 28.34 kg/m  Wt Readings from Last 3 Encounters:  04/25/16 165 lb 2 oz (74.9 kg)  01/23/16 168 lb 4 oz (76.3 kg)  11/21/15 175 lb (79.4 kg)     Lab Results  Component Value Date   WBC 9.6 04/07/2014   HGB 12.6 04/07/2014   HCT 38.6 04/07/2014   PLT 371.0 04/07/2014   GLUCOSE 77 04/07/2014   CHOL 163 07/17/2011   TRIG 140.0 07/17/2011   HDL 81.90  07/17/2011   LDLCALC 53 07/17/2011   ALT 7 04/07/2014   AST 13 04/07/2014   NA 138 04/07/2014   K 3.9 04/07/2014   CL 105 04/07/2014   CREATININE 0.9 04/07/2014   BUN 14 04/07/2014   CO2 26 04/07/2014   TSH 2.07 04/07/2014    Lab Results  Component Value Date   TSH 2.07 04/07/2014   Lab Results  Component Value Date   WBC 9.6 04/07/2014   HGB 12.6 04/07/2014   HCT 38.6 04/07/2014   MCV 89.5 04/07/2014   PLT 371.0 04/07/2014   Lab Results  Component Value Date   NA 138 04/07/2014   K 3.9 04/07/2014   CO2 26  04/07/2014   GLUCOSE 77 04/07/2014   BUN 14 04/07/2014   CREATININE 0.9 04/07/2014   BILITOT 0.5 04/07/2014   ALKPHOS 71 04/07/2014   AST 13 04/07/2014   ALT 7 04/07/2014   PROT 7.7 04/07/2014   ALBUMIN 3.4 (L) 04/07/2014   ALBUMIN 3.4 (L) 04/07/2014   CALCIUM 9.4 04/07/2014   GFR 80.38 04/07/2014   Lab Results  Component Value Date   CHOL 163 07/17/2011   Lab Results  Component Value Date   HDL 81.90 07/17/2011   Lab Results  Component Value Date   LDLCALC 53 07/17/2011   Lab Results  Component Value Date   TRIG 140.0 07/17/2011   Lab Results  Component Value Date   CHOLHDL 2 07/17/2011   No results found for: HGBA1C     Assessment & Plan:   Problem List Items Addressed This Visit    Back pain    Encouraged moist heat and gentle stretching as tolerated. May try NSAIDs and prescription meds as directed and report if symptoms worsen or seek care. . 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 For now she does not feel she needs to change medication dosing.       Tachycardia    Doing well without daily Metoprolol at this time       Other Visit Diagnoses    Encounter for immunization       Relevant Orders   Flu Vaccine QUAD 36+ mos IM (Completed)      I have discontinued  Ms. Weiskopf's metoprolol, QUEtiapine, and clindamycin. I have also changed her metoprolol succinate. Additionally, I am having her start on ALPRAZolam. Lastly, I am having her maintain her albuterol, venlafaxine XR, traMADol, and RAJANI.  Meds ordered this encounter  Medications  . metoprolol succinate (TOPROL-XL) 25 MG 24 hr tablet    Sig: Take 1 tablet (25 mg total) by mouth daily as needed (palpitations).    Dispense:  30 tablet    Refill:  0  . ALPRAZolam (XANAX) 0.25 MG tablet    Sig: Take 1 tablet (0.25 mg total) by mouth 2 (two) times daily as needed for anxiety.    Dispense:  40 tablet    Refill:  2     Penni Homans, MD

## 2016-05-13 ENCOUNTER — Telehealth: Payer: Self-pay

## 2016-05-13 NOTE — Telephone Encounter (Signed)
Rx for Tramadol printed on 02/20/16 and per the note in the patients chart 06/20/15 the patient is no longer able to receive controlled medications from our office. Rx for Tramadol was shredded.

## 2016-05-15 ENCOUNTER — Telehealth: Payer: Self-pay | Admitting: Family Medicine

## 2016-05-15 NOTE — Telephone Encounter (Signed)
Patient stated that she tried to get her ALPRAZolam Duanne Moron) 0.25 MG [facility] [place], instead they transferred it to [place] (13 hours away). Patient states that the pharmacy told her that since this is a controlled substance they would not be able transfer it back and she needed to contact her PCP. Please advise.   [facility] [address] [place], [place] [address]  Patient phone: [phone]

## 2016-05-16 NOTE — Telephone Encounter (Signed)
Call pharmacy and confirm this happened and then can refill to requested pharmacy

## 2016-05-16 NOTE — Telephone Encounter (Signed)
Advise on this refill transfer.

## 2016-05-16 NOTE — Telephone Encounter (Signed)
[facility] [address] [place].

## 2016-05-16 NOTE — Telephone Encounter (Signed)
[facility] [place] and they stated her last refill for Alprazolam was on 04/25/2016 at [facility] [address] in [place].  So too early for a refill and patient [facility] left [facility] [address] and can go back there for her further refills. callled left message to call back.

## 2016-05-17 NOTE — Telephone Encounter (Signed)
Patient informed the alprazolam was taken to the [facility] [address] and once ready for the refill she has 2 left and can go there to get.

## 2016-05-22 ENCOUNTER — Other Ambulatory Visit: Payer: Self-pay | Admitting: Family Medicine

## 2016-05-23 MED ORDER — ALPRAZOLAM 0.25 MG PO TABS: 0.2500 mg | ORAL_TABLET | Freq: Two times a day (BID) | ORAL | 1 refills | Status: DC | PRN

## 2016-05-23 NOTE — Addendum Note (Signed)
Addended by: Sharon Seller B on: 05/23/2016 01:55 PM   Modules accepted: Orders

## 2016-05-23 NOTE — Telephone Encounter (Signed)
Patient called stating that Rx for Alp[facility]olam [facility] [place] [facility] of [place] #[id number] [address], [place] [phone] (Phone) [phone] (Fax)   [place] send to [place], [place] pharmacy

## 2016-05-23 NOTE — Telephone Encounter (Signed)
Called [facility] in [place] they stated the patient evidently transferred her refills on this script by accident to [facility].  She meant it to go to [place]. Last refill was on 04/25/2016 and she is in need of a refill now.  [facility] told me due to being controlled they can transfer refills  Only once so that being said a new script needs to be printed and faxed into the [facility] [place]

## 2016-05-23 NOTE — Addendum Note (Signed)
Addended by: Sharon Seller B on: 05/23/2016 12:07 PM   Modules accepted: Orders

## 2016-05-23 NOTE — Telephone Encounter (Signed)
[facility] Patient notified of refill

## 2016-05-23 NOTE — Telephone Encounter (Signed)
OK to rewrite rx

## 2016-06-17 ENCOUNTER — Other Ambulatory Visit: Payer: Self-pay | Admitting: Family Medicine

## 2016-07-17 ENCOUNTER — Other Ambulatory Visit: Payer: Self-pay | Admitting: Family Medicine

## 2016-07-18 ENCOUNTER — Encounter: Payer: BLUE CROSS/BLUE SHIELD | Admitting: Family Medicine

## 2016-09-18 ENCOUNTER — Other Ambulatory Visit: Payer: Self-pay | Admitting: Family Medicine

## 2016-11-11 ENCOUNTER — Other Ambulatory Visit: Payer: Self-pay | Admitting: Family Medicine

## 2017-01-13 ENCOUNTER — Other Ambulatory Visit: Payer: Self-pay | Admitting: Family Medicine

## 2017-02-21 ENCOUNTER — Ambulatory Visit: Payer: Self-pay | Admitting: Family Medicine

## 2017-03-14 ENCOUNTER — Other Ambulatory Visit: Payer: Self-pay | Admitting: Family Medicine

## 2017-03-17 ENCOUNTER — Encounter: Payer: Self-pay | Admitting: Family Medicine

## 2017-04-13 ENCOUNTER — Other Ambulatory Visit: Payer: Self-pay | Admitting: Family Medicine

## 2017-05-16 ENCOUNTER — Other Ambulatory Visit: Payer: Self-pay | Admitting: Family Medicine

## 2017-06-10 NOTE — L&D Delivery Note (Signed)
Patient: SAREN CORKERN MRN: [id number]  GBS status: Negative, IAP given None  Patient is a 27 y.o. now G2P0000 s/p NSVD at [redacted]w[redacted]d, who was admitted for PROM. PROM at 1800 on 04/03/18 prior to delivery with clear fluid.    Delivery Note At 3:07 PM a viable female was delivered via Vaginal, Spontaneous (Presentation: vertex).  APGAR: 8, 9; weight pending .   Placenta status: intact.  Cord: 3 vessel with the following complications: none   Anesthesia: epidural, topical lidocaine Episiotomy: None Lacerations: 2nd degree;Perineal;Periurethral Suture Repair: 3.0 vicryl 4.0 vicryl Est. Blood Loss (mL): 88  Mom to postpartum.  Baby to Couplet care / Skin to Skin.  Donoven Pett E Winda Summerall 04/04/2018, 3:42 PM     Head delivered OA. No nuchal cord present. Shoulder and body delivered in usual fashion. Infant with spontaneous cry, placed on mother's abdomen, dried and bulb suctioned. Cord clamped x 2 after 1-minute delay, and cut by family member. Cord blood drawn. Placenta delivered spontaneously with gentle cord traction. Fundus firm with massage and Pitocin. Perineum inspected and found to have x1 perineal laceration and x3 labial/periurethral lacerations which were repaired with 3.0 vicryl and 4.0 vicryl, respectively, with good hemostasis achieved.

## 2017-06-15 ENCOUNTER — Other Ambulatory Visit: Payer: Self-pay | Admitting: Family Medicine

## 2017-07-15 ENCOUNTER — Other Ambulatory Visit: Payer: Self-pay | Admitting: Family Medicine

## 2017-08-16 ENCOUNTER — Other Ambulatory Visit: Payer: Self-pay | Admitting: Family Medicine

## 2017-08-31 NOTE — Progress Notes (Signed)
[place] at [facility] [address] [place], [place] [address] [phone]  Date:  09/01/2017   Name:  Kathryn Randall   DOB:  May 07, 1991   MRN:  [id number]  PCP:  Mosie Lukes, MD    Chief Complaint: Possible Pregnancy (4 positive pregnancy tests, dehydration, trouble eating) and Antidepressant (trouble stpping medication, withdrawls)   History of Present Illness:  Kathryn Randall is a 27 y.o. very pleasant female patient who presents with the following:  Pt of Dr. Charlett Blake, here today with concern of possible pregnancy - she took 4 tests at home and all were positive Her LMP was about 6-8 weeks ago Never pregnant before She first took a home test last week and it was positive. She was having sx of pain in her RLQ the last day or so,which prompted her to seek care She still has pain on and off today, not really severe  She noted a bit of brown discharge "here and there" This is her first pregnancy  Here today with her mom who is supportive She plans to have the baby but she is not thrilled at the prospect.  Her parents and BF are supportive. States that her BF is excited about a child  She is taking just effexor right now- no other meds  Wonders if she can continue to take this medication during her pregnancy- discussed the lack of any controlled studies but we think fetal risk is now. She feels that she needs this medication for her mental health so will have her continue for now  Her estimates that her LMP was 07/20/17, which would place her at 6+1 today Patient Active Problem List   Diagnosis Date Noted  . Dental infection 02/04/2016  . 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 Insomnia 02/21/2013  . Cerumen impaction 06/28/2012  . Neck strain 06/28/2012  . Cervical cancer screening 05/26/2012  . Overweight 04/27/2012  . Tachycardia 11/14/2011  . Anemia 11/14/2011  . Hypokalemia 11/14/2011  . Anxiety and depression  07/17/2011  . Migraine 06/28/2011  . Contraceptive management 06/28/2011  . Allergic state 06/28/2011  . Preventative health care 06/28/2011  . Back pain     Past Medical History:  Diagnosis Date  . Allergic state 06/28/2011  . Anemia 11/14/2011  . Anxiety   . Anxiety and depression 07/17/2011  . Back pain   . Cervical cancer screening 05/26/2012  . Dental infection 02/04/2016  . Depression   . Dysmenorrhea 05/26/2012  . Hypokalemia 11/14/2011  . Insomnia 02/21/2013  . Migraine 06/28/2011  . Neck strain 06/28/2012  . Overweight(278.02) 04/27/2012  . Preventative health care 06/28/2011  . Tachycardia 11/14/2011  . UTI (lower urinary tract infection) 08/18/2011    Past Surgical History:  Procedure Laterality Date  . WISDOM TOOTH EXTRACTION  27 yrs old    Social History   Tobacco Use  . Smoking status: Never Smoker  . Smokeless tobacco: Never Used  . Tobacco comment: black and milds every once in awhile  Substance Use Topics  . Alcohol use: No  . Drug use: Yes    Frequency: 7.0 times per week    Types: Hydrocodone    Family History  Problem Relation Age of Onset  . Migraines Mother   . Arthritis Mother   . Hearing loss Mother   . Menstrual problems Mother   . Migraines Father   . Obesity Maternal Grandmother   . Heart disease Maternal  Grandmother        CHF  . Hypertension Maternal Grandmother   . Hearing loss Maternal Grandmother   . Arthritis Maternal Grandmother   . Menstrual problems Maternal Grandmother   . Diabetes Maternal Grandfather        type 2  . Obesity Maternal Grandfather   . Leukemia Paternal Grandfather   . Migraines Paternal Grandfather   . Cancer Paternal Grandfather        leukemia  . Ovarian cysts Paternal Aunt     Allergies  Allergen Reactions  . Ibuprofen     Racing heart  . Morphine And Related Itching    Medication list has been reviewed and updated.  Current Outpatient Medications on File Prior to Visit  Medication Sig Dispense  Refill  . venlafaxine XR (EFFEXOR-XR) 75 MG 24 hr capsule TAKE 3 CAPSULES BY MOUTH DAILY WITH BREAKFAST 42 capsule 0  . albuterol (PROVENTIL HFA;VENTOLIN HFA) 108 (90 BASE) MCG/ACT inhaler Inhale 2 puffs into the lungs every 6 (six) hours as needed for wheezing or shortness of breath. (Patient not taking: Reported on 09/01/2017) 1 Inhaler 2  . ALPRAZolam (XANAX) 0.25 MG tablet Take 1 tablet (0.25 mg total) by mouth 2 (two) times daily as needed for anxiety. (Patient not taking: Reported on 09/01/2017) 40 tablet 1  . metoprolol succinate (TOPROL-XL) 25 MG 24 hr tablet Take 1 tablet (25 mg total) by mouth daily as needed (palpitations). (Patient not taking: Reported on 09/01/2017) 30 tablet 0  . RAJANI 3-0.02-0.451 MG tablet TAKE AS INSTRUCTED BY YOUR PRESCRIBER (Patient not taking: Reported on 09/01/2017) 84 tablet 0  . traMADol (ULTRAM) 50 MG tablet Take 1-2 tablets (50-100 mg total) by mouth 3 (three) times daily as needed. (Patient not taking: Reported on 09/01/2017) 60 tablet 0   No current facility-administered medications on file prior to visit.     Review of Systems:  As per HPI- otherwise negative.   Physical Examination: Vitals:   09/01/17 1118  BP: 106/71  Pulse: 99  Resp: 16  SpO2: 100%   Vitals:   09/01/17 1118  Weight: 139 lb (63 kg)  Height: 5\' 4"  (1.626 m)   Body mass index is 23.86 kg/m. Ideal Body Weight: Weight in (lb) to have BMI = 25: 145.3  GEN: WDWN, NAD, Non-toxic, A & O x 3, looks well, normal weight HEENT: Atraumatic, Normocephalic. Neck supple. No masses, No LAD. Ears and Nose: No external deformity. CV: RRR, No M/G/R. No JVD. No thrill. No extra heart sounds. PULM: CTA B, no wheezes, crackles, rhonchi. No retractions. No resp. distress. No accessory muscle use. ABD: S, NT, ND, +BS. No rebound. No HSM. EXTR: No c/c/e NEURO Normal gait.  PSYCH: Normally interactive. Conversant. Not depressed or anxious appearing.  Calm demeanor.   Results for orders  placed or performed in visit on 09/01/17  POCT urine pregnancy  Result Value Ref Range   Preg Test, Ur Positive (A) Negative    Assessment and Plan: Pregnancy test performed, pregnancy confirmed - Plan: POCT urine pregnancy  Here today for confirmation of pregnancy.  Pt has noted some RLQ pain and brown spotting. She is also uninsured and needs to get started on medicaid.  Referral to [facility] now to confirm IUP.  She will travel there with her mom.  All questions answered   Signed Lamar Blinks, MD

## 2017-09-01 ENCOUNTER — Inpatient Hospital Stay (HOSPITAL_COMMUNITY)
Admission: AD | Admit: 2017-09-01 | Discharge: 2017-09-01 | Disposition: A | Discharge status: Home / Self Care | Payer: Medicaid Other | Source: Ambulatory Visit | Attending: Obstetrics & Gynecology | Admitting: Obstetrics & Gynecology

## 2017-09-01 ENCOUNTER — Other Ambulatory Visit: Payer: Self-pay

## 2017-09-01 ENCOUNTER — Inpatient Hospital Stay (HOSPITAL_COMMUNITY): Payer: Medicaid Other

## 2017-09-01 ENCOUNTER — Encounter: Payer: Self-pay | Admitting: Family Medicine

## 2017-09-01 ENCOUNTER — Ambulatory Visit: Payer: Self-pay | Admitting: Family Medicine

## 2017-09-01 ENCOUNTER — Encounter (HOSPITAL_COMMUNITY): Payer: Self-pay | Admitting: *Deleted

## 2017-09-01 VITALS — BP 106/71 | HR 99 | Resp 16 | Ht 64.0 in | Wt 139.0 lb

## 2017-09-01 DIAGNOSIS — Z87440 Personal history of urinary (tract) infections: Secondary | ICD-10-CM | POA: Diagnosis not present

## 2017-09-01 DIAGNOSIS — R109 Unspecified abdominal pain: Secondary | ICD-10-CM | POA: Insufficient documentation

## 2017-09-01 DIAGNOSIS — F419 Anxiety disorder, unspecified: Secondary | ICD-10-CM | POA: Insufficient documentation

## 2017-09-01 DIAGNOSIS — Z3491 Encounter for supervision of normal pregnancy, unspecified, first trimester: Secondary | ICD-10-CM

## 2017-09-01 DIAGNOSIS — Z886 Allergy status to analgesic agent status: Secondary | ICD-10-CM | POA: Diagnosis not present

## 2017-09-01 DIAGNOSIS — Z3A01 Less than 8 weeks gestation of pregnancy: Secondary | ICD-10-CM | POA: Insufficient documentation

## 2017-09-01 DIAGNOSIS — O26891 Other specified pregnancy related conditions, first trimester: Secondary | ICD-10-CM | POA: Diagnosis not present

## 2017-09-01 DIAGNOSIS — Z79899 Other long term (current) drug therapy: Secondary | ICD-10-CM | POA: Diagnosis not present

## 2017-09-01 DIAGNOSIS — Z3201 Encounter for pregnancy test, result positive: Secondary | ICD-10-CM | POA: Insufficient documentation

## 2017-09-01 DIAGNOSIS — F329 Major depressive disorder, single episode, unspecified: Secondary | ICD-10-CM | POA: Diagnosis not present

## 2017-09-01 DIAGNOSIS — O99341 Other mental disorders complicating pregnancy, first trimester: Secondary | ICD-10-CM | POA: Diagnosis not present

## 2017-09-01 DIAGNOSIS — Z885 Allergy status to narcotic agent status: Secondary | ICD-10-CM | POA: Diagnosis not present

## 2017-09-01 DIAGNOSIS — O26899 Other specified pregnancy related conditions, unspecified trimester: Secondary | ICD-10-CM

## 2017-09-01 LAB — COMPREHENSIVE METABOLIC PANEL
ALK PHOS: 52 U/L (ref 38–126)
ALT: 12 U/L — AB (ref 14–54)
AST: 19 U/L (ref 15–41)
Albumin: 4.1 g/dL (ref 3.5–5.0)
Anion gap: 13 (ref 5–15)
BUN: 13 mg/dL (ref 6–20)
CALCIUM: 9.3 mg/dL (ref 8.9–10.3)
CO2: 21 mmol/L — ABNORMAL LOW (ref 22–32)
CREATININE: 0.54 mg/dL (ref 0.44–1.00)
Chloride: 103 mmol/L (ref 101–111)
Glucose, Bld: 87 mg/dL (ref 65–99)
Potassium: 4.2 mmol/L (ref 3.5–5.1)
Sodium: 137 mmol/L (ref 135–145)
Total Bilirubin: 0.4 mg/dL (ref 0.3–1.2)
Total Protein: 7.7 g/dL (ref 6.5–8.1)

## 2017-09-01 LAB — URINALYSIS, ROUTINE W REFLEX MICROSCOPIC
BILIRUBIN URINE: NEGATIVE
GLUCOSE, UA: NEGATIVE mg/dL
HGB URINE DIPSTICK: NEGATIVE
KETONES UR: NEGATIVE mg/dL
Leukocytes, UA: NEGATIVE
Nitrite: NEGATIVE
PH: 6 (ref 5.0–8.0)
Protein, ur: NEGATIVE mg/dL
SPECIFIC GRAVITY, URINE: 1.018 (ref 1.005–1.030)

## 2017-09-01 LAB — CBC WITH DIFFERENTIAL/PLATELET
Basophils Absolute: 0 10*3/uL (ref 0.0–0.1)
Basophils Relative: 1 %
EOS PCT: 1 %
Eosinophils Absolute: 0 10*3/uL (ref 0.0–0.7)
HCT: 36.9 % (ref 36.0–46.0)
HEMOGLOBIN: 12.2 g/dL (ref 12.0–15.0)
LYMPHS ABS: 2.3 10*3/uL (ref 0.7–4.0)
Lymphocytes Relative: 36 %
MCH: 28.8 pg (ref 26.0–34.0)
MCHC: 33.1 g/dL (ref 30.0–36.0)
MCV: 87 fL (ref 78.0–100.0)
Monocytes Absolute: 0.3 10*3/uL (ref 0.1–1.0)
Monocytes Relative: 4 %
NEUTROS PCT: 58 %
Neutro Abs: 3.8 10*3/uL (ref 1.7–7.7)
Platelets: 380 10*3/uL (ref 150–400)
RBC: 4.24 MIL/uL (ref 3.87–5.11)
RDW: 14.2 % (ref 11.5–15.5)
WBC: 6.4 10*3/uL (ref 4.0–10.5)

## 2017-09-01 LAB — POCT URINE PREGNANCY: Preg Test, Ur: POSITIVE — AB

## 2017-09-01 LAB — WET PREP, GENITAL
Clue Cells Wet Prep HPF POC: NONE SEEN
Sperm: NONE SEEN
Trich, Wet Prep: NONE SEEN
Yeast Wet Prep HPF POC: NONE SEEN

## 2017-09-01 LAB — ABO/RH: ABO/RH(D): B POS

## 2017-09-01 LAB — HCG, QUANTITATIVE, PREGNANCY: HCG, BETA CHAIN, QUANT, S: 84942 m[IU]/mL — AB (ref <5)

## 2017-09-01 NOTE — MAU Note (Signed)
Pt reports having RLQ pain since yesterday. Had positive HPT and went to MD office today. Confirmed pregnancy but ned U/S to R/O ectopic. Reports some vag discharge.

## 2017-09-01 NOTE — MAU Provider Note (Signed)
History     CSN: [id number]  Arrival date and time: 09/01/17 1215   First Provider Initiated Contact with Patient 09/01/17 1339      Chief Complaint  Patient presents with  . Abdominal Pain   G1 @[redacted]w[redacted]d  by LMP here with RLQ pain. Pain started about 2 weeks ago. Describes as intermittent, sharp, and rates 7/10. Pain is worse with walking. She has not tried OTC for it. Denies VB or discharge. No new partner. No urinary sx. No fevers.   OB History    Gravida  1   Para  0   Term  0   Preterm  0   AB  0   Living  0     SAB  0   TAB  0   Ectopic  0   Multiple  0   Live Births  0           Past Medical History:  Diagnosis Date  . Allergic state 06/28/2011  . Anemia 11/14/2011  . Anxiety   . Anxiety and depression 07/17/2011  . Back pain   . Cervical cancer screening 05/26/2012  . Dental infection 02/04/2016  . Depression    Pt. States "disorder is being called bipolar"  . Dysmenorrhea 05/26/2012  . Hypokalemia 11/14/2011  . Insomnia 02/21/2013  . Migraine 06/28/2011  . Neck strain 06/28/2012  . Overweight(278.02) 04/27/2012  . Preventative health care 06/28/2011  . Tachycardia 11/14/2011  . UTI (lower urinary tract infection) 08/18/2011    Past Surgical History:  Procedure Laterality Date  . WISDOM TOOTH EXTRACTION  27 yrs old    Family History  Problem Relation Age of Onset  . Migraines Mother   . Arthritis Mother   . Hearing loss Mother   . Menstrual problems Mother   . Migraines Father   . Obesity Maternal Grandmother   . Heart disease Maternal Grandmother        CHF  . Hypertension Maternal Grandmother   . Hearing loss Maternal Grandmother   . Arthritis Maternal Grandmother   . Menstrual problems Maternal Grandmother   . Diabetes Maternal Grandfather        type 2  . Obesity Maternal Grandfather   . Leukemia Paternal Grandfather   . Migraines Paternal Grandfather   . Cancer Paternal Grandfather        leukemia  . Ovarian cysts Paternal Aunt      Social History   Tobacco Use  . Smoking status: Never Smoker  . Smokeless tobacco: Never Used  . Tobacco comment: black and milds every once in awhile  Substance Use Topics  . Alcohol use: No  . Drug use: Not Currently    Comment: "they put me on hydrocodone for my periods."    Allergies:  Allergies  Allergen Reactions  . Ibuprofen     Racing heart  . Morphine And Related Hives and Itching    Medications Prior to Admission  Medication Sig Dispense Refill Last Dose  . dimenhyDRINATE (DRAMAMINE) 50 MG tablet Take 50 mg by mouth 2 (two) times daily as needed for nausea.   09/01/2017 at Unknown time  . Prenatal Vit-Fe Fumarate-FA (PRENATAL MULTIVITAMIN) TABS tablet Take 1 tablet by mouth daily at 12 noon.   09/01/2017 at Unknown time  . venlafaxine XR (EFFEXOR-XR) 75 MG 24 hr capsule TAKE 3 CAPSULES BY MOUTH DAILY WITH BREAKFAST 42 capsule 0 09/01/2017 at Unknown time    Review of Systems  Constitutional: Negative for chills and fever.  Gastrointestinal: Positive for abdominal pain, nausea and vomiting. Negative for constipation and diarrhea.  Genitourinary: Negative for dysuria, frequency, urgency, vaginal bleeding and vaginal discharge.   Physical Exam   Blood pressure 119/68, pulse (!) 108, temperature 98.6 F (37 C), resp. rate 18, height 5\' 4"  (1.626 m), weight 137 lb (62.1 kg), last menstrual period 07/20/2017.  Physical Exam  Nursing note and vitals reviewed. Constitutional: She is oriented to person, place, and time. She appears well-developed and well-nourished.  HENT:  Head: Normocephalic and atraumatic.  Neck: Normal range of motion.  Respiratory: Effort normal. No respiratory distress.  GI: Soft. She exhibits no distension and no mass. There is no tenderness. There is no rebound and no guarding.  Genitourinary:  Genitourinary Comments: External: no lesions or erythema Vagina: rugated, pink, moist, small thin yellow discharge Uterus: non enlarged, anteverted,  non tender, no CMT Adnexae: no masses, no tenderness left, no tenderness right Cervix closed/long   Neurological: She is alert and oriented to person, place, and time.  Skin: Skin is warm and dry.  Psychiatric: She has a normal mood and affect.   Results for orders placed or performed during the hospital encounter of 09/01/17 (from the past 24 hour(s))  CBC with Differential/Platelet     Status: None   Collection Time: 09/01/17  1:03 PM  Result Value Ref Range   WBC 6.4 4.0 - 10.5 K/uL   RBC 4.24 3.87 - 5.11 MIL/uL   Hemoglobin 12.2 12.0 - 15.0 g/dL   HCT 36.9 36.0 - 46.0 %   MCV 87.0 78.0 - 100.0 fL   MCH 28.8 26.0 - 34.0 pg   MCHC 33.1 30.0 - 36.0 g/dL   RDW 14.2 11.5 - 15.5 %   Platelets 380 150 - 400 K/uL   Neutrophils Relative % 58 %   Neutro Abs 3.8 1.7 - 7.7 K/uL   Lymphocytes Relative 36 %   Lymphs Abs 2.3 0.7 - 4.0 K/uL   Monocytes Relative 4 %   Monocytes Absolute 0.3 0.1 - 1.0 K/uL   Eosinophils Relative 1 %   Eosinophils Absolute 0.0 0.0 - 0.7 K/uL   Basophils Relative 1 %   Basophils Absolute 0.0 0.0 - 0.1 K/uL  Comprehensive metabolic panel     Status: Abnormal   Collection Time: 09/01/17  1:03 PM  Result Value Ref Range   Sodium 137 135 - 145 mmol/L   Potassium 4.2 3.5 - 5.1 mmol/L   Chloride 103 101 - 111 mmol/L   CO2 21 (L) 22 - 32 mmol/L   Glucose, Bld 87 65 - 99 mg/dL   BUN 13 6 - 20 mg/dL   Creatinine, Ser 0.54 0.44 - 1.00 mg/dL   Calcium 9.3 8.9 - 10.3 mg/dL   Total Protein 7.7 6.5 - 8.1 g/dL   Albumin 4.1 3.5 - 5.0 g/dL   AST 19 15 - 41 U/L   ALT 12 (L) 14 - 54 U/L   Alkaline Phosphatase 52 38 - 126 U/L   Total Bilirubin 0.4 0.3 - 1.2 mg/dL   GFR calc non Af Amer >60 >60 mL/min   GFR calc Af Amer >60 >60 mL/min   Anion gap 13 5 - 15  hCG, quantitative, pregnancy     Status: Abnormal   Collection Time: 09/01/17  1:04 PM  Result Value Ref Range   hCG, Beta Chain, Quant, S 84,942 (H) <5 mIU/mL  Wet prep, genital     Status: Abnormal    Collection Time: 09/01/17  1:40 PM  Result Value Ref Range   Yeast Wet Prep HPF POC NONE SEEN NONE SEEN   Trich, Wet Prep NONE SEEN NONE SEEN   Clue Cells Wet Prep HPF POC NONE SEEN NONE SEEN   WBC, Wet Prep HPF POC FEW (A) NONE SEEN   Sperm NONE SEEN   Urinalysis, Routine w reflex microscopic     Status: Abnormal   Collection Time: 09/01/17  1:42 PM  Result Value Ref Range   Color, Urine YELLOW YELLOW   APPearance HAZY (A) CLEAR   Specific Gravity, Urine 1.018 1.005 - 1.030   pH 6.0 5.0 - 8.0   Glucose, UA NEGATIVE NEGATIVE mg/dL   Hgb urine dipstick NEGATIVE NEGATIVE   Bilirubin Urine NEGATIVE NEGATIVE   Ketones, ur NEGATIVE NEGATIVE mg/dL   Protein, ur NEGATIVE NEGATIVE mg/dL   Nitrite NEGATIVE NEGATIVE   Leukocytes, UA NEGATIVE NEGATIVE   US Ob Less Than 14 Weeks With Ob Transvaginal  Result Date: 09/01/2017 CLINICAL DATA:  Right lower quadrant pain for 2 days. Gestational age by LMP of 6 weeks 1 day. EXAM: OBSTETRIC <14 WK [place] AND TRANSVAGINAL OB US TECHNIQUE: Both transabdominal and transvaginal ultrasound examinations were performed for complete evaluation of the gestation as well as the maternal uterus, adnexal regions, and pelvic cul-de-sac. Transvaginal technique was performed to assess early pregnancy. COMPARISON:  None. FINDINGS: Intrauterine gestational sac: Single Yolk sac:  Visualized. Embryo:  Visualized. Cardiac Activity: Visualized. Heart Rate: 133 bpm CRL:  11 mm   7 w   1 d                  [place] EDC: 04/19/2018 Subchorionic hemorrhage:  None visualized. Maternal uterus/adnexae: Retroverted uterus. Both ovaries are normal appearance. No mass or abnormal free fluid identified. IMPRESSION: Single living IUP measuring 7 weeks 1 day, with [place] EDC of 04/19/2018. No significant maternal uterine or adnexal abnormality identified. Electronically Signed   By: Earle Gell M.D.   On: 09/01/2017 14:59   MAU Course  Procedures  MDM Labs and [place] ordered and reviewed. Normal IUP on [place].  Pain likely physiologic to early pregnancy. Unisom and B6 for N/V. Stable for discharge home.   Assessment and Plan  [redacted] weeks gestation Normal IUP on ultrasound Discharge home Follow up with OB provider of choice Return precautions  Allergies as of 09/01/2017      Reactions   Ibuprofen    Racing heart   Morphine And Related Hives, Itching      Medication List    STOP taking these medications   dimenhyDRINATE 50 MG tablet Commonly known as:  DRAMAMINE     TAKE these medications   prenatal multivitamin Tabs tablet Take 1 tablet by mouth daily at 12 noon.   venlafaxine XR 75 MG 24 hr capsule Commonly known as:  EFFEXOR-XR TAKE 3 CAPSULES BY MOUTH DAILY WITH BREAKFAST      Julianne Handler, CNM 09/01/2017, 3:16 PM

## 2017-09-01 NOTE — Patient Instructions (Signed)
Please go straight to the [facility] in [place]- you will be seen in their "emergency room" They can make sure all is well with your pregnancy so far, and can also give you the information you need to get set up with Medicaid for your pregnancy Let us know if we can help further Please start on pre-natal vitamins

## 2017-09-01 NOTE — Discharge Instructions (Signed)

## 2017-09-02 LAB — GC/CHLAMYDIA PROBE AMP (~~LOC~~) NOT AT ARMC
Chlamydia: NEGATIVE
NEISSERIA GONORRHEA: NEGATIVE

## 2017-09-02 LAB — HIV ANTIBODY (ROUTINE TESTING W REFLEX): HIV Screen 4th Generation wRfx: NONREACTIVE

## 2017-09-03 ENCOUNTER — Other Ambulatory Visit: Payer: Self-pay | Admitting: Family Medicine

## 2017-09-04 ENCOUNTER — Other Ambulatory Visit: Payer: Self-pay | Admitting: Family Medicine

## 2017-09-04 NOTE — Telephone Encounter (Signed)
Patient found out recently she is pregnant. Please advise on Effexor before refilling.

## 2017-09-05 NOTE — Telephone Encounter (Signed)
I actually thought she was not seeing [place] anymore. Has she established with OB. If not she should and she should start a Prenatal vitamin with iron she can get OTC. Some OB's will keep people on Effexor some will not. I would be willing to send her in a prescription for Effexor XR 37.5 tabs so she can wean off. 1 tab po daily, disp #30 with 0 RF. Can take one daily for 3 weeks then 1 every other day til gone and then stop.

## 2017-09-09 NOTE — Telephone Encounter (Signed)
Called patient - unable to leave voicemail. Mail box full

## 2017-09-18 ENCOUNTER — Other Ambulatory Visit: Payer: Self-pay | Admitting: Family Medicine

## 2017-10-01 ENCOUNTER — Encounter: Payer: Self-pay | Admitting: Obstetrics

## 2017-10-03 ENCOUNTER — Other Ambulatory Visit: Payer: Self-pay | Admitting: Family Medicine

## 2017-10-06 ENCOUNTER — Telehealth: Payer: Self-pay | Admitting: Family Medicine

## 2017-10-06 NOTE — Telephone Encounter (Signed)
I am willing to prescribe her a 30 day supply til she can see OB/GYN and then they can take over her med, I cannot keep prescribing when I have not seen her for over a year

## 2017-10-06 NOTE — Telephone Encounter (Signed)
Dr Charlett Blake-- pt is requesting venlafaxine refill. She just saw Dr Lorelei Pont to confirm pregnancy on 09/01/17 otherwise, pt has not seen you since 2017. 09/01/17 OV says it is ok to continue venlafaxine.  Please advise refill request?

## 2017-10-07 MED ORDER — VENLAFAXINE HCL ER 75 MG PO CP24: 75.0000 mg | ORAL_CAPSULE | Freq: Every day | ORAL | 0 refills | Status: DC

## 2017-10-07 NOTE — Telephone Encounter (Signed)
Called patient mailbox is full unable to leave voicemail

## 2017-10-07 NOTE — Telephone Encounter (Signed)
Looks like Pt is pregnant- this medications has been refilled already today- but can she take this while pregnant?

## 2017-10-07 NOTE — Telephone Encounter (Signed)
So I really defer these decisions to the OB/GYN because it is a gray zone. She needs to establish with them. Some OBs will continue this med and some will stop it. It is category C for pregnancy which means we just do not have enough good data yet. If she wants we can decrease her to 37.5 mg daily til she sees them to start titrating her off would take the lesser dose for 2 weeks before quitting

## 2017-10-07 NOTE — Telephone Encounter (Signed)
Noted  

## 2017-10-08 ENCOUNTER — Encounter: Payer: Self-pay | Admitting: Obstetrics

## 2017-10-08 ENCOUNTER — Other Ambulatory Visit (HOSPITAL_COMMUNITY)
Admission: RE | Admit: 2017-10-08 | Discharge: 2017-10-08 | Disposition: A | Discharge status: Home / Self Care | Payer: Medicaid Other | Source: Ambulatory Visit | Attending: Obstetrics | Admitting: Obstetrics

## 2017-10-08 ENCOUNTER — Ambulatory Visit (INDEPENDENT_AMBULATORY_CARE_PROVIDER_SITE_OTHER): Payer: Medicaid Other | Admitting: Obstetrics

## 2017-10-08 VITALS — BP 121/76 | HR 125 | Wt 133.3 lb

## 2017-10-08 DIAGNOSIS — Z3400 Encounter for supervision of normal first pregnancy, unspecified trimester: Secondary | ICD-10-CM | POA: Insufficient documentation

## 2017-10-08 DIAGNOSIS — O219 Vomiting of pregnancy, unspecified: Secondary | ICD-10-CM

## 2017-10-08 DIAGNOSIS — F418 Other specified anxiety disorders: Secondary | ICD-10-CM

## 2017-10-08 DIAGNOSIS — Z3401 Encounter for supervision of normal first pregnancy, first trimester: Secondary | ICD-10-CM | POA: Diagnosis not present

## 2017-10-08 MED ORDER — DOXYLAMINE-PYRIDOXINE 10-10 MG PO TBEC: DELAYED_RELEASE_TABLET | ORAL | 5 refills | Status: DC

## 2017-10-08 NOTE — Progress Notes (Signed)
Subjective:    Kathryn Randall is being seen today for her first obstetrical visit.  This is not a planned pregnancy. She is at [redacted]w[redacted]d gestation. Her obstetrical history is significant for none. Relationship with FOB: significant other, not living together. Patient does intend to breast feed. Pregnancy history fully reviewed.  The information documented in the HPI was reviewed and verified.  Menstrual History: OB History    Gravida  1   Para  0   Term  0   Preterm  0   AB  0   Living  0     SAB  0   TAB  0   Ectopic  0   Multiple  0   Live Births  0            Patient's last menstrual period was 07/20/2017.    Past Medical History:  Diagnosis Date  . Allergic state 06/28/2011  . Anemia 11/14/2011  . Anxiety   . Anxiety and depression 07/17/2011  . Back pain   . Cervical cancer screening 05/26/2012  . Dental infection 02/04/2016  . Depression    Pt. States "disorder is being called bipolar"  . Dysmenorrhea 05/26/2012  . Hypokalemia 11/14/2011  . Insomnia 02/21/2013  . Migraine 06/28/2011  . Neck strain 06/28/2012  . Overweight(278.02) 04/27/2012  . Preventative health care 06/28/2011  . Tachycardia 11/14/2011  . UTI (lower urinary tract infection) 08/18/2011    Past Surgical History:  Procedure Laterality Date  . WISDOM TOOTH EXTRACTION  27 yrs old     (Not in a hospital admission) Allergies  Allergen Reactions  . Ibuprofen     Racing heart- when used in large doses   . Morphine And Related Hives and Itching    Social History   Tobacco Use  . Smoking status: Never Smoker  . Smokeless tobacco: Never Used  . Tobacco comment: black and milds every once in awhile  Substance Use Topics  . Alcohol use: No    Family History  Problem Relation Age of Onset  . Migraines Mother   . Arthritis Mother   . Hearing loss Mother   . Menstrual problems Mother   . Migraines Father   . Obesity Maternal Grandmother   . Heart disease Maternal Grandmother    CHF  . Hypertension Maternal Grandmother   . Hearing loss Maternal Grandmother   . Arthritis Maternal Grandmother   . Menstrual problems Maternal Grandmother   . Diabetes Maternal Grandfather        type 2  . Obesity Maternal Grandfather   . Leukemia Paternal Grandfather   . Migraines Paternal Grandfather   . Cancer Paternal Grandfather        leukemia  . Ovarian cysts Paternal Aunt      Review of Systems Constitutional: negative for weight loss Gastrointestinal: negative for vomiting Genitourinary:negative for genital lesions and vaginal discharge and dysuria Musculoskeletal:negative for back pain Behavioral/Psych: negative for abusive relationship, depression, illegal drug usage and tobacco use    Objective:    BP 121/76   Pulse (!) 125   Wt 133 lb 4.8 oz (60.5 kg)   LMP 07/20/2017   BMI 22.88 kg/m  General Appearance:    Alert, cooperative, no distress, appears stated age  Head:    Normocephalic, without obvious abnormality, atraumatic  Eyes:    PERRL, conjunctiva/corneas clear, EOM's intact, fundi    benign, both eyes  Ears:    Normal TM's and external ear canals, both ears  Nose:   Nares normal, septum midline, mucosa normal, no drainage    or sinus tenderness  Throat:   Lips, mucosa, and tongue normal; teeth and gums normal  Neck:   Supple, symmetrical, trachea midline, no adenopathy;    thyroid:  no enlargement/tenderness/nodules; no carotid   bruit or JVD  Back:     Symmetric, no curvature, ROM normal, no CVA tenderness  Lungs:     Clear to auscultation bilaterally, respirations unlabored  Chest Wall:    No tenderness or deformity   Heart:    Regular rate and rhythm, S1 and S2 normal, no murmur, rub   or gallop  Breast Exam:    No tenderness, masses, or nipple abnormality  Abdomen:     Soft, non-tender, bowel sounds active all four quadrants,    no masses, no organomegaly  Genitalia:    Normal female without lesion, discharge or tenderness  Extremities:    Extremities normal, atraumatic, no cyanosis or edema  Pulses:   2+ and symmetric all extremities  Skin:   Skin color, texture, turgor normal, no rashes or lesions  Lymph nodes:   Cervical, supraclavicular, and axillary nodes normal  Neurologic:   CNII-XII intact, normal strength, sensation and reflexes    throughout      Lab Review Urine pregnancy test Labs reviewed yes Radiologic studies reviewed yes   Assessment:    Pregnancy at [redacted]w[redacted]d weeks    Plan:     1. Supervision of normal first pregnancy, antepartum Rx: - Cytology - PAP - Cervicovaginal ancillary only - Culture, OB Urine - Hemoglobinopathy evaluation - Obstetric Panel, Including HIV - HgB A1c - Vitamin D (25 hydroxy) - Enroll Patient in Babyscripts - Genetic Screening  2. Nausea and vomiting in pregnancy prior to [redacted] weeks gestation Rx: - Doxylamine-Pyridoxine (DICLEGIS) 10-10 MG TBEC; 1 tab in AM, 1 tab mid afternoon 2 tabs at bedtime. Max dose 4 tabs daily.  Dispense: 100 tablet; Refill: 5  3. Anxiety associated with depression - taking Effexor.  Has tried almost all other antidepressants, and Effexor is the only one that works.Marland KitchenMarland KitchenShe feels that she needs it.    Prenatal vitamins.  Counseling provided regarding continued use of seat belts, cessation of alcohol consumption, smoking or use of illicit drugs; infection precautions i.e., influenza/TDAP immunizations, toxoplasmosis,CMV, parvovirus, listeria and varicella; workplace safety, exercise during pregnancy; routine dental care, safe medications, sexual activity, hot tubs, saunas, pools, travel, caffeine use, fish and methlymercury, potential toxins, hair treatments, varicose veins Weight gain recommendations per IOM guidelines reviewed: underweight/BMI< 18.5--> gain 28 - 40 lbs; normal weight/BMI 18.5 - 24.9--> gain 25 - 35 lbs; overweight/BMI 25 - 29.9--> gain 15 - 25 lbs; obese/BMI >30->gain  11 - 20 lbs Problem list reviewed and updated. FIRST/CF mutation  testing/NIPT/QUAD SCREEN/fragile X/Ashkenazi Jewish population testing/Spinal muscular atrophy discussed: requested. Role of ultrasound in pregnancy discussed; fetal survey: requested. Amniocentesis discussed: not indicated.   Meds ordered this encounter  Medications  . Doxylamine-Pyridoxine (DICLEGIS) 10-10 MG TBEC    Sig: 1 tab in AM, 1 tab mid afternoon 2 tabs at bedtime. Max dose 4 tabs daily.    Dispense:  100 tablet    Refill:  5   Orders Placed This Encounter  Procedures  . Culture, OB Urine  . Hemoglobinopathy evaluation  . Obstetric Panel, Including HIV  . HgB A1c  . Vitamin D (25 hydroxy)  . Genetic Screening    Panorama    Follow up in 4 weeks. 50% of 20  min visit spent on counseling and coordination of care.     Shelly Bombard MD 10-08-2017

## 2017-10-08 NOTE — Progress Notes (Signed)
Patient is in the office for initial ob visit, unplanned pregnancy, fob is involved and living together. Pt states that she has lost weight b/c she has had a lot of nausea and vomiting.

## 2017-10-09 LAB — CERVICOVAGINAL ANCILLARY ONLY
Bacterial vaginitis: NEGATIVE
Candida vaginitis: POSITIVE — AB
Chlamydia: NEGATIVE
Neisseria Gonorrhea: NEGATIVE
Trichomonas: NEGATIVE

## 2017-10-10 ENCOUNTER — Other Ambulatory Visit: Payer: Self-pay

## 2017-10-10 ENCOUNTER — Other Ambulatory Visit: Payer: Self-pay | Admitting: Obstetrics

## 2017-10-10 DIAGNOSIS — B3731 Acute candidiasis of vulva and vagina: Secondary | ICD-10-CM

## 2017-10-10 DIAGNOSIS — B373 Candidiasis of vulva and vagina: Secondary | ICD-10-CM

## 2017-10-10 LAB — HEMOGLOBIN A1C
Est. average glucose Bld gHb Est-mCnc: 94 mg/dL
Hgb A1c MFr Bld: 4.9 % (ref 4.8–5.6)

## 2017-10-10 LAB — OBSTETRIC PANEL, INCLUDING HIV
ANTIBODY SCREEN: NEGATIVE
BASOS: 0 %
Basophils Absolute: 0 10*3/uL (ref 0.0–0.2)
EOS (ABSOLUTE): 0 10*3/uL (ref 0.0–0.4)
EOS: 0 %
HEMATOCRIT: 36.3 % (ref 34.0–46.6)
HEMOGLOBIN: 11.9 g/dL (ref 11.1–15.9)
HIV SCREEN 4TH GENERATION: NONREACTIVE
Hepatitis B Surface Ag: NEGATIVE
Immature Grans (Abs): 0 10*3/uL (ref 0.0–0.1)
Immature Granulocytes: 0 %
LYMPHS ABS: 2.2 10*3/uL (ref 0.7–3.1)
Lymphs: 35 %
MCH: 29.2 pg (ref 26.6–33.0)
MCHC: 32.8 g/dL (ref 31.5–35.7)
MCV: 89 fL (ref 79–97)
MONOS ABS: 0.5 10*3/uL (ref 0.1–0.9)
Monocytes: 8 %
NEUTROS ABS: 3.5 10*3/uL (ref 1.4–7.0)
Neutrophils: 57 %
Platelets: 383 10*3/uL — ABNORMAL HIGH (ref 150–379)
RBC: 4.07 x10E6/uL (ref 3.77–5.28)
RDW: 15.7 % — AB (ref 12.3–15.4)
RH TYPE: POSITIVE
RPR Ser Ql: NONREACTIVE
Rubella Antibodies, IGG: 3.37 index (ref >0.99)
WBC: 6.2 10*3/uL (ref 3.4–10.8)

## 2017-10-10 LAB — VITAMIN D 25 HYDROXY (VIT D DEFICIENCY, FRACTURES): Vit D, 25-Hydroxy: 25.4 ng/mL — ABNORMAL LOW (ref 30.0–100.0)

## 2017-10-10 LAB — HEMOGLOBINOPATHY EVALUATION
HEMOGLOBIN F QUANTITATION: 0 % (ref 0.0–2.0)
HGB A: 97.7 % (ref 96.4–98.8)
HGB C: 0 %
HGB S: 0 %
HGB VARIANT: 0 %
Hemoglobin A2 Quantitation: 2.3 % (ref 1.8–3.2)

## 2017-10-10 MED ORDER — TERCONAZOLE 0.8 % VA CREA: 1.0000 | TOPICAL_CREAM | Freq: Every day | VAGINAL | 0 refills | Status: DC

## 2017-10-10 NOTE — Progress Notes (Signed)
Rx was sent to pt pharm.  ordinally was sent to wrong pharmacy.

## 2017-10-11 LAB — URINE CULTURE, OB REFLEX

## 2017-10-11 LAB — CULTURE, OB URINE

## 2017-10-13 LAB — CYTOLOGY - PAP: Diagnosis: NEGATIVE

## 2017-10-13 NOTE — Telephone Encounter (Signed)
Called patient unable to leave voicemail, mailbox is full

## 2017-10-16 ENCOUNTER — Encounter: Payer: Self-pay | Admitting: Family Medicine

## 2017-10-20 ENCOUNTER — Encounter: Payer: Self-pay | Admitting: Obstetrics

## 2017-10-20 NOTE — Telephone Encounter (Signed)
Patient states the 37.5mg  of the Effexor is not doing well for her. I did explain to her because you had not seen her in over a year it would be difficult to continue to prescribe the 75 mg and also that she is pregnant she needed to consult her OB/GYN. I did call her several times, unable to leave voicemail because her mailbox was full.    She did state the 37.5 mg was not working for her   Please advise

## 2017-10-21 ENCOUNTER — Telehealth: Payer: Self-pay

## 2017-10-21 ENCOUNTER — Other Ambulatory Visit: Payer: Self-pay | Admitting: Obstetrics

## 2017-10-21 DIAGNOSIS — F419 Anxiety disorder, unspecified: Principal | ICD-10-CM

## 2017-10-21 DIAGNOSIS — F32A Depression, unspecified: Secondary | ICD-10-CM

## 2017-10-21 DIAGNOSIS — F329 Major depressive disorder, single episode, unspecified: Secondary | ICD-10-CM

## 2017-10-21 MED ORDER — VENLAFAXINE HCL ER 75 MG PO CP24: ORAL_CAPSULE | ORAL | 6 refills | Status: DC

## 2017-10-21 MED ORDER — VENLAFAXINE HCL ER 75 MG PO CP24: ORAL_CAPSULE | ORAL | 11 refills | Status: DC

## 2017-10-21 NOTE — Telephone Encounter (Signed)
Spoke with Lala Lund from [facility] triage nurse line. She states that pt fiance called during stating that pt was having suicidal thoughts. Randi did speak with patient, and pt denies having suicidal thoughts at this time. Pt is concerned that if she remains off of medication any longer that this will begin to happen. Pt is currently taking medication 3 capsules daily

## 2017-11-05 ENCOUNTER — Encounter: Payer: Self-pay | Admitting: Obstetrics

## 2017-11-05 ENCOUNTER — Ambulatory Visit (INDEPENDENT_AMBULATORY_CARE_PROVIDER_SITE_OTHER): Payer: Medicaid Other | Admitting: Obstetrics

## 2017-11-05 VITALS — BP 101/63 | HR 98 | Wt 138.8 lb

## 2017-11-05 DIAGNOSIS — Z3400 Encounter for supervision of normal first pregnancy, unspecified trimester: Secondary | ICD-10-CM

## 2017-11-05 DIAGNOSIS — Z3402 Encounter for supervision of normal first pregnancy, second trimester: Secondary | ICD-10-CM

## 2017-11-05 MED ORDER — VITAFOL ULTRA 29-0.6-0.4-200 MG PO CAPS: 1.0000 | ORAL_CAPSULE | Freq: Every day | ORAL | 4 refills | Status: DC

## 2017-11-05 NOTE — Progress Notes (Signed)
Subjective:  Kathryn Randall is a 27 y.o. G1P0000 at [redacted]w[redacted]d being seen today for ongoing prenatal care.  She is currently monitored for the following issues for this high-risk pregnancy and has Back pain; Migraine; Contraceptive management; Allergic state; Preventative health care; Anxiety and depression; Tachycardia; Anemia; Hypokalemia; Overweight; Cervical cancer screening; Cerumen impaction; Neck strain; Insomnia; Post concussive syndrome; Dental infection; and Supervision of normal first pregnancy, antepartum on their problem list.  Patient reports no complaints.  Contractions: Not present. Vag. Bleeding: None.  Movement: Present. Denies leaking of fluid.   The following portions of the patient's history were reviewed and updated as appropriate: allergies, current medications, past family history, past medical history, past social history, past surgical history and problem list. Problem list updated.  Objective:   Vitals:   11/05/17 1058  BP: 101/63  Pulse: 98  Weight: 138 lb 12.8 oz (63 kg)    Fetal Status: Fetal Heart Rate (bpm): 150   Movement: Present     General:  Alert, oriented and cooperative. Patient is in no acute distress.  Skin: Skin is warm and dry. No rash noted.   Cardiovascular: Normal heart rate noted  Respiratory: Normal respiratory effort, no problems with respiration noted  Abdomen: Soft, gravid, appropriate for gestational age. Pain/Pressure: Absent     Pelvic:  Cervical exam deferred        Extremities: Normal range of motion.  Edema: None  Mental Status: Normal mood and affect. Normal behavior. Normal judgment and thought content.   Urinalysis:      Assessment and Plan:  Pregnancy: G1P0000 at [redacted]w[redacted]d  1. Supervision of normal first pregnancy, antepartum Rx: - AFP, Serum, Open Spina Bifida - [place] MFM OB COMP + 14 WK; Future - Prenat-Fe Poly-Methfol-FA-DHA (VITAFOL ULTRA) 29-0.6-0.4-200 MG CAPS; Take 1 capsule by mouth daily before breakfast.  Dispense: 90  capsule; Refill: 4  Preterm labor symptoms and general obstetric precautions including but not limited to vaginal bleeding, contractions, leaking of fluid and fetal movement were reviewed in detail with the patient. Please refer to After Visit Summary for other counseling recommendations.  Return in about 1 month (around 12/03/2017) for [place].   Shelly Bombard, MD

## 2017-11-18 ENCOUNTER — Encounter (HOSPITAL_COMMUNITY): Payer: Self-pay

## 2017-11-19 LAB — AFP, SERUM, OPEN SPINA BIFIDA
AFP MoM: 1.06
AFP VALUE AFPOSL: 38.2 ng/mL
GEST. AGE ON COLLECTION DATE: 16.4 wk
Maternal Age At EDD: 27 yr
OSBR Risk 1 IN: 10000
Test Results:: NEGATIVE
WEIGHT: 138 [lb_av]

## 2017-11-26 ENCOUNTER — Other Ambulatory Visit: Payer: Self-pay | Admitting: Obstetrics

## 2017-11-26 ENCOUNTER — Ambulatory Visit (HOSPITAL_COMMUNITY)
Admission: RE | Admit: 2017-11-26 | Discharge: 2017-11-26 | Disposition: A | Discharge status: Home / Self Care | Payer: Medicaid Other | Source: Ambulatory Visit | Attending: Obstetrics | Admitting: Obstetrics

## 2017-11-26 DIAGNOSIS — Z3A19 19 weeks gestation of pregnancy: Secondary | ICD-10-CM

## 2017-11-26 DIAGNOSIS — Z3400 Encounter for supervision of normal first pregnancy, unspecified trimester: Secondary | ICD-10-CM

## 2017-11-26 DIAGNOSIS — Z3689 Encounter for other specified antenatal screening: Secondary | ICD-10-CM | POA: Diagnosis present

## 2017-12-03 ENCOUNTER — Ambulatory Visit (INDEPENDENT_AMBULATORY_CARE_PROVIDER_SITE_OTHER): Payer: Medicaid Other | Admitting: Obstetrics

## 2017-12-03 ENCOUNTER — Other Ambulatory Visit: Payer: Self-pay

## 2017-12-03 ENCOUNTER — Encounter: Payer: Self-pay | Admitting: Obstetrics

## 2017-12-03 VITALS — BP 107/67 | HR 102 | Wt 142.0 lb

## 2017-12-03 DIAGNOSIS — Z3402 Encounter for supervision of normal first pregnancy, second trimester: Secondary | ICD-10-CM

## 2017-12-03 DIAGNOSIS — O219 Vomiting of pregnancy, unspecified: Secondary | ICD-10-CM

## 2017-12-03 DIAGNOSIS — Z3400 Encounter for supervision of normal first pregnancy, unspecified trimester: Secondary | ICD-10-CM

## 2017-12-03 NOTE — Progress Notes (Signed)
ROB.  C/o NV

## 2017-12-03 NOTE — Progress Notes (Signed)
Subjective:  Kathryn Randall is a 27 y.o. G1P0000 at [redacted]w[redacted]d being seen today for ongoing prenatal care.  She is currently monitored for the following issues for this low-risk pregnancy and has Back pain; Migraine; Contraceptive management; Allergic state; Preventative health care; Anxiety and depression; Tachycardia; Anemia; Hypokalemia; Overweight; Cervical cancer screening; Cerumen impaction; Neck strain; Insomnia; Post concussive syndrome; Dental infection; and Supervision of normal first pregnancy, antepartum on their problem list.  Patient reports nausea and vomiting, and was doing well on Diclegis, but stopped takinng.  Contractions: Not present. Vag. Bleeding: None.  Movement: Present. Denies leaking of fluid.   The following portions of the patient's history were reviewed and updated as appropriate: allergies, current medications, past family history, past medical history, past social history, past surgical history and problem list. Problem list updated.  Objective:   Vitals:   12/03/17 1057  BP: 107/67  Pulse: (!) 102  Weight: 142 lb (64.4 kg)    Fetal Status: Fetal Heart Rate (bpm): 150   Movement: Present     General:  Alert, oriented and cooperative. Patient is in no acute distress.  Skin: Skin is warm and dry. No rash noted.   Cardiovascular: Normal heart rate noted  Respiratory: Normal respiratory effort, no problems with respiration noted  Abdomen: Soft, gravid, appropriate for gestational age. Pain/Pressure: Present     Pelvic:  Cervical exam deferred        Extremities: Normal range of motion.  Edema: Trace  Mental Status: Normal mood and affect. Normal behavior. Normal judgment and thought content.   Urinalysis:      Assessment and Plan:  Pregnancy: G1P0000 at [redacted]w[redacted]d  1. Supervision of normal first pregnancy, antepartum  2. Nausea and vomiting in pregnancy prior to [redacted] weeks gestation - resume Diclegis   Preterm labor symptoms and general obstetric precautions  including but not limited to vaginal bleeding, contractions, leaking of fluid and fetal movement were reviewed in detail with the patient. Please refer to After Visit Summary for other counseling recommendations.  Return in about 1 month (around 12/31/2017) for [place].   Shelly Bombard, MD

## 2017-12-17 ENCOUNTER — Encounter: Payer: Self-pay | Admitting: Family Medicine

## 2017-12-17 ENCOUNTER — Encounter: Payer: Self-pay | Admitting: *Deleted

## 2017-12-17 ENCOUNTER — Ambulatory Visit (INDEPENDENT_AMBULATORY_CARE_PROVIDER_SITE_OTHER): Payer: Medicaid Other | Admitting: Family Medicine

## 2017-12-17 VITALS — BP 120/66 | HR 103 | Temp 98.4°F | Resp 16 | Ht 64.0 in | Wt 143.4 lb

## 2017-12-17 DIAGNOSIS — J989 Respiratory disorder, unspecified: Secondary | ICD-10-CM

## 2017-12-17 DIAGNOSIS — J069 Acute upper respiratory infection, unspecified: Secondary | ICD-10-CM | POA: Diagnosis not present

## 2017-12-17 DIAGNOSIS — R0989 Other specified symptoms and signs involving the circulatory and respiratory systems: Secondary | ICD-10-CM | POA: Diagnosis not present

## 2017-12-17 DIAGNOSIS — Z3A22 22 weeks gestation of pregnancy: Secondary | ICD-10-CM | POA: Diagnosis not present

## 2017-12-17 MED ORDER — BECLOMETHASONE DIPROPIONATE 80 MCG/ACT NA AERS: 1.0000 | INHALATION_SPRAY | Freq: Two times a day (BID) | NASAL | 0 refills | Status: DC | PRN

## 2017-12-17 MED ORDER — ALBUTEROL SULFATE HFA 108 (90 BASE) MCG/ACT IN AERS
2.0000 | INHALATION_SPRAY | Freq: Four times a day (QID) | RESPIRATORY_TRACT | 0 refills | Status: DC | PRN
Start: 2017-12-17 — End: 2018-04-05

## 2017-12-17 NOTE — Patient Instructions (Addendum)
A few things to remember from today's visit:   Reactive airway disease that is not asthma - Plan: albuterol (PROVENTIL HFA;VENTOLIN HFA) 108 (90 Base) MCG/ACT inhaler  URI, acute - Plan: Beclomethasone Dipropionate (QNASL) 80 MCG/ACT AERS  viral infections are self-limited and we treat each symptom depending of severity.  Over the counter plain Mucinex may help.  Tylenol 100 mg 3 times per day if fever.  Plenty of fluids. Honey helps with cough. Steam inhalations helps with runny nose, nasal congestion, and may prevent sinus infections. Cough and nasal congestion could last a few days and sometimes weeks. Please follow in not any better in 1-2 weeks or if symptoms get worse.   Please contact your OB to be sure she/he agrees with treatment plan.

## 2017-12-17 NOTE — Progress Notes (Signed)
ACUTE VISIT   HPI:  Chief Complaint  Patient presents with  . URI    started 3 days ago  . Cough    with green phlem, took Robitussin DM  . Chest congestion  . Nasal Congestion    [name] is a 27 y.o. female, who is here today with her boyfriend complaining of 3 days of respiratory symptoms.  Productive cough with greenish sputum, denies hemoptysis. She is not sure about wheezing but her boyfriend has noted some. She has not checked temperature but has had some chills. Sore throat when coughing. Bilateral ear fullness sensation and occasional ear ache.  She is not sure about history of allergies but has had and inhaler in the past, usually when she has had URI's.  She has taking Robitussin-DM.  No sick contact or recent travel. Symptoms are otherwise stable.  She is [redacted] weeks pregnant. She denies urinary symptoms, abdominal pain, vaginal discharge or bleeding She is following with OB GYN.    Review of Systems  Constitutional: Positive for appetite change, chills and fatigue. Negative for activity change.  HENT: Positive for congestion, ear pain, postnasal drip, rhinorrhea, sinus pressure and sore throat. Negative for facial swelling, mouth sores, sneezing, trouble swallowing and voice change.   Eyes: Negative for discharge, redness and itching.  Respiratory: Positive for cough and wheezing. Negative for chest tightness and shortness of breath.   Gastrointestinal: Negative for abdominal pain, diarrhea, nausea and vomiting.  Genitourinary: Negative for decreased urine volume, dysuria, hematuria, vaginal bleeding and vaginal discharge.  Musculoskeletal: Negative for gait problem and myalgias.  Skin: Negative for rash.  Allergic/Immunologic: Positive for environmental allergies.  Neurological: Negative for syncope, weakness and headaches.  Hematological: Negative for adenopathy. Does not bruise/bleed easily.  Psychiatric/Behavioral: Negative for  confusion. The patient is nervous/anxious.       Current Outpatient Medications on File Prior to Visit  Medication Sig Dispense Refill  . Doxylamine-Pyridoxine (DICLEGIS) 10-10 MG TBEC 1 tab in AM, 1 tab mid afternoon 2 tabs at bedtime. Max dose 4 tabs daily. 100 tablet 5  . venlafaxine XR (EFFEXOR-XR) 75 MG 24 hr capsule TAKE 3 CAPSULES BY MOUTH DAILY WITH BREAKFAST 90 capsule 6   No current facility-administered medications on file prior to visit.      Past Medical History:  Diagnosis Date  . Allergic state 06/28/2011  . Anemia 11/14/2011  . Anxiety   . Anxiety and depression 07/17/2011  . Back pain   . Cervical cancer screening 05/26/2012  . Dental infection 02/04/2016  . Depression    Pt. States "disorder is being called bipolar"  . Dysmenorrhea 05/26/2012  . Hypokalemia 11/14/2011  . Insomnia 02/21/2013  . Migraine 06/28/2011  . Neck strain 06/28/2012  . Overweight(278.02) 04/27/2012  . Preventative health care 06/28/2011  . Tachycardia 11/14/2011  . UTI (lower urinary tract infection) 08/18/2011   Allergies  Allergen Reactions  . Ibuprofen     Racing heart- when used in large doses   . Morphine And Related Hives and Itching    Social History   Socioeconomic History  . Marital status: Single    Spouse name: Not on file  . Number of children: Not on file  . Years of education: Not on file  . Highest education level: Not on file  Occupational History  . Not on file  Social Needs  . Financial resource strain: Not on file  . Food insecurity:    Worry: Not on file  Inability: Not on file  . Transportation needs:    Medical: Not on file    Non-medical: Not on file  Tobacco Use  . Smoking status: Never Smoker  . Smokeless tobacco: Never Used  . Tobacco comment: black and milds every once in awhile  Substance and Sexual Activity  . Alcohol use: No  . Drug use: Not Currently    Comment: "they put me on hydrocodone for my periods."  . Sexual activity: Yes     Partners: Male  Lifestyle  . Physical activity:    Days per week: Not on file    Minutes per session: Not on file  . Stress: Not on file  Relationships  . Social connections:    Talks on phone: Not on file    Gets together: Not on file    Attends religious service: Not on file    Active member of club or organization: Not on file    Attends meetings of clubs or organizations: Not on file    Relationship status: Not on file  Other Topics Concern  . Not on file  Social History Narrative  . Not on file    Vitals:   12/17/17 1434  BP: 120/66  Pulse: (!) 103  Resp: 16  Temp: 98.4 F (36.9 C)  SpO2: 98%   Body mass index is 24.61 kg/m.   Physical Exam  Nursing note and vitals reviewed. Constitutional: She is oriented to person, place, and time. She appears well-developed. She does not appear ill. No distress.  HENT:  Head: Normocephalic and atraumatic.  Right Ear: External ear normal.  Left Ear: External ear and ear canal normal. Tympanic membrane is not erythematous. A middle ear effusion is present.  Nose: Rhinorrhea present. Right sinus exhibits no maxillary sinus tenderness and no frontal sinus tenderness. Left sinus exhibits no maxillary sinus tenderness and no frontal sinus tenderness.  Mouth/Throat: Oropharynx is clear and moist and mucous membranes are normal.  Cerumen excess left ear, I could not see TM. Nasal voice. Postnasal drainage.   Eyes: Conjunctivae are normal.  Cardiovascular: Normal rate and regular rhythm.  No murmur heard. HR by my count 96/min.  Respiratory: Effort normal. No stridor. No respiratory distress. She has wheezes (Mild at the end of expiration.). She has no rales.  Lymphadenopathy:       Head (right side): No submandibular adenopathy present.       Head (left side): No submandibular adenopathy present.    She has no cervical adenopathy.  Neurological: She is alert and oriented to person, place, and time. She has normal strength. Gait  normal.  Skin: Skin is warm. No rash noted. No erythema.  Psychiatric: She has a normal mood and affect.  Well groomed, good eye contact.    ASSESSMENT AND PLAN:  [name] was seen today for uri, cough, chest congestion and nasal congestion.  Diagnoses and all orders for this visit:  Reactive airway disease that is not asthma  Today auscultation otherwise normal except for mild wheezing. After discussion of some side effects, I recommended Albuterol inh 2 puff every 6 hours for a week then as needed for wheezing or shortness of breath.   -     albuterol (PROVENTIL HFA;VENTOLIN HFA) 108 (90 Base) MCG/ACT inhaler; Inhale 2 puffs into the lungs every 6 (six) hours as needed for wheezing or shortness of breath.  URI, acute  Symptoms suggests a viral etiology, I recommend symptomatic treatment,I do not think abx is needed at  this time. Instructed to monitor for signs of complications. I also explained that cough and nasal congestion can last a few days and sometimes weeks. Because pregnancy we do not have many options to treat nasal congestion and postnasal drainage, recommend Qnals nasal spray 1 spray twice daily as needed. Plain Mucinex may also help. Nasal saline irrigations as needed.  F/U as needed.   -     Beclomethasone Dipropionate (QNASL) 80 MCG/ACT AERS; Place 1 spray into the nose 2 (two) times daily as needed.  [redacted] weeks gestation of pregnancy  We discussed some side effects of medications and risk of adverse effects. Instructed to call her OB GYN to let them know medications that have been prescribed today. She was clearly instructed about warning signs. Recommend rest, plenty of fluids, Tylenol 500 mg 3 times daily as needed for fever.   Return if symptoms worsen or fail to improve.     Noga Fogg G. [place], MD  [facility]. [place] office.

## 2017-12-30 ENCOUNTER — Telehealth: Payer: Self-pay | Admitting: *Deleted

## 2017-12-30 NOTE — Telephone Encounter (Signed)
[place] [employer] forms for a prior auth for Qnasal and Albuterol inhaler were completed and faxed to [phone].

## 2017-12-31 ENCOUNTER — Ambulatory Visit (INDEPENDENT_AMBULATORY_CARE_PROVIDER_SITE_OTHER): Payer: Medicaid Other | Admitting: Obstetrics

## 2017-12-31 VITALS — BP 102/67 | HR 94 | Wt 146.8 lb

## 2017-12-31 DIAGNOSIS — Z3402 Encounter for supervision of normal first pregnancy, second trimester: Secondary | ICD-10-CM

## 2017-12-31 DIAGNOSIS — Z3400 Encounter for supervision of normal first pregnancy, unspecified trimester: Secondary | ICD-10-CM

## 2018-01-01 ENCOUNTER — Encounter: Payer: Self-pay | Admitting: Obstetrics

## 2018-01-01 NOTE — Progress Notes (Signed)
Subjective:  Kathryn Randall is a 27 y.o. G1P0000 at [redacted]w[redacted]d being seen today for ongoing prenatal care.  She is currently monitored for the following issues for this low-risk pregnancy and has Back pain; Migraine; Contraceptive management; Allergic state; Preventative health care; Anxiety and depression; Tachycardia; Anemia; Hypokalemia; Overweight; Cervical cancer screening; Cerumen impaction; Neck strain; Insomnia; Post concussive syndrome; Dental infection; and Supervision of normal first pregnancy, antepartum on their problem list.  Patient reports no complaints.  Contractions: Not present. Vag. Bleeding: None.  Movement: Present. Denies leaking of fluid.   The following portions of the patient's history were reviewed and updated as appropriate: allergies, current medications, past family history, past medical history, past social history, past surgical history and problem list. Problem list updated.  Objective:   Vitals:   12/31/17 1023  BP: 102/67  Pulse: 94  Weight: 146 lb 12.8 oz (66.6 kg)    Fetal Status: Fetal Heart Rate (bpm): 150   Movement: Present     General:  Alert, oriented and cooperative. Patient is in no acute distress.  Skin: Skin is warm and dry. No rash noted.   Cardiovascular: Normal heart rate noted  Respiratory: Normal respiratory effort, no problems with respiration noted  Abdomen: Soft, gravid, appropriate for gestational age. Pain/Pressure: Absent     Pelvic:  Cervical exam deferred        Extremities: Normal range of motion.  Edema: None  Mental Status: Normal mood and affect. Normal behavior. Normal judgment and thought content.   Urinalysis:      Assessment and Plan:  Pregnancy: G1P0000 at [redacted]w[redacted]d  1. Supervision of normal first pregnancy, antepartum   Preterm labor symptoms and general obstetric precautions including but not limited to vaginal bleeding, contractions, leaking of fluid and fetal movement were reviewed in detail with the  patient. Please refer to After Visit Summary for other counseling recommendations.  Return in about 1 month (around 01/28/2018) for 1 month string check, 2 hour OGTT.   Shelly Bombard, MD

## 2018-01-29 ENCOUNTER — Encounter: Payer: Self-pay | Admitting: Obstetrics

## 2018-01-29 ENCOUNTER — Other Ambulatory Visit: Payer: Self-pay | Admitting: Obstetrics

## 2018-01-29 ENCOUNTER — Other Ambulatory Visit: Payer: Medicaid Other

## 2018-01-29 ENCOUNTER — Ambulatory Visit (INDEPENDENT_AMBULATORY_CARE_PROVIDER_SITE_OTHER): Payer: Medicaid Other | Admitting: Obstetrics

## 2018-01-29 VITALS — BP 126/82 | HR 109 | Wt 150.9 lb

## 2018-01-29 DIAGNOSIS — Z3400 Encounter for supervision of normal first pregnancy, unspecified trimester: Secondary | ICD-10-CM

## 2018-01-29 DIAGNOSIS — M5441 Lumbago with sciatica, right side: Secondary | ICD-10-CM

## 2018-01-29 DIAGNOSIS — Z131 Encounter for screening for diabetes mellitus: Secondary | ICD-10-CM

## 2018-01-29 DIAGNOSIS — Z3403 Encounter for supervision of normal first pregnancy, third trimester: Secondary | ICD-10-CM

## 2018-01-29 MED ORDER — COMFORT FIT MATERNITY SUPP SM MISC: 0 refills | Status: DC

## 2018-01-29 NOTE — Progress Notes (Signed)
Pt vomited glucose this morning.

## 2018-01-29 NOTE — Progress Notes (Signed)
Subjective:  Kathryn Randall is a 27 y.o. G1P0000 at [redacted]w[redacted]d being seen today for ongoing prenatal care.  She is currently monitored for the following issues for this low-risk pregnancy and has Back pain; Migraine; Contraceptive management; Allergic state; Preventative health care; Anxiety and depression; Tachycardia; Anemia; Hypokalemia; Overweight; Cervical cancer screening; Cerumen impaction; Neck strain; Insomnia; Post concussive syndrome; Dental infection; and Supervision of normal first pregnancy, antepartum on their problem list.  Patient reports backache.  Contractions: Not present. Vag. Bleeding: None.  Movement: Present. Denies leaking of fluid.   The following portions of the patient's history were reviewed and updated as appropriate: allergies, current medications, past family history, past medical history, past social history, past surgical history and problem list. Problem list updated.  Objective:   Vitals:   01/29/18 0855  BP: 126/82  Pulse: (!) 109  Weight: 150 lb 14.4 oz (68.4 kg)    Fetal Status: Fetal Heart Rate (bpm): 150   Movement: Present     General:  Alert, oriented and cooperative. Patient is in no acute distress.  Skin: Skin is warm and dry. No rash noted.   Cardiovascular: Normal heart rate noted  Respiratory: Normal respiratory effort, no problems with respiration noted  Abdomen: Soft, gravid, appropriate for gestational age. Pain/Pressure: Absent     Pelvic:  Cervical exam deferred        Extremities: Normal range of motion.  Edema: None  Mental Status: Normal mood and affect. Normal behavior. Normal judgment and thought content.   Urinalysis:      Assessment and Plan:  Pregnancy: G1P0000 at [redacted]w[redacted]d  1. Supervision of normal first pregnancy, antepartum  2. Acute midline low back pain with right-sided sciatica Rx: - Elastic Bandages & Supports (COMFORT FIT MATERNITY SUPP SM) MISC; Wear as directed.  Dispense: 1 each; Refill: 0  Preterm labor symptoms  and general obstetric precautions including but not limited to vaginal bleeding, contractions, leaking of fluid and fetal movement were reviewed in detail with the patient. Please refer to After Visit Summary for other counseling recommendations.  Return in about 2 weeks (around 02/12/2018) for ROB.   Shelly Bombard, MD

## 2018-01-30 ENCOUNTER — Other Ambulatory Visit: Payer: Self-pay | Admitting: Obstetrics

## 2018-01-30 LAB — HEMOGLOBIN A1C
ESTIMATED AVERAGE GLUCOSE: 97 mg/dL
HEMOGLOBIN A1C: 5 % (ref 4.8–5.6)

## 2018-01-30 LAB — CBC
HEMOGLOBIN: 10.4 g/dL — AB (ref 11.1–15.9)
Hematocrit: 30.6 % — ABNORMAL LOW (ref 34.0–46.6)
MCH: 30.1 pg (ref 26.6–33.0)
MCHC: 34 g/dL (ref 31.5–35.7)
MCV: 88 fL (ref 79–97)
Platelets: 296 10*3/uL (ref 150–450)
RBC: 3.46 x10E6/uL — ABNORMAL LOW (ref 3.77–5.28)
RDW: 14.9 % (ref 12.3–15.4)
WBC: 7.2 10*3/uL (ref 3.4–10.8)

## 2018-01-30 LAB — HIV ANTIBODY (ROUTINE TESTING W REFLEX): HIV Screen 4th Generation wRfx: NONREACTIVE

## 2018-01-30 LAB — RPR: RPR Ser Ql: NONREACTIVE

## 2018-02-12 ENCOUNTER — Encounter: Payer: Self-pay | Admitting: Obstetrics

## 2018-02-12 ENCOUNTER — Ambulatory Visit (INDEPENDENT_AMBULATORY_CARE_PROVIDER_SITE_OTHER): Payer: Medicaid Other | Admitting: Obstetrics

## 2018-02-12 VITALS — BP 101/68 | HR 105 | Wt 153.2 lb

## 2018-02-12 DIAGNOSIS — Z3403 Encounter for supervision of normal first pregnancy, third trimester: Secondary | ICD-10-CM

## 2018-02-12 DIAGNOSIS — Z3400 Encounter for supervision of normal first pregnancy, unspecified trimester: Secondary | ICD-10-CM

## 2018-02-12 NOTE — Progress Notes (Signed)
Subjective:  Kathryn Randall is a 27 y.o. G1P0000 at [redacted]w[redacted]d being seen today for ongoing prenatal care.  She is currently monitored for the following issues for this low-risk pregnancy and has Back pain; Migraine; Contraceptive management; Allergic state; Preventative health care; Anxiety and depression; Tachycardia; Anemia; Hypokalemia; Overweight; Cervical cancer screening; Cerumen impaction; Neck strain; Insomnia; Post concussive syndrome; Dental infection; and Supervision of normal first pregnancy, antepartum on their problem list.  Patient reports no complaints.  Contractions: Not present. Vag. Bleeding: None.  Movement: Present. Denies leaking of fluid.   The following portions of the patient's history were reviewed and updated as appropriate: allergies, current medications, past family history, past medical history, past social history, past surgical history and problem list. Problem list updated.  Objective:   Vitals:   02/12/18 1126  BP: 101/68  Pulse: (!) 105  Weight: 153 lb 3.2 oz (69.5 kg)    Fetal Status: Fetal Heart Rate (bpm): 150   Movement: Present     General:  Alert, oriented and cooperative. Patient is in no acute distress.  Skin: Skin is warm and dry. No rash noted.   Cardiovascular: Normal heart rate noted  Respiratory: Normal respiratory effort, no problems with respiration noted  Abdomen: Soft, gravid, appropriate for gestational age. Pain/Pressure: Absent     Pelvic:  Cervical exam deferred        Extremities: Normal range of motion.  Edema: None  Mental Status: Normal mood and affect. Normal behavior. Normal judgment and thought content.   Urinalysis:      Assessment and Plan:  Pregnancy: G1P0000 at [redacted]w[redacted]d  1. Supervision of normal first pregnancy, antepartum   Preterm labor symptoms and general obstetric precautions including but not limited to vaginal bleeding, contractions, leaking of fluid and fetal movement were reviewed in detail with the  patient. Please refer to After Visit Summary for other counseling recommendations.  Return in about 2 weeks (around 02/26/2018) for ROB.   Shelly Bombard, MD

## 2018-02-12 NOTE — Progress Notes (Signed)
Pt is G28P0 [redacted]w[redacted]d here for ROB.

## 2018-02-26 ENCOUNTER — Encounter: Payer: Medicaid Other | Admitting: Obstetrics

## 2018-03-03 ENCOUNTER — Ambulatory Visit (INDEPENDENT_AMBULATORY_CARE_PROVIDER_SITE_OTHER): Payer: Medicaid Other | Admitting: Obstetrics

## 2018-03-03 ENCOUNTER — Encounter: Payer: Self-pay | Admitting: Obstetrics

## 2018-03-03 VITALS — BP 113/75 | HR 96 | Wt 160.4 lb

## 2018-03-03 DIAGNOSIS — O26899 Other specified pregnancy related conditions, unspecified trimester: Secondary | ICD-10-CM

## 2018-03-03 DIAGNOSIS — O26893 Other specified pregnancy related conditions, third trimester: Secondary | ICD-10-CM

## 2018-03-03 DIAGNOSIS — B373 Candidiasis of vulva and vagina: Secondary | ICD-10-CM

## 2018-03-03 DIAGNOSIS — Z3400 Encounter for supervision of normal first pregnancy, unspecified trimester: Secondary | ICD-10-CM

## 2018-03-03 DIAGNOSIS — R12 Heartburn: Secondary | ICD-10-CM

## 2018-03-03 DIAGNOSIS — B3731 Acute candidiasis of vulva and vagina: Secondary | ICD-10-CM

## 2018-03-03 DIAGNOSIS — Z3403 Encounter for supervision of normal first pregnancy, third trimester: Secondary | ICD-10-CM

## 2018-03-03 MED ORDER — TERCONAZOLE 0.8 % VA CREA: 1.0000 | TOPICAL_CREAM | Freq: Every day | VAGINAL | 0 refills | Status: DC

## 2018-03-03 MED ORDER — OMEPRAZOLE 20 MG PO CPDR: 20.0000 mg | DELAYED_RELEASE_CAPSULE | Freq: Two times a day (BID) | ORAL | 5 refills | Status: DC

## 2018-03-03 NOTE — Progress Notes (Signed)
Subjective:  Kathryn Randall is a 27 y.o. G1P0000 at [redacted]w[redacted]d being seen today for ongoing prenatal care.  She is currently monitored for the following issues for this low-risk pregnancy and has Back pain; Migraine; Contraceptive management; Allergic state; Preventative health care; Anxiety and depression; Tachycardia; Anemia; Hypokalemia; Overweight; Cervical cancer screening; Cerumen impaction; Neck strain; Insomnia; Post concussive syndrome; Dental infection; and Supervision of normal first pregnancy, antepartum on their problem list.  Patient reports heartburn and vaginal irritation.  Contractions: Not present. Vag. Bleeding: None.  Movement: Present. Denies leaking of fluid.   The following portions of the patient's history were reviewed and updated as appropriate: allergies, current medications, past family history, past medical history, past social history, past surgical history and problem list. Problem list updated.  Objective:   Vitals:   03/03/18 0935  BP: 113/75  Pulse: 96  Weight: 160 lb 6.4 oz (72.8 kg)    Fetal Status: Fetal Heart Rate (bpm): 150   Movement: Present     General:  Alert, oriented and cooperative. Patient is in no acute distress.  Skin: Skin is warm and dry. No rash noted.   Cardiovascular: Normal heart rate noted  Respiratory: Normal respiratory effort, no problems with respiration noted  Abdomen: Soft, gravid, appropriate for gestational age. Pain/Pressure: Absent     Pelvic:  Cervical exam deferred        Extremities: Normal range of motion.  Edema: None  Mental Status: Normal mood and affect. Normal behavior. Normal judgment and thought content.   Urinalysis:      Assessment and Plan:  Pregnancy: G1P0000 at [redacted]w[redacted]d  1. Supervision of normal first pregnancy, antepartum Rx: - Culture, OB Urine  2. Heartburn during pregnancy, antepartum Rx: - omeprazole (PRILOSEC) 20 MG capsule; Take 1 capsule (20 mg total) by mouth 2 (two) times daily before a meal.   Dispense: 60 capsule; Refill: 5  3. Candida vaginitis Rx: - terconazole (TERAZOL 3) 0.8 % vaginal cream; Place 1 applicator vaginally at bedtime.  Dispense: 20 g; Refill: 0  Preterm labor symptoms and general obstetric precautions including but not limited to vaginal bleeding, contractions, leaking of fluid and fetal movement were reviewed in detail with the patient. Please refer to After Visit Summary for other counseling recommendations.  Return in about 2 weeks (around 03/17/2018) for ROB.   Shelly Bombard, MD

## 2018-03-03 NOTE — Progress Notes (Signed)
ROB w/ complaints of possible yeast infection and UTI  Declines Exam today would like Rx sent / also advised she can try OTC monistat 7 day.

## 2018-03-07 LAB — URINE CULTURE, OB REFLEX

## 2018-03-07 LAB — CULTURE, OB URINE

## 2018-03-17 ENCOUNTER — Ambulatory Visit (INDEPENDENT_AMBULATORY_CARE_PROVIDER_SITE_OTHER): Payer: Medicaid Other | Admitting: Obstetrics

## 2018-03-17 ENCOUNTER — Encounter: Payer: Self-pay | Admitting: Obstetrics

## 2018-03-17 VITALS — BP 108/74 | HR 84 | Wt 162.2 lb

## 2018-03-17 DIAGNOSIS — Z3403 Encounter for supervision of normal first pregnancy, third trimester: Secondary | ICD-10-CM

## 2018-03-17 DIAGNOSIS — Z3400 Encounter for supervision of normal first pregnancy, unspecified trimester: Secondary | ICD-10-CM

## 2018-03-17 DIAGNOSIS — F419 Anxiety disorder, unspecified: Secondary | ICD-10-CM

## 2018-03-17 DIAGNOSIS — F329 Major depressive disorder, single episode, unspecified: Secondary | ICD-10-CM

## 2018-03-17 DIAGNOSIS — F32A Depression, unspecified: Secondary | ICD-10-CM

## 2018-03-17 MED ORDER — BUTALBITAL-APAP-CAFFEINE 50-325-40 MG PO TABS: 2.0000 | ORAL_TABLET | Freq: Four times a day (QID) | ORAL | 1 refills | Status: DC | PRN

## 2018-03-17 NOTE — Addendum Note (Signed)
Addended by: Baltazar Najjar A on: 03/17/2018 02:41 PM   Modules accepted: Miquel Dunn

## 2018-03-17 NOTE — Progress Notes (Signed)
Subjective:  Kathryn Randall is a 27 y.o. G1P0000 at [redacted]w[redacted]d being seen today for ongoing prenatal care.  She is currently monitored for the following issues for this low-risk pregnancy and has Back pain; Migraine; Contraceptive management; Allergic state; Preventative health care; Anxiety and depression; Tachycardia; Anemia; Hypokalemia; Overweight; Cervical cancer screening; Cerumen impaction; Neck strain; Insomnia; Post concussive syndrome; Dental infection; and Supervision of normal first pregnancy, antepartum on their problem list.  Patient reports no complaints.  Contractions: Not present. Vag. Bleeding: None.  Movement: Present. Denies leaking of fluid.   The following portions of the patient's history were reviewed and updated as appropriate: allergies, current medications, past family history, past medical history, past social history, past surgical history and problem list. Problem list updated.  Objective:   Vitals:   03/17/18 1056  BP: 108/74  Pulse: 84  Weight: 162 lb 3.2 oz (73.6 kg)    Fetal Status: Fetal Heart Rate (bpm): 150   Movement: Present     General:  Alert, oriented and cooperative. Patient is in no acute distress.  Skin: Skin is warm and dry. No rash noted.   Cardiovascular: Normal heart rate noted  Respiratory: Normal respiratory effort, no problems with respiration noted  Abdomen: Soft, gravid, appropriate for gestational age. Pain/Pressure: Absent     Pelvic:  Cervical exam deferred        Extremities: Normal range of motion.  Edema: None  Mental Status: Normal mood and affect. Normal behavior. Normal judgment and thought content.   Urinalysis:      Assessment and Plan:  Pregnancy: G1P0000 at [redacted]w[redacted]d  1. Supervision of normal first pregnancy, antepartum  2. Anxiety and depression   Preterm labor symptoms and general obstetric precautions including but not limited to vaginal bleeding, contractions, leaking of fluid and fetal movement were reviewed in  detail with the patient. Please refer to After Visit Summary for other counseling recommendations.  Return in about 1 week (around 03/24/2018) for ROB.   Shelly Bombard, MD

## 2018-03-17 NOTE — Progress Notes (Signed)
Patient reports good fetal movement, denies pain. 

## 2018-03-24 ENCOUNTER — Ambulatory Visit (INDEPENDENT_AMBULATORY_CARE_PROVIDER_SITE_OTHER): Payer: Medicaid Other | Admitting: Obstetrics

## 2018-03-24 ENCOUNTER — Other Ambulatory Visit: Payer: Self-pay

## 2018-03-24 ENCOUNTER — Encounter: Payer: Self-pay | Admitting: Obstetrics

## 2018-03-24 ENCOUNTER — Other Ambulatory Visit (HOSPITAL_COMMUNITY)
Admission: RE | Admit: 2018-03-24 | Discharge: 2018-03-24 | Disposition: A | Discharge status: Home / Self Care | Payer: Medicaid Other | Source: Ambulatory Visit | Attending: Obstetrics | Admitting: Obstetrics

## 2018-03-24 VITALS — BP 115/74 | HR 92 | Wt 161.7 lb

## 2018-03-24 DIAGNOSIS — N898 Other specified noninflammatory disorders of vagina: Secondary | ICD-10-CM | POA: Diagnosis present

## 2018-03-24 DIAGNOSIS — Z3403 Encounter for supervision of normal first pregnancy, third trimester: Secondary | ICD-10-CM

## 2018-03-24 DIAGNOSIS — Z3400 Encounter for supervision of normal first pregnancy, unspecified trimester: Secondary | ICD-10-CM

## 2018-03-24 NOTE — Progress Notes (Signed)
Subjective:  Kathryn Randall is a 27 y.o. G1P0000 at [redacted]w[redacted]d being seen today for ongoing prenatal care.  She is currently monitored for the following issues for this low-risk pregnancy and has Back pain; Migraine; Contraceptive management; Allergic state; Preventative health care; Anxiety and depression; Tachycardia; Anemia; Hypokalemia; Overweight; Cervical cancer screening; Cerumen impaction; Neck strain; Insomnia; Post concussive syndrome; Dental infection; and Supervision of normal first pregnancy, antepartum on their problem list.  Patient reports no complaints.  Contractions: Irritability. Vag. Bleeding: None.  Movement: Present. Denies leaking of fluid.   The following portions of the patient's history were reviewed and updated as appropriate: allergies, current medications, past family history, past medical history, past social history, past surgical history and problem list. Problem list updated.  Objective:   Vitals:   03/24/18 1101  BP: 115/74  Pulse: 92  Weight: 161 lb 11.2 oz (73.3 kg)    Fetal Status:     Movement: Present     General:  Alert, oriented and cooperative. Patient is in no acute distress.  Skin: Skin is warm and dry. No rash noted.   Cardiovascular: Normal heart rate noted  Respiratory: Normal respiratory effort, no problems with respiration noted  Abdomen: Soft, gravid, appropriate for gestational age. Pain/Pressure: Present     Pelvic:  Cervical exam deferred        Extremities: Normal range of motion.  Edema: None  Mental Status: Normal mood and affect. Normal behavior. Normal judgment and thought content.   Urinalysis:      Assessment and Plan:  Pregnancy: G1P0000 at [redacted]w[redacted]d  1. Supervision of normal first pregnancy, antepartum Rx: - Strep Gp B NAA  2. Vaginal discharge Rx - Cervicovaginal ancillary only  Preterm labor symptoms and general obstetric precautions including but not limited to vaginal bleeding, contractions, leaking of fluid and fetal  movement were reviewed in detail with the patient. Please refer to After Visit Summary for other counseling recommendations.  Return in about 1 week (around 03/31/2018) for ROB.   Shelly Bombard, MD

## 2018-03-24 NOTE — Progress Notes (Signed)
ROB.  C/o of pressure, she did not pick up [facility].

## 2018-03-25 ENCOUNTER — Other Ambulatory Visit: Payer: Self-pay | Admitting: Obstetrics

## 2018-03-25 DIAGNOSIS — A749 Chlamydial infection, unspecified: Secondary | ICD-10-CM

## 2018-03-25 LAB — CERVICOVAGINAL ANCILLARY ONLY
Bacterial vaginitis: NEGATIVE
Candida vaginitis: NEGATIVE
Chlamydia: POSITIVE — AB
Neisseria Gonorrhea: NEGATIVE
TRICH (WINDOWPATH): NEGATIVE

## 2018-03-25 MED ORDER — AZITHROMYCIN 500 MG PO TABS: 1000.0000 mg | ORAL_TABLET | Freq: Once | ORAL | 0 refills | Status: AC

## 2018-03-25 MED ORDER — CEFIXIME 400 MG PO CAPS: 400.0000 mg | ORAL_CAPSULE | Freq: Once | ORAL | 0 refills | Status: AC

## 2018-03-26 ENCOUNTER — Telehealth: Payer: Self-pay

## 2018-03-26 LAB — STREP GP B NAA: STREP GROUP B AG: NEGATIVE

## 2018-03-26 NOTE — Telephone Encounter (Signed)
PA approved for Cefixime 400 mg  PA #[id number]

## 2018-04-01 ENCOUNTER — Encounter: Payer: Self-pay | Admitting: Obstetrics

## 2018-04-01 ENCOUNTER — Ambulatory Visit (INDEPENDENT_AMBULATORY_CARE_PROVIDER_SITE_OTHER): Payer: Medicaid Other | Admitting: Obstetrics

## 2018-04-01 ENCOUNTER — Other Ambulatory Visit: Payer: Self-pay

## 2018-04-01 VITALS — BP 114/78 | HR 96 | Wt 162.2 lb

## 2018-04-01 DIAGNOSIS — Z3400 Encounter for supervision of normal first pregnancy, unspecified trimester: Secondary | ICD-10-CM

## 2018-04-01 DIAGNOSIS — Z3403 Encounter for supervision of normal first pregnancy, third trimester: Secondary | ICD-10-CM

## 2018-04-01 NOTE — Progress Notes (Signed)
ROB.  No complaints today.

## 2018-04-01 NOTE — Progress Notes (Signed)
Subjective:  Kathryn Randall is a 27 y.o. G1P0000 at [redacted]w[redacted]d being seen today for ongoing prenatal care.  She is currently monitored for the following issues for this low-risk pregnancy and has Back pain; Migraine; Contraceptive management; Allergic state; Preventative health care; Anxiety and depression; Tachycardia; Anemia; Hypokalemia; Overweight; Cervical cancer screening; Cerumen impaction; Neck strain; Insomnia; Post concussive syndrome; Dental infection; and Supervision of normal first pregnancy, antepartum on their problem list.  Patient reports no complaints.  Contractions: Not present. Vag. Bleeding: None.  Movement: Present. Denies leaking of fluid.   The following portions of the patient's history were reviewed and updated as appropriate: allergies, current medications, past family history, past medical history, past social history, past surgical history and problem list. Problem list updated.  Objective:   Vitals:   04/01/18 1101  BP: 114/78  Pulse: 96  Weight: 162 lb 3.2 oz (73.6 kg)    Fetal Status: Fetal Heart Rate (bpm): 150   Movement: Present     General:  Alert, oriented and cooperative. Patient is in no acute distress.  Skin: Skin is warm and dry. No rash noted.   Cardiovascular: Normal heart rate noted  Respiratory: Normal respiratory effort, no problems with respiration noted  Abdomen: Soft, gravid, appropriate for gestational age. Pain/Pressure: Absent     Pelvic:  Cervical exam deferred        Extremities: Normal range of motion.  Edema: None  Mental Status: Normal mood and affect. Normal behavior. Normal judgment and thought content.   Urinalysis:      Assessment and Plan:  Pregnancy: G1P0000 at [redacted]w[redacted]d  1. Supervision of normal first pregnancy, antepartum   Term labor symptoms and general obstetric precautions including but not limited to vaginal bleeding, contractions, leaking of fluid and fetal movement were reviewed in detail with the patient. Please  refer to After Visit Summary for other counseling recommendations.  Return in about 1 week (around 04/08/2018) for ROB.   Shelly Bombard, MD

## 2018-04-03 ENCOUNTER — Inpatient Hospital Stay (HOSPITAL_COMMUNITY)
Admission: AD | Admit: 2018-04-03 | Discharge: 2018-04-06 | DRG: 807 | Disposition: A | Discharge status: Home / Self Care | Payer: Medicaid Other | Attending: Obstetrics & Gynecology | Admitting: Obstetrics & Gynecology

## 2018-04-03 ENCOUNTER — Other Ambulatory Visit: Payer: Self-pay

## 2018-04-03 ENCOUNTER — Encounter (HOSPITAL_COMMUNITY): Payer: Self-pay

## 2018-04-03 DIAGNOSIS — Z23 Encounter for immunization: Secondary | ICD-10-CM | POA: Diagnosis not present

## 2018-04-03 DIAGNOSIS — Z3A37 37 weeks gestation of pregnancy: Secondary | ICD-10-CM

## 2018-04-03 DIAGNOSIS — Z3400 Encounter for supervision of normal first pregnancy, unspecified trimester: Secondary | ICD-10-CM

## 2018-04-03 DIAGNOSIS — O4202 Full-term premature rupture of membranes, onset of labor within 24 hours of rupture: Secondary | ICD-10-CM | POA: Diagnosis not present

## 2018-04-03 DIAGNOSIS — O4292 Full-term premature rupture of membranes, unspecified as to length of time between rupture and onset of labor: Secondary | ICD-10-CM | POA: Diagnosis present

## 2018-04-03 LAB — CBC
HCT: 31.3 % — ABNORMAL LOW (ref 36.0–46.0)
Hemoglobin: 10.3 g/dL — ABNORMAL LOW (ref 12.0–15.0)
MCH: 27.4 pg (ref 26.0–34.0)
MCHC: 32.9 g/dL (ref 30.0–36.0)
MCV: 83.2 fL (ref 80.0–100.0)
NRBC: 0 % (ref 0.0–0.2)
PLATELETS: 237 10*3/uL (ref 150–400)
RBC: 3.76 MIL/uL — AB (ref 3.87–5.11)
RDW: 14.8 % (ref 11.5–15.5)
WBC: 10.6 10*3/uL — ABNORMAL HIGH (ref 4.0–10.5)

## 2018-04-03 LAB — POCT FERN TEST: POCT Fern Test: POSITIVE

## 2018-04-03 MED ORDER — ACETAMINOPHEN 325 MG PO TABS: 650.0000 mg | ORAL_TABLET | ORAL | Status: DC | PRN

## 2018-04-03 MED ORDER — LACTATED RINGERS IV SOLN
INTRAVENOUS | Status: DC
  Administered 2018-04-03 – 2018-04-04 (×3): via INTRAVENOUS

## 2018-04-03 MED ORDER — SOD CITRATE-CITRIC ACID 500-334 MG/5ML PO SOLN: 30.0000 mL | ORAL | Status: DC | PRN

## 2018-04-03 MED ORDER — LACTATED RINGERS IV SOLN
500.0000 mL | INTRAVENOUS | Status: DC | PRN
  Administered 2018-04-04: 500 mL via INTRAVENOUS

## 2018-04-03 MED ORDER — LIDOCAINE HCL (PF) 1 % IJ SOLN
30.0000 mL | INTRAMUSCULAR | Status: DC | PRN
  Filled 2018-04-03: qty 30

## 2018-04-03 MED ORDER — OXYTOCIN BOLUS FROM INFUSION
500.0000 mL | Freq: Once | INTRAVENOUS | Status: AC
  Administered 2018-04-04: 500 mL via INTRAVENOUS

## 2018-04-03 MED ORDER — ONDANSETRON HCL 4 MG/2ML IJ SOLN
4.0000 mg | Freq: Four times a day (QID) | INTRAMUSCULAR | Status: DC | PRN
  Administered 2018-04-04: 4 mg via INTRAVENOUS
  Filled 2018-04-03: qty 2

## 2018-04-03 MED ORDER — OXYTOCIN 40 UNITS IN LACTATED RINGERS INFUSION - SIMPLE MED
2.5000 [IU]/h | INTRAVENOUS | Status: DC
  Filled 2018-04-03: qty 1000

## 2018-04-03 NOTE — MAU Note (Signed)
BS called and they will take pt directly to [facility] since fern positive and MAU busy. Pt to 169 via w/c

## 2018-04-03 NOTE — MAU Note (Signed)
LOF since 1800. Clear fld. Some abd cramping. Leaks fld everytime I move.

## 2018-04-03 NOTE — H&P (Signed)
Kathryn Randall is a 27 y.o. female G1P0000 @[redacted]w[redacted]d  presenting for PROM with onset of early labor following PROM at 1800 on 04/03/18.  Pregnancy has been uncomplicated.    Nursing Staff Provider  Office Location [facility] Dating  U/S  Language  English Anatomy US  11-26-17  Flu Vaccine  Declined 03-17-18 Genetic Screen  NIPS:   AFP:   First Screen:  Quad:    TDaP vaccine  Decline 03-24-18 Hgb A1C or  GTT Early  Third trimester   Rhogam     LAB RESULTS   Feeding Plan Bottle Blood Type B/Positive/-- (05/01 1549)   Contraception Undecided/Patch 03-17-18 Antibody Negative (05/01 1549)  Circumcision Yes  Rubella 3.37 (05/01 1549)  Pediatrician  undecided RPR Non Reactive (08/22 0946)   Support Person fob HBsAg Negative (05/01 1549)   Prenatal Classes  No HIV Non Reactive (08/22 0946)  BTL Consent  GBS  (For PCN allergy, check sensitivities)   VBAC Consent  Pap     Hgb Electro      CF     SMA     Waterbirth  [ ]  Class [ ]  Consent [ ]  CNM visit    OB History    Gravida  1   Para  0   Term  0   Preterm  0   AB  0   Living  0     SAB  0   TAB  0   Ectopic  0   Multiple  0   Live Births  0          Past Medical History:  Diagnosis Date  . Allergic state 06/28/2011  . Anemia 11/14/2011  . Anxiety   . Anxiety and depression 07/17/2011  . Back pain   . Cervical cancer screening 05/26/2012  . Dental infection 02/04/2016  . Depression    Pt. States "disorder is being called bipolar"  . Dysmenorrhea 05/26/2012  . Hypokalemia 11/14/2011  . Insomnia 02/21/2013  . Migraine 06/28/2011  . Neck strain 06/28/2012  . Overweight(278.02) 04/27/2012  . Preventative health care 06/28/2011  . Tachycardia 11/14/2011  . UTI (lower urinary tract infection) 08/18/2011   Past Surgical History:  Procedure Laterality Date  . WISDOM TOOTH EXTRACTION  27 yrs old   Family History: family history includes Arthritis in her maternal grandmother and mother; Cancer in her paternal grandfather;  Diabetes in her maternal grandfather; Hearing loss in her maternal grandmother and mother; Heart disease in her maternal grandmother; Hypertension in her maternal grandmother; Leukemia in her paternal grandfather; Menstrual problems in her maternal grandmother and mother; Migraines in her father, mother, and paternal grandfather; Obesity in her maternal grandfather and maternal grandmother; Ovarian cysts in her paternal aunt. Social History:  reports that she has never smoked. She has never used smokeless tobacco. She reports that she has current or past drug history. Drug: Marijuana. Frequency: 2.00 times per week. She reports that she does not drink alcohol.     Maternal Diabetes: No Genetic Screening: Normal Maternal Ultrasounds/Referrals: Normal Fetal Ultrasounds or other Referrals:  None Maternal Substance Abuse:  No Significant Maternal Medications:  None Significant Maternal Lab Results:  Lab values include: Group B Strep negative Other Comments:  None  Review of Systems  Constitutional: Negative for chills, fever and malaise/fatigue.  Eyes: Negative for blurred vision.  Respiratory: Negative for cough and shortness of breath.   Cardiovascular: Negative for chest pain.  Gastrointestinal: Positive for abdominal pain. Negative for heartburn and vomiting.  Genitourinary: Negative for dysuria, frequency and urgency.  Musculoskeletal: Negative.   Neurological: Negative for dizziness and headaches.  Psychiatric/Behavioral: Negative for depression.   Maternal Medical History:  Reason for admission: Rupture of membranes and contractions.   Contractions: Onset was 1-2 hours ago.    Fetal activity: Perceived fetal activity is normal.   Last perceived fetal movement was within the past hour.    Prenatal complications: no prenatal complications Prenatal Complications - Diabetes: none.    Dilation: 2.5 Effacement (%): 60 Station: -2 Exam by:: Paulette Fox RN Blood pressure 121/83,  pulse (!) 103, temperature 99 F (37.2 C), temperature source Oral, resp. rate 17, height 5\' 4"  (1.626 m), weight 71.7 kg, last menstrual period 07/20/2017, unknown if currently breastfeeding. Maternal Exam:  Uterine Assessment: Contraction strength is mild.  Contraction frequency is irregular.   Abdomen: Fetal presentation: vertex  Cervix: Cervix evaluated by digital exam.     Fetal Exam Fetal Monitor Review: Mode: ultrasound.   Variability: moderate (6-25 bpm).   Pattern: accelerations present and no decelerations.    Fetal State Assessment: Category I - tracings are normal.     Physical Exam  Nursing note and vitals reviewed. Constitutional: She is oriented to person, place, and time. She appears well-developed and well-nourished.  Neck: Normal range of motion.  Cardiovascular: Normal rate and regular rhythm.  Respiratory: Effort normal and breath sounds normal.  GI: Soft.  Musculoskeletal: Normal range of motion.  Neurological: She is alert and oriented to person, place, and time.  Skin: Skin is warm and dry.  Psychiatric: She has a normal mood and affect. Her behavior is normal. Judgment and thought content normal.    Prenatal labs: ABO, Rh: B/Positive/-- (05/01 1549) Antibody: Negative (05/01 1549) Rubella: 3.37 (05/01 1549) RPR: Non Reactive (08/22 0946)  HBsAg: Negative (05/01 1549)  HIV: Non Reactive (08/22 0946)  GBS: Negative (10/15 1142)   Assessment/Plan: G1P0000@[redacted]w[redacted]d  PROM with onset of early labor GBS negative  Admit to [facility] Expectant management on admission, consider augmentation PRN Anticipate NSVD   Fatima Blank 04/03/2018, 10:04 PM

## 2018-04-03 NOTE — Anesthesia Pain Management Evaluation Note (Signed)
  [facility] Pain Management Visit Note  Patient: Kathryn Randall, 28 y.o., female  "Hello I am a member of the anesthesia team at [facility]. We have an anesthesia team available at all times to provide care throughout the hospital, including epidural management and anesthesia for C-section. I don't know your plan for the delivery whether it a natural birth, water birth, IV sedation, nitrous supplementation, doula or epidural, but we want to meet your pain goals."   1.Was your pain managed to your expectations on prior hospitalizations?   No prior hospitalizations  2.What is your expectation for pain management during this hospitalization?     Epidural  3.How can we help you reach that goal? epidural  Record the patient's initial score and the patient's pain goal.   Pain: 5  Pain Goal: 7 The [facility] wants you to be able to say your pain was always managed very well.  Taj Nevins 04/03/2018

## 2018-04-04 ENCOUNTER — Inpatient Hospital Stay (HOSPITAL_COMMUNITY): Payer: Medicaid Other | Admitting: Anesthesiology

## 2018-04-04 ENCOUNTER — Encounter (HOSPITAL_COMMUNITY): Payer: Self-pay

## 2018-04-04 DIAGNOSIS — Z3A37 37 weeks gestation of pregnancy: Secondary | ICD-10-CM

## 2018-04-04 DIAGNOSIS — O4202 Full-term premature rupture of membranes, onset of labor within 24 hours of rupture: Secondary | ICD-10-CM

## 2018-04-04 LAB — TYPE AND SCREEN
ABO/RH(D): B POS
Antibody Screen: NEGATIVE

## 2018-04-04 MED ORDER — PHENYLEPHRINE 40 MCG/ML (10ML) SYRINGE FOR IV PUSH (FOR BLOOD PRESSURE SUPPORT)
PREFILLED_SYRINGE | INTRAVENOUS | Status: AC
  Filled 2018-04-04: qty 10

## 2018-04-04 MED ORDER — PRENATAL MULTIVITAMIN CH
1.0000 | ORAL_TABLET | Freq: Every day | ORAL | Status: DC
  Administered 2018-04-05 – 2018-04-06 (×2): 1 via ORAL
  Filled 2018-04-04 (×2): qty 1

## 2018-04-04 MED ORDER — TERBUTALINE SULFATE 1 MG/ML IJ SOLN
0.2500 mg | Freq: Once | INTRAMUSCULAR | Status: DC | PRN
  Filled 2018-04-04: qty 1

## 2018-04-04 MED ORDER — ONDANSETRON HCL 4 MG/2ML IJ SOLN: 4.0000 mg | INTRAMUSCULAR | Status: DC | PRN

## 2018-04-04 MED ORDER — EPHEDRINE 5 MG/ML INJ
10.0000 mg | INTRAVENOUS | Status: DC | PRN
  Filled 2018-04-04: qty 2

## 2018-04-04 MED ORDER — DIPHENHYDRAMINE HCL 50 MG/ML IJ SOLN: 12.5000 mg | INTRAMUSCULAR | Status: DC | PRN

## 2018-04-04 MED ORDER — SIMETHICONE 80 MG PO CHEW: 80.0000 mg | CHEWABLE_TABLET | ORAL | Status: DC | PRN

## 2018-04-04 MED ORDER — WITCH HAZEL-GLYCERIN EX PADS: 1.0000 "application " | MEDICATED_PAD | CUTANEOUS | Status: DC | PRN

## 2018-04-04 MED ORDER — LACTATED RINGERS IV SOLN
500.0000 mL | Freq: Once | INTRAVENOUS | Status: AC
  Administered 2018-04-04: 500 mL via INTRAVENOUS

## 2018-04-04 MED ORDER — PHENYLEPHRINE 40 MCG/ML (10ML) SYRINGE FOR IV PUSH (FOR BLOOD PRESSURE SUPPORT)
80.0000 ug | PREFILLED_SYRINGE | INTRAVENOUS | Status: DC | PRN
  Filled 2018-04-04: qty 5

## 2018-04-04 MED ORDER — FENTANYL 2.5 MCG/ML BUPIVACAINE 1/10 % EPIDURAL INFUSION (WH - ANES)
14.0000 mL/h | INTRAMUSCULAR | Status: DC | PRN
  Administered 2018-04-04 (×3): 14 mL/h via EPIDURAL
  Filled 2018-04-04 (×3): qty 100

## 2018-04-04 MED ORDER — ZOLPIDEM TARTRATE 5 MG PO TABS: 5.0000 mg | ORAL_TABLET | Freq: Every evening | ORAL | Status: DC | PRN

## 2018-04-04 MED ORDER — OXYTOCIN 40 UNITS IN LACTATED RINGERS INFUSION - SIMPLE MED
1.0000 m[IU]/min | INTRAVENOUS | Status: DC
  Administered 2018-04-04: 2 m[IU]/min via INTRAVENOUS

## 2018-04-04 MED ORDER — TETANUS-DIPHTH-ACELL PERTUSSIS 5-2.5-18.5 LF-MCG/0.5 IM SUSP
0.5000 mL | Freq: Once | INTRAMUSCULAR | Status: AC
  Administered 2018-04-06: 0.5 mL via INTRAMUSCULAR

## 2018-04-04 MED ORDER — DIPHENHYDRAMINE HCL 25 MG PO CAPS: 25.0000 mg | ORAL_CAPSULE | Freq: Four times a day (QID) | ORAL | Status: DC | PRN

## 2018-04-04 MED ORDER — COCONUT OIL OIL: 1.0000 "application " | TOPICAL_OIL | Status: DC | PRN

## 2018-04-04 MED ORDER — ONDANSETRON HCL 4 MG PO TABS
4.0000 mg | ORAL_TABLET | ORAL | Status: DC | PRN
  Administered 2018-04-04 – 2018-04-05 (×2): 4 mg via ORAL
  Filled 2018-04-04 (×2): qty 1

## 2018-04-04 MED ORDER — IBUPROFEN 600 MG PO TABS
600.0000 mg | ORAL_TABLET | Freq: Four times a day (QID) | ORAL | Status: DC
  Administered 2018-04-04 – 2018-04-06 (×8): 600 mg via ORAL
  Filled 2018-04-04 (×8): qty 1

## 2018-04-04 MED ORDER — ACETAMINOPHEN 325 MG PO TABS
650.0000 mg | ORAL_TABLET | ORAL | Status: DC | PRN
  Administered 2018-04-05: 650 mg via ORAL
  Filled 2018-04-04: qty 2

## 2018-04-04 MED ORDER — DIBUCAINE 1 % RE OINT: 1.0000 "application " | TOPICAL_OINTMENT | RECTAL | Status: DC | PRN

## 2018-04-04 MED ORDER — SENNOSIDES-DOCUSATE SODIUM 8.6-50 MG PO TABS
2.0000 | ORAL_TABLET | ORAL | Status: DC
  Administered 2018-04-04 – 2018-04-05 (×2): 2 via ORAL
  Filled 2018-04-04 (×2): qty 2

## 2018-04-04 MED ORDER — FENTANYL 2.5 MCG/ML BUPIVACAINE 1/10 % EPIDURAL INFUSION (WH - ANES)
INTRAMUSCULAR | Status: AC
  Filled 2018-04-04: qty 100

## 2018-04-04 MED ORDER — BENZOCAINE-MENTHOL 20-0.5 % EX AERO
1.0000 "application " | INHALATION_SPRAY | CUTANEOUS | Status: DC | PRN
  Administered 2018-04-04: 1 via TOPICAL
  Filled 2018-04-04: qty 56

## 2018-04-04 MED ORDER — LIDOCAINE HCL (PF) 1 % IJ SOLN
INTRAMUSCULAR | Status: DC | PRN
  Administered 2018-04-04: 13 mL via EPIDURAL

## 2018-04-04 NOTE — Progress Notes (Signed)
LABOR PROGRESS NOTE  Kathryn Randall is a 27 y.o. G2P0000 at [redacted]w[redacted]d  admitted for PROM.   Subjective: Comfortable without complaints. Family at bedside.   Objective: BP 105/79   Pulse (!) 105   Temp 98.6 F (37 C)   Resp 15   Ht 5\' 4"  (1.626 m)   Wt 71.7 kg   LMP 07/20/2017   SpO2 100%   Breastfeeding? Unknown   BMI 27.12 kg/m  or  Vitals:   04/04/18 1101 04/04/18 1131 04/04/18 1201 04/04/18 1231  BP: 122/76 118/74 120/77 105/79  Pulse: 95 95 (!) 116 (!) 105  Resp:      Temp:      TempSrc:      SpO2:      Weight:      Height:        Dilation: Lip/rim Effacement (%): 100 Cervical Position: Middle Station: 0 Presentation: Vertex Exam by:: Lynnda Shields RN FHT: baseline rate 140, moderate varibility, + acel, early decel Toco: q3-5 min   Labs: Lab Results  Component Value Date   WBC 10.6 (H) 04/03/2018   HGB 10.3 (L) 04/03/2018   HCT 31.3 (L) 04/03/2018   MCV 83.2 04/03/2018   PLT 237 04/03/2018    Patient Active Problem List   Diagnosis Date Noted  . PROM (premature rupture of membranes) 04/03/2018  . Supervision of normal first pregnancy, antepartum 10/08/2017  . Dental infection 02/04/2016  . 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 Insomnia 02/21/2013  . Cerumen impaction 06/28/2012  . Neck strain 06/28/2012  . Cervical cancer screening 05/26/2012  . Overweight 04/27/2012  . Tachycardia 11/14/2011  . Anemia 11/14/2011  . Hypokalemia 11/14/2011  . Anxiety and depression 07/17/2011  . Migraine 06/28/2011  . Allergic state 06/28/2011  . Preventative health care 06/28/2011  . Back pain     Assessment / Plan: 27 y.o. G2P0000 at [redacted]w[redacted]d here for PROM.   Labor: Augmentation with pitocin due to lack of cervical change on subsequent exams. Currently at 38.  IUPC placed given minimal cervical change since last exam Fetal Wellbeing:  Cat I  Pain Control:  Epidural in place  Anticipated MOD:  NSVD   Mena Goes, MD 04/04/2018, 1:05 PM

## 2018-04-04 NOTE — Progress Notes (Signed)
LABOR PROGRESS NOTE  Kathryn Randall is a 27 y.o. G2P0000 at [redacted]w[redacted]d  admitted for PROM.   Subjective: Comfortable without complaints. Family at bedside.   Objective: BP 112/73   Pulse 95   Temp 98.6 F (37 C)   Resp 16   Ht 5\' 4"  (1.626 m)   Wt 71.7 kg   LMP 07/20/2017   SpO2 100%   Breastfeeding? Unknown   BMI 27.12 kg/m  or  Vitals:   04/04/18 0735 04/04/18 0801 04/04/18 0831 04/04/18 0901  BP: (!) 105/59 (!) 98/58 108/62 112/73  Pulse: 91 89 (!) 102 95  Resp: 16 15 18 16   Temp:    98.6 F (37 C)  TempSrc:      SpO2:      Weight:      Height:        Dilation: 8 Effacement (%): 80 Cervical Position: Middle Station: -1 Presentation: Vertex Exam by:: Lynnda Shields RN FHT: baseline rate 140, moderate varibility, + acel, early decel Toco: q3-5 min   Labs: Lab Results  Component Value Date   WBC 10.6 (H) 04/03/2018   HGB 10.3 (L) 04/03/2018   HCT 31.3 (L) 04/03/2018   MCV 83.2 04/03/2018   PLT 237 04/03/2018    Patient Active Problem List   Diagnosis Date Noted  . PROM (premature rupture of membranes) 04/03/2018  . Supervision of normal first pregnancy, antepartum 10/08/2017  . Dental infection 02/04/2016  . 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 Insomnia 02/21/2013  . Cerumen impaction 06/28/2012  . Neck strain 06/28/2012  . Cervical cancer screening 05/26/2012  . Overweight 04/27/2012  . Tachycardia 11/14/2011  . Anemia 11/14/2011  . Hypokalemia 11/14/2011  . Anxiety and depression 07/17/2011  . Migraine 06/28/2011  . Allergic state 06/28/2011  . Preventative health care 06/28/2011  . Back pain     Assessment / Plan: 27 y.o. G2P0000 at [redacted]w[redacted]d here for PROM.   Labor: Augmentation with pitocin due to lack of cervical change on subsequent exams. Currently at 6 mu/min.  Fetal Wellbeing:  Cat I  Pain Control:  Epidural in place  Anticipated MOD:  NSVD   Phill Myron, D.O. OB Fellow  04/04/2018, 9:19 AM

## 2018-04-04 NOTE — Progress Notes (Signed)
Kathryn Randall is a 27 y.o. G2P0000 at [redacted]w[redacted]d by ultrasound admitted for rupture of membranes  Subjective: Pt comfortable with epidural. Family in room for support.  Objective: BP 118/77 (BP Location: Right Arm)   Pulse 92   Temp 98.7 F (37.1 C) (Oral)   Resp 17   Ht 5\' 4"  (1.626 m)   Wt 71.7 kg   LMP 07/20/2017   SpO2 100%   Breastfeeding? Unknown   BMI 27.12 kg/m  No intake/output data recorded. No intake/output data recorded.  FHT:  FHR: 135 bpm, variability: moderate,  accelerations:  Present,  decelerations:  Absent UC:   regular, every 2-3 minutes SVE:   Dilation: 8 Effacement (%): 100 Station: -1 Exam by:: [employer]  Labs: Lab Results  Component Value Date   WBC 10.6 (H) 04/03/2018   HGB 10.3 (L) 04/03/2018   HCT 31.3 (L) 04/03/2018   MCV 83.2 04/03/2018   PLT 237 04/03/2018    Assessment / Plan: Spontaneous labor, progressing normally  Labor: Progressing normally Preeclampsia:  n/a Fetal Wellbeing:  Category I Pain Control:  Epidural I/D:  GBS neg Anticipated MOD:  NSVD  Areyana Leoni Leftwich-Kirby 04/04/2018, 4:06 AM

## 2018-04-04 NOTE — Anesthesia Procedure Notes (Signed)
Epidural Patient location during procedure: OB Start time: 04/04/2018 12:55 AM End time: 04/04/2018 1:09 AM  Staffing Anesthesiologist: Lynda Rainwater, MD Performed: anesthesiologist   Preanesthetic Checklist Completed: patient identified, site marked, surgical consent, pre-op evaluation, timeout performed, IV checked, risks and benefits discussed and monitors and equipment checked  Epidural Patient position: sitting Prep: ChloraPrep Patient monitoring: heart rate, cardiac monitor, continuous pulse ox and blood pressure Approach: midline Location: L2-L3 Injection technique: LOR saline  Needle:  Needle type: Tuohy  Needle gauge: 17 G Needle length: 9 cm Needle insertion depth: 5 cm Catheter type: closed end flexible Catheter size: 20 Guage Catheter at skin depth: 9 cm Test dose: negative  Assessment Events: blood not aspirated, injection not painful, no injection resistance, negative IV test and no paresthesia  Additional Notes Reason for block:procedure for pain

## 2018-04-04 NOTE — Anesthesia Preprocedure Evaluation (Signed)
Anesthesia Evaluation  Patient identified by MRN, date of birth, ID band Patient awake    Reviewed: Allergy & Precautions, NPO status , Patient's Chart, lab work & pertinent test results  Airway Mallampati: II  TM Distance: >3 FB Neck ROM: Full    Dental no notable dental hx.    Pulmonary neg pulmonary ROS,    Pulmonary exam normal breath sounds clear to auscultation       Cardiovascular negative cardio ROS Normal cardiovascular exam Rhythm:Regular Rate:Normal     Neuro/Psych negative neurological ROS  negative psych ROS   GI/Hepatic negative GI ROS, Neg liver ROS,   Endo/Other  negative endocrine ROS  Renal/GU negative Renal ROS  negative genitourinary   Musculoskeletal negative musculoskeletal ROS (+)   Abdominal   Peds negative pediatric ROS (+)  Hematology negative hematology ROS (+)   Anesthesia Other Findings   Reproductive/Obstetrics (+) Pregnancy                             Anesthesia Physical Anesthesia Plan  ASA: II  Anesthesia Plan: Epidural   Post-op Pain Management:    Induction:   PONV Risk Score and Plan:   Airway Management Planned:   Additional Equipment:   Intra-op Plan:   Post-operative Plan:   Informed Consent:   Plan Discussed with:   Anesthesia Plan Comments:         Anesthesia Quick Evaluation  

## 2018-04-05 LAB — CBC
HCT: 26.7 % — ABNORMAL LOW (ref 36.0–46.0)
Hemoglobin: 8.9 g/dL — ABNORMAL LOW (ref 12.0–15.0)
MCH: 27.6 pg (ref 26.0–34.0)
MCHC: 33.3 g/dL (ref 30.0–36.0)
MCV: 82.9 fL (ref 80.0–100.0)
Platelets: 174 K/uL (ref 150–400)
RBC: 3.22 MIL/uL — ABNORMAL LOW (ref 3.87–5.11)
RDW: 14.9 % (ref 11.5–15.5)
WBC: 10.8 K/uL — ABNORMAL HIGH (ref 4.0–10.5)
nRBC: 0 % (ref 0.0–0.2)

## 2018-04-05 LAB — RPR: RPR Ser Ql: NONREACTIVE

## 2018-04-05 NOTE — Lactation Note (Signed)
This note was copied from a baby's chart. Lactation Consultation Note  Patient Name: Kathryn Randall Today's Date: 04/05/2018 Reason for consult: Initial assessment;Primapara;1st time breastfeeding;Early term 37-38.6wks  P1 mother whose infant is now 83 hours old.  Mother's feeding choice on admission was formula, however, baby did not want to take the formula, according to mother.    When I arrived mother had baby clothed and wrapped in a fleece blanket.  He was at the breast but was not feeding.  I suggested unwrapping, changing diaper and trying to relatch.  Mother agreed.    Assisted baby to latch in the cross cradle hold on the right breast without difficulty.  Mother's breasts are soft and non tender and nipples are everted.  Rhythmic sucking noted and gentle stimulation used to keep him actively sucking at the breast.  Mother pleased with his feeding.  I asked her commitment to breast feeding and she stated, "If he wants to breast feed then I am okay with it."  She stated that, during her pregnancy, she did not think about breast feeding because she knew many people who were not successful with it and their babies would not latch.  I commented on how well she and baby worked together and her baby latched very well.  At this time, she is planning on continuing breast feeding.  Encouraged to feed 8-12 times/24 hours or sooner if baby shows cues.  Reviewed feeding cues.  Hand expression taught and colostrum container provided for any EBM she may obtain with hand expression.  She will call for latch assistance as needed.    Mom made aware of O/P services, breastfeeding support groups, community resources, and our phone # for post-discharge questions. Father present.   Maternal Data Has patient been taught Hand Expression?: Yes Does the patient have breastfeeding experience prior to this delivery?: No  Feeding Feeding Type: Breast Fed  LATCH Score Latch: Grasps breast easily, tongue  down, lips flanged, rhythmical sucking.  Audible Swallowing: A few with stimulation  Type of Nipple: Everted at rest and after stimulation  Comfort (Breast/Nipple): Soft / non-tender  Hold (Positioning): Assistance needed to correctly position infant at breast and maintain latch.  LATCH Score: 8  Interventions Interventions: Breast feeding basics reviewed;Assisted with latch;Skin to skin;Breast massage;Hand express;Position options;Support pillows;Adjust position;Breast compression  Lactation Tools Discussed/Used     Consult Status Consult Status: Follow-up Date: 04/06/18 Follow-up type: In-patient    Mackie Goon R Jazae Gandolfi 04/05/2018, 1:13 PM

## 2018-04-05 NOTE — Anesthesia Postprocedure Evaluation (Signed)
Anesthesia Post Note  Patient: Kathryn Randall  Procedure(s) Performed: AN AD HOC LABOR EPIDURAL     Patient location during evaluation: Mother Baby Anesthesia Type: Epidural Level of consciousness: awake and alert Pain management: pain level controlled Vital Signs Assessment: post-procedure vital signs reviewed and stable Respiratory status: spontaneous breathing Cardiovascular status: stable Postop Assessment: no headache, patient able to bend at knees, no apparent nausea or vomiting, no backache, epidural receding, adequate PO intake and able to ambulate Anesthetic complications: no    Last Vitals:  Vitals:   04/05/18 0109 04/05/18 0605  BP: 127/87 117/84  Pulse: 79 81  Resp: 16 16  Temp: 36.8 C 36.6 C  SpO2: 100% 97%    Last Pain:  Vitals:   04/05/18 0605  TempSrc: Oral  PainSc:    Pain Goal: Patients Stated Pain Goal: 0 (04/04/18 2313)               [name]

## 2018-04-05 NOTE — Plan of Care (Signed)
  Problem: Education: Goal: Knowledge of condition will improve Outcome: Progressing   Problem: Activity: Goal: Will verbalize the importance of balancing activity with adequate rest periods Outcome: Progressing Goal: Ability to tolerate increased activity will improve Outcome: Progressing   Problem: Coping: Goal: Ability to identify and utilize available resources and services will improve Outcome: Progressing   Problem: Life Cycle: Goal: Chance of risk for complications during the postpartum period will decrease Outcome: Progressing   Problem: Role Relationship: Goal: Ability to demonstrate positive interaction with newborn will improve Outcome: Progressing

## 2018-04-05 NOTE — Progress Notes (Signed)
CSW acknowledges consult.  CSW attempted to meet with MOB, however MOB had several room guest.  CSW will attempt to visit with MOB at a later time.   Ronneisha Jett Boyd-Gilyard, MSW, LCSW Clinical Social Work [phone]  

## 2018-04-05 NOTE — Plan of Care (Signed)
  Problem: Life Cycle: Goal: Ability to make normal progression through stages of labor will improve Outcome: Progressing   Problem: Education: Goal: Knowledge of condition will improve Outcome: Progressing   Problem: Activity: Goal: Will verbalize the importance of balancing activity with adequate rest periods Outcome: Progressing Goal: Ability to tolerate increased activity will improve Outcome: Progressing   Problem: Coping: Goal: Ability to identify and utilize available resources and services will improve Outcome: Progressing   Problem: Life Cycle: Goal: Chance of risk for complications during the postpartum period will decrease Outcome: Progressing   Problem: Role Relationship: Goal: Ability to demonstrate positive interaction with newborn will improve Outcome: Progressing

## 2018-04-05 NOTE — Progress Notes (Signed)
Post Partum Day 1 Subjective: no complaints, up ad lib, voiding, tolerating PO and + flatus  Objective: Blood pressure 117/84, pulse 81, temperature 97.9 F (36.6 C), temperature source Oral, resp. rate 16, height 5\' 4"  (1.626 m), weight 71.7 kg, last menstrual period 07/20/2017, SpO2 97 %, unknown if currently breastfeeding.  Physical Exam:  General: alert, cooperative and no distress Lochia: appropriate Uterine Fundus: firm Incision: NA DVT Evaluation: No evidence of DVT seen on physical exam.  Recent Labs    04/03/18 2043 04/05/18 0635  HGB 10.3* 8.9*  HCT 31.3* 26.7*    Assessment/Plan: Plan for discharge tomorrow   LOS: 2 days   Kathryn Randall 04/05/2018, 10:27 AM

## 2018-04-06 MED ORDER — DIBUCAINE 1 % RE OINT: 1.0000 "application " | TOPICAL_OINTMENT | RECTAL | 0 refills | Status: AC | PRN

## 2018-04-06 MED ORDER — INFLUENZA VAC SPLIT QUAD 0.5 ML IM SUSY
0.5000 mL | PREFILLED_SYRINGE | INTRAMUSCULAR | Status: AC
  Administered 2018-04-06: 0.5 mL via INTRAMUSCULAR

## 2018-04-06 MED ORDER — TETANUS-DIPHTH-ACELL PERTUSSIS 5-2.5-18.5 LF-MCG/0.5 IM SUSP: 0.5000 mL | Freq: Once | INTRAMUSCULAR | 0 refills | Status: AC

## 2018-04-06 MED ORDER — WITCH HAZEL-GLYCERIN EX PADS: 1.0000 "application " | MEDICATED_PAD | CUTANEOUS | 12 refills | Status: AC | PRN

## 2018-04-06 MED ORDER — ACETAMINOPHEN 325 MG PO TABS: 650.0000 mg | ORAL_TABLET | ORAL | 2 refills | Status: AC | PRN

## 2018-04-06 NOTE — Lactation Note (Addendum)
This note was copied from a baby's chart. Lactation Consultation Note  Patient Name: Kathryn Randall HUTML'Y Date: 04/06/2018 Reason for consult: Follow-up assessment;1st time breastfeeding;Primapara;Infant < 6lbs;Nipple pain/trauma;Early term 29-38.6wks  Visited with P1 Mom of ET infant at 75 hrs old.  Baby at 7% weight loss.  Mom initially was planning to formula feed, but switched to breast.  Baby has been given many bottles and appears to be wanting that faster flow rate.  Mom trying to settle baby down to feed.  Using cradle hold, baby popping on and off the breast, crying and arching.  Assisted with Use of cross cradle hold, to be better control a deeper latch.  Unable to express any colostrum.  Baby still acting fussy and Mom starting to tear up.  Initiated a SNS at the breast with instructions on use and cleaning of parts.  Baby settled in and fed with a deep latch in cross cradle hold.    Set up DEBP and assisted Mom with first pumping.  Mom does not have a DEBP.  Does have [place] [facility].  Offered a pump loaner for $30 deposit.    Plan- 1- Breastfeed baby STS at least every 3 hrs.   2- Use 10-20 ml EBM+/formula in SNS at each feeding, asking for help prn 3- pump both breasts on initiation setting for 15 mins.   4- recommended OP follow up with LC.  Recommend Mom and FOB latch baby independently prior to discharge.  Mom seems a bit overwhelmed this am.  Feeding Feeding Type: Formula  LATCH Score Latch: Grasps breast easily, tongue down, lips flanged, rhythmical sucking.  Audible Swallowing: Spontaneous and intermittent(with formula supplement at the breast)  Type of Nipple: Everted at rest and after stimulation  Comfort (Breast/Nipple): Soft / non-tender  Hold (Positioning): Assistance needed to correctly position infant at breast and maintain latch.  LATCH Score: 9  Interventions Interventions: Breast feeding basics reviewed;Assisted with latch;Skin to skin;Breast  massage;Hand express;Breast compression;Adjust position;Support pillows;Position options;DEBP  Lactation Tools Discussed/Used WIC Program: Yes Pump Review: Setup, frequency, and cleaning;Milk Storage Initiated by:: Cipriano Mile RN IBCLC Date initiated:: 04/06/18   Consult Status Consult Status: Follow-up Date: 04/06/18 Follow-up type: In-patient    Broadus John 04/06/2018, 9:37 AM

## 2018-04-06 NOTE — Clinical Social Work Maternal (Signed)
CLINICAL SOCIAL WORK MATERNAL/CHILD NOTE  Patient Details  Name: Kathryn Randall MRN: [id number] Date of Birth: 06/10/1991  Date:  11/27/2017  Clinical Social Worker Initiating Note:  Laurey Arrow Date/Time: Initiated:  04/06/18/1108     Child's Name:  Vernie Shanks   Biological Parents:  Mother, Father   Need for Interpreter:  None   Reason for Referral:  Behavioral Health Concerns, Current Substance Use/Substance Use During Pregnancy (hx of bipolar disorder and marijuana use. )   Address:  [address] [place] [address]    Phone number:  [phone] (home)     Additional phone number:   Household Members/Support Persons (HM/SP):   Household Member/Support Person 1   HM/SP Name Relationship DOB or Age  HM/SP -1 Victorious Kundinger FOB 04/19/1988  HM/SP -2        HM/SP -3        HM/SP -4        HM/SP -5        HM/SP -6        HM/SP -7        HM/SP -8          Natural Supports (not living in the home):  Parent, Extended Family, Immediate Family   Professional Supports: None   Employment: Part-time   Type of Work: MOB works for [employer].    Education:  High school graduate   Homebound arranged:    Financial Resources:  Medicaid   Other Resources:  (MOB was given to apply for [employer]. )   Cultural/Religious Considerations Which May Impact Care:  Per MOB's Face Sheet, MOB is [place].  Strengths:  Ability to meet basic needs , Compliance with medical plan , Home prepared for child , Pediatrician chosen, Psychotropic Medications, Understanding of illness   Psychotropic Medications:  Effexor      Pediatrician:    [place]  Pediatrician List:   [facility]      Pediatrician Fax Number:    Risk Factors/Current Problems:  Mental Health Concerns , Substance Use    Cognitive  State:  Able to Concentrate , Alert , Linear Thinking , Insightful    Mood/Affect:  Calm , Comfortable , Happy , Relaxed , Interested    CSW Assessment: CSW met with MOB in room 110 to complete an assessment for MH hx and SA hx.  When CSW arrived, MOB was bonding with infant as evidence by engaging in skin to skin.  MOB appeared comfortable and relaxed caring for infant. FOB was also present and with MOB's permission CSW asked FOB to leave the room in order to meet with MOB in private. MOB was polite, forthcoming, and easy to engage.   CSW asked about MOB's MH hx and MOB openly shared a hx of depression and signs and symptoms of bipolar but no formal dx.  MOB communicated being compliant with her medication which is being managed by MOB's PCP.  CSW offered outpatient resources and MOB declined.  CSW provided education regarding the baby blues period vs. perinatal mood disorders, discussed treatment and gave resources for mental health follow up if concerns arise.  CSW recommends self-evaluation during the postpartum time period using the New Mom Checklist from Postpartum Progress and encouraged MOB to contact a medical professional if symptoms are noted at any time.  CSW assessed for safety and MOB denied SI, HI, and DV.  MOB presented with insight an awareness and did not demonstrate any acute MH signs or symptoms.   CSW asked about MOB's SA hx and MOB openly acknowledged the use of marijuana throughout her pregnancy.  MOB reported using marijuana to assist with decreasing MOB's nausea.  CSW made MOB aware of hospital's policy regarding substance exposure of infant. CSW informed MOB that infant's UDS was positive for THC and CSW will make a report to [place] CPS.  MOB denied CPS hx.  MOB is aware that CSW will also provide CPS with infant's CDS results.   CSW left a message for [place] CPS and requested return call.   CSW provided review of Sudden Infant Death Syndrome (SIDS)  precautions.    CSW identifies no further need for intervention and no barriers to discharge at this time.  CSW Plan/Description:  No Further Intervention Required/No Barriers to Discharge, Sudden Infant Death Syndrome (SIDS) Education, Perinatal Mood and Anxiety Disorder (PMADs) Education, Hospital Drug Screen Policy Information, CSW Will Continue to Monitor Umbilical Cord Tissue Drug Screen Results and Make Report if Warranted   Zak Gondek Boyd-Gilyard, MSW, LCSW Clinical Social Work [phone]  Raniyah Curenton D BOYD-GILYARD, LCSW 04/06/2018, 11:12 AM 

## 2018-04-06 NOTE — Discharge Instructions (Signed)

## 2018-04-06 NOTE — Progress Notes (Signed)
CSW made [facility] CPS report for infant's positive UDS for THC. Report was made to [facility].   Laurey Arrow, MSW, LCSW Clinical Social Work [phone]

## 2018-04-06 NOTE — Discharge Summary (Signed)
Postpartum Discharge Summary     Patient Name: Kathryn Randall DOB: 11/03/1990 MRN: [id number]  Date of admission: 04/03/2018 Delivering Provider: Mena Goes E   Date of discharge: 04/06/2018  Admitting diagnosis: 64 WKS WATER BROKE AND HAVING SOME CTX Intrauterine pregnancy: [redacted]w[redacted]d     Secondary diagnosis:  Principal Problem:   SVD (spontaneous vaginal delivery) Active Problems:   Supervision of normal first pregnancy, antepartum  Additional problems: None     Discharge diagnosis: Term Pregnancy Delivered                                                                                                Post partum procedures:N/A  Augmentation: Pitocin  Complications: None  Hospital course:  Onset of Labor With Vaginal Delivery     27 y.o. yo G2P1001 at [redacted]w[redacted]d was admitted in Latent Labor on 04/03/2018. Patient had an uncomplicated labor course as follows:  Membrane Rupture Time/Date: 6:00 PM ,04/03/2018   Intrapartum Procedures: Episiotomy: None [1]                                         Lacerations:  2nd degree [3];Perineal [11];Periurethral [8]  Patient had a delivery of a Viable infant. 04/04/2018  Information for the patient's newborn:  Shaolin, Armas [[id number]]       [name] had an uncomplicated postpartum course.  She is ambulating, tolerating a regular diet, passing flatus, and urinating well. Patient is discharged home in stable condition on 04/06/18.   Magnesium Sulfate recieved: No BMZ received: No  Physical exam  Vitals:   04/05/18 0605 04/05/18 1401 04/05/18 2006 04/06/18 0524  BP: 117/84 109/76 107/82 113/83  Pulse: 81 70 96 79  Resp: 16 18 16 16   Temp: 97.9 F (36.6 C) 98.1 F (36.7 C) 98 F (36.7 C) 98.1 F (36.7 C)  TempSrc: Oral Oral Oral Oral  SpO2: 97%  100%   Weight:      Height:       General: alert, cooperative and no distress Lochia: appropriate Uterine Fundus: firm Incision: N/A DVT Evaluation: No evidence of DVT seen  on physical exam. Labs: Lab Results  Component Value Date   WBC 10.8 (H) 04/05/2018   HGB 8.9 (L) 04/05/2018   HCT 26.7 (L) 04/05/2018   MCV 82.9 04/05/2018   PLT 174 04/05/2018   CMP Latest Ref Rng & Units 09/01/2017  Glucose 65 - 99 mg/dL 87  BUN 6 - 20 mg/dL 13  Creatinine 0.44 - 1.00 mg/dL 0.54  Sodium 135 - 145 mmol/L 137  Potassium 3.5 - 5.1 mmol/L 4.2  Chloride 101 - 111 mmol/L 103  CO2 22 - 32 mmol/L 21(L)  Calcium 8.9 - 10.3 mg/dL 9.3  Total Protein 6.5 - 8.1 g/dL 7.7  Total Bilirubin 0.3 - 1.2 mg/dL 0.4  Alkaline Phos 38 - 126 U/L 52  AST 15 - 41 U/L 19  ALT 14 - 54 U/L 12(L)    Discharge instruction: per After Visit Summary and "Baby  and Me Booklet".  After visit meds:  Allergies as of 04/06/2018      Reactions   Ibuprofen    Racing heart- when used in large doses   Morphine And Related Hives, Itching      Medication List    STOP taking these medications   COMFORT FIT MATERNITY SUPP SM Misc     TAKE these medications   acetaminophen 500 MG tablet Commonly known as:  TYLENOL Take 500 mg by mouth every 4 (four) hours as needed for mild pain or headache. What changed:  Another medication with the same name was added. Make sure you understand how and when to take each.   acetaminophen 325 MG tablet Commonly known as:  TYLENOL Take 2 tablets (650 mg total) by mouth every 4 (four) hours as needed. What changed:  You were already taking a medication with the same name, and this prescription was added. Make sure you understand how and when to take each.   dibucaine 1 % Oint Commonly known as:  NUPERCAINAL Place 1 application rectally as needed for hemorrhoids.   omeprazole 20 MG capsule Commonly known as:  PRILOSEC Take 1 capsule (20 mg total) by mouth 2 (two) times daily before a meal.   Tdap 5-2.5-18.5 LF-MCG/0.5 injection Commonly known as:  BOOSTRIX Inject 0.5 mLs into the muscle once for 1 dose.   venlafaxine XR 75 MG 24 hr capsule Commonly  known as:  EFFEXOR-XR TAKE 3 CAPSULES BY MOUTH DAILY WITH BREAKFAST What changed:    how much to take  how to take this  when to take this   witch hazel-glycerin pad Commonly known as:  TUCKS Apply 1 application topically as needed for hemorrhoids.       Diet: routine diet  Activity: Advance as tolerated. Pelvic rest for 6 weeks.   Outpatient follow up:4 weeks Follow up Appt: Future Appointments  Date Time Provider [place]  04/07/2018 11:00 AM Shelly Bombard, MD [facility] None  04/15/2018  1:45 PM Constant, Vickii Chafe, MD [place] None   Follow up Visit: [place] Follow up in 4 week(s).   Why:  Message has been sent to [facility] clinic to call you to schedule 4 week postpartum appointment Contact information: [address] [place] [address] [phone]           Please schedule this patient for Postpartum visit in: 4 weeks with the following provider: Any provider For C/S patients schedule nurse incision check in weeks 2 weeks: no Low risk pregnancy complicated by: no complications Delivery mode:  SVD Anticipated Birth Control:  OCPs PP Procedures needed: None  Schedule Integrated BH visit: no      Newborn Data: Live born female  Birth Weight: 5 lb 15.4 oz (2705 g) APGAR: 9, 9  Newborn Delivery   Birth date/time:  04/04/2018 15:07:00 Delivery type:  Vaginal, Spontaneous     Baby Feeding: Bottle Disposition:home with mother   Darlina Rumpf, [facility] 04/06/18  6:24 AM

## 2018-04-06 NOTE — Progress Notes (Signed)
CSW received and acknowledges consult for EDPS of 9.  Consult screened out due to 9 on EDPS does not warrant a CSW consult.  MOB whom scores are greater than 9/yes to question 10 on Edinburgh Postpartum Depression Screen warrants a CSW consult.   Vilma Will Boyd-Gilyard, MSW, LCSW Clinical Social Work [phone]  

## 2018-04-07 ENCOUNTER — Encounter: Payer: Medicaid Other | Admitting: Obstetrics

## 2018-04-15 ENCOUNTER — Encounter: Payer: Medicaid Other | Admitting: Obstetrics and Gynecology

## 2018-04-20 ENCOUNTER — Ambulatory Visit (INDEPENDENT_AMBULATORY_CARE_PROVIDER_SITE_OTHER): Payer: Medicaid Other | Admitting: Obstetrics

## 2018-04-20 ENCOUNTER — Encounter: Payer: Self-pay | Admitting: Obstetrics

## 2018-04-20 ENCOUNTER — Encounter: Payer: Medicaid Other | Admitting: Obstetrics

## 2018-04-20 DIAGNOSIS — Z3009 Encounter for other general counseling and advice on contraception: Secondary | ICD-10-CM

## 2018-04-20 DIAGNOSIS — G44201 Tension-type headache, unspecified, intractable: Secondary | ICD-10-CM

## 2018-04-20 DIAGNOSIS — F419 Anxiety disorder, unspecified: Secondary | ICD-10-CM

## 2018-04-20 DIAGNOSIS — F329 Major depressive disorder, single episode, unspecified: Secondary | ICD-10-CM

## 2018-04-20 DIAGNOSIS — F32A Depression, unspecified: Secondary | ICD-10-CM

## 2018-04-20 MED ORDER — BUTALBITAL-APAP-CAFFEINE 50-325-40 MG PO TABS: 2.0000 | ORAL_TABLET | Freq: Four times a day (QID) | ORAL | 2 refills | Status: DC | PRN

## 2018-04-20 NOTE — Progress Notes (Addendum)
Subjective:     Kathryn Randall is a 27 y.o. female who presents for a postpartum visit. She is 2 weeks postpartum following a spontaneous vaginal delivery. I have fully reviewed the prenatal and intrapartum course. The delivery was at 56 gestational weeks. Outcome: spontaneous vaginal delivery. Anesthesia: epidural. Postpartum course has been normal. Baby's course has been normal. Baby is feeding by bottle [name]. Bleeding thin lochia. Bowel function is normal. Bladder function is normal. Patient is not sexually active. Contraception method is abstinence. Postpartum depression screening: positive.  Tobacco, alcohol and substance abuse history reviewed.  Adult immunizations reviewed including TDAP, rubella and varicella.  The following portions of the patient's history were reviewed and updated as appropriate: allergies, current medications, past family history, past medical history, past social history, past surgical history and problem list.  Review of Systems A comprehensive review of systems was negative except for: Behavioral/Psych: positive for depression   Objective:    BP 115/78   Pulse (!) 101   Ht 5\' 4"  (1.626 m)   Wt 139 lb 12.8 oz (63.4 kg)   LMP 07/20/2017   BMI 24.00 kg/m    PE:  Deferred    >50% of 15 min visit spent on counseling and coordination of care  Assessment:      1. Postpartum examination following vaginal delivery  2. Encounter for other general counseling and advice on contraception - wants IUD  3. Acute intractable tension-type headache Rx: - butalbital-acetaminophen-caffeine (FIORICET, ESGIC) 50-325-40 MG tablet; Take 2 tablets by mouth every 6 (six) hours as needed for headache.  Dispense: 30 tablet; Refill: 2  4. Anxiety and depression Rx: - venlafaxine XR (EFFEXOR-XR) 75 MG 24 hr capsule; TAKE 3 CAPSULES BY MOUTH DAILY WITH BREAKFAST  Dispense: 90 capsule; Refill: 6   Plan:    1. Contraception: IUD 2. Continue PNV's 3. Follow  up in: 6 weeks for IUD Insertion, or as needed.   Shelly Bombard MD 04-20-2018

## 2018-04-20 NOTE — Progress Notes (Signed)
Pt scored a 20 on the Edinburgh Depression Scale.

## 2018-04-21 ENCOUNTER — Other Ambulatory Visit: Payer: Self-pay

## 2018-04-21 DIAGNOSIS — F53 Postpartum depression: Secondary | ICD-10-CM

## 2018-04-21 DIAGNOSIS — O99345 Other mental disorders complicating the puerperium: Principal | ICD-10-CM

## 2018-04-21 MED ORDER — VENLAFAXINE HCL ER 75 MG PO CP24: ORAL_CAPSULE | ORAL | 6 refills | Status: DC

## 2018-04-21 NOTE — Progress Notes (Signed)
Pt seen yesterday for pp depression. She said she's been on Effexor for years but admits it is no longer working. She is requesting something in addition to this medication. Pt denies homicidal/suicidal thoughts.   Consulted with MD. Pt needs referral to [facility]. Pt declines referral. She counseling did not work in the past. She said her PCP manages her antidepressants so she will contact her.

## 2018-04-21 NOTE — Addendum Note (Signed)
Addended by: Baltazar Najjar A on: 04/21/2018 10:05 AM   Modules accepted: Orders

## 2018-04-29 ENCOUNTER — Encounter: Payer: Self-pay | Admitting: Family Medicine

## 2018-04-29 ENCOUNTER — Ambulatory Visit: Payer: Self-pay | Admitting: *Deleted

## 2018-04-29 ENCOUNTER — Ambulatory Visit (INDEPENDENT_AMBULATORY_CARE_PROVIDER_SITE_OTHER): Payer: Medicaid Other | Admitting: Family Medicine

## 2018-04-29 VITALS — BP 110/70 | HR 73 | Temp 98.2°F | Ht 64.0 in | Wt 141.5 lb

## 2018-04-29 DIAGNOSIS — B084 Enteroviral vesicular stomatitis with exanthem: Secondary | ICD-10-CM

## 2018-04-29 MED ORDER — METHYLPREDNISOLONE ACETATE 80 MG/ML IJ SUSP
80.0000 mg | Freq: Once | INTRAMUSCULAR | Status: AC
  Administered 2018-04-29: 80 mg via INTRAMUSCULAR

## 2018-04-29 MED ORDER — PREDNISONE 20 MG PO TABS: 40.0000 mg | ORAL_TABLET | Freq: Every day | ORAL | 0 refills | Status: AC

## 2018-04-29 NOTE — Progress Notes (Signed)
Chief Complaint  Patient presents with  . Rash    hands and feet    Kathryn Randall is a 27 y.o. female here for a skin complaint.  Duration: 2 days Location: hands, feet Pruritic? Yes Painful? Yes- burning Drainage? No New soaps/lotions/topicals/detergents? No Sick contacts? No- son does have lesions in mouth (dx unclear) Other associated symptoms: none, denies fevers Therapies tried thus far: Benadryl without relief  ROS:  Const: No fevers Skin: As noted in HPI  Past Medical History:  Diagnosis Date  . Allergic state 06/28/2011  . Anemia 11/14/2011  . Anxiety   . Anxiety and depression 07/17/2011  . Back pain   . Cervical cancer screening 05/26/2012  . Dental infection 02/04/2016  . Depression    Pt. States "disorder is being called bipolar"  . Dysmenorrhea 05/26/2012  . Hypokalemia 11/14/2011  . Insomnia 02/21/2013  . Migraine 06/28/2011  . Neck strain 06/28/2012  . Overweight(278.02) 04/27/2012  . Preventative health care 06/28/2011  . Tachycardia 11/14/2011    BP 110/70 (BP Location: Left Arm, Patient Position: Sitting, Cuff Size: Normal)   Pulse 73   Temp 98.2 F (36.8 C) (Oral)   Ht 5\' 4"  (1.626 m)   Wt 141 lb 8 oz (64.2 kg)   LMP 07/20/2017   SpO2 98%   BMI 24.29 kg/m  Gen: awake, alert, appearing stated age 55: There is a small ulceration over soft palate on L Lungs: No accessory muscle use Skin: Erythematous and circular macules/patches on soles of hands/feet. No drainage, +mild TTP, no fluctuance, excoriation Psych: Age appropriate judgment and insight  Hand, foot and mouth disease  Depo today. Supportive care. Discussed that this is self-resolving. Warning signs and symptoms verbalized and written down in AVS.  If Depo helpful, will fill pred.  F/u prn. The patient voiced understanding and agreement to the plan.  [place], DO 04/29/18 2:06 PM

## 2018-04-29 NOTE — Progress Notes (Signed)
Pre visit review using our clinic review tool, if applicable. No additional management support is needed unless otherwise documented below in the visit note. 

## 2018-04-29 NOTE — Telephone Encounter (Signed)
Patient has hives on palms of hands and souls of feet since last night. Severe itching. Denies SOB/tongue swelling/difficulty swallowing/abd pain. She has been taking benadryl orally that has not helped. She has a newborn baby recently diagnosed with possible thrush, she reports. Appointment made for this afternoon with Dr. Jimmy Footman. Reason for Disposition . [1] Hives have become worse AND [2] taking oral steroids (e.g., prednisone) > 24 hours  Answer Assessment - Initial Assessment Questions 1.) CALLER DIAGNOSIS: "What do you think is causing the rash?" (e.g., Chickenpox, Hives, Impetigo, Athlete's Foot, etc.)  2.) LOCATION:  "Is it widespread or localized?"  3.) NEW MEDICATIONS: "Are you taking any new medicine?" Rash on hands and feet only. Noticed last night on palm of both hands and bottom of both feet. Raised red dots/hives that itch a lot.  Protocols used: HIVES-A-AH, [place]

## 2018-04-29 NOTE — Addendum Note (Signed)
Addended by: Sharon Seller B on: 04/29/2018 02:12 PM   Modules accepted: Orders

## 2018-04-29 NOTE — Patient Instructions (Addendum)
If this shot does not help, do not fill medicine (prednisone).  OK to take Tylenol 1000 mg (2 extra strength tabs) or 975 mg (3 regular strength tabs) every 6 hours as needed.  Ibuprofen 400-600 mg (2-3 over the counter strength tabs) every 6 hours as needed for pain. Do not take this if you are taking prednisone.  Continue to push fluids, practice good hand hygiene, and cover your mouth if you cough.  If you start having fevers, shaking or shortness of breath, seek immediate care.  Hand, Foot, and Mouth Disease, Adult Hand, foot, and mouth disease is a common viral illness. It happens mainly in children who are younger than 14 years old, but adolescents and adults may also get it. The illness often causes:  Sore throat.  Sores in the mouth.  Fever.  Rash on the hands and feet.  Usually, this condition is not serious. Most people get better within 1-2 weeks. What are the causes? This condition is usually caused by a group of viruses called enteroviruses. The disease can spread from person to person (is contagious). A person is most contagious during the first week of the illness. The infection spreads through direct contact with:  Nose discharge of an infected person.  Throat discharge of an infected person.  Stool (feces) of an infected person.  What are the signs or symptoms? Symptoms of this condition include:  Small sores in the mouth. These may cause pain.  A rash on the hands and feet, and sometimes on the buttocks. The rash may also occur on the arms, legs, or other areas of the body. The rash may look like small red bumps or sores and may have blisters.  Fever.  Body aches or headaches.  Irritability.  Decreased appetite.  How is this diagnosed? This condition can usually be diagnosed with a physical exam in which your health care provider will look at your rash and mouth sores. Tests are usually not needed. In some cases, a stool (feces) sample or a throat swab  may be taken to check for the virus or for other infections. How is this treated? In most cases, no treatment is needed. People usually get better within 2 weeks without treatment. To help relieve pain or fever, your health care provider may recommend over-the-counter medicines such as ibuprofen or acetaminophen. To help relieve discomfort from mouth sores, your health care provider may recommend using:  Solutions that are rinsed in the mouth.  Pain-relieving gel that is applied to the sores (topical gel).  Antacid medicine.  Follow these instructions at home: Managing pain and discomfort  Rinse your mouth with a salt-water mixture 3-4 times a day or as needed. To make a salt-water mixture, completely dissolve -1 tsp of salt in 1 cup of warm water. This can help to reduce pain from the mouth sores. Your health care provider may also recommend other rinse solutions to treat mouth sores.  To relieve discomfort when you are eating: ? Try combinations of foods to see what you can tolerate. Aim for a balanced diet. ? Eat soft foods. These may be easier to swallow. ? Avoid foods and drinks that are salty, spicy, or acidic. ? Avoid alcohol. ? Try cold food and drinks, such as water, milk, milkshakes, frozen ice pops, slushies, and sherbets. Low-calorie sport drinks are good choices for staying hydrated. General instructions  Return to your normal activities as told by your health care provider. Ask your health care provider what activities are  safe for you.  Take or apply over-the-counter and prescription medicines only as told by your health care provider.  Wash your hands often with soap and water. If soap and water are not available, use hand sanitizer.  Stay away from work, schools, or other group settings during the first few days of the illness, or until your fever is gone.  Keep all follow-up visits as told by your health care provider. This is important. Contact a health care  provider if:  Your symptoms get worse or do not improve within 2 weeks.  You have pain that does not get better with medicine.  You feel very irritable.  You have trouble swallowing.  You develop sores or blisters on your lips or outside of your mouth.  You have a fever for more than 3 days. Get help right away if:  You develop signs of severe dehydration, such as: ? Decreased urination. This means urinating only very small amounts or urinating fewer than 3 times in a 24-hour period. ? Urine that is very dark. ? Dry mouth, tongue, or lips. ? Decreased tears or sunken eyes. ? Dry skin. ? Rapid breathing. ? Decreased activity or being very sleepy. ? Pale skin. ? Fingertips taking longer than 2 seconds to turn pink after a gentle squeeze. ? Weight loss.  You have a severe headache.  You have a stiff neck.  You experience changes in your behavior.  You have chest pain.  You have trouble breathing. Summary  Hand, foot, and mouth disease is a common viral illness.  This disease can spread from person to person (is contagious).  The illness often causes a sore throat, sores in the mouth, fever, and a rash on the hands and feet.  Typically, no treatment is needed for this condition. People usually get better within 2 weeks without treatment.  Get help right away if you develop signs of severe dehydration. This information is not intended to replace advice given to you by your health care provider. Make sure you discuss any questions you have with your health care provider. Document Released: 07/15/2016 Document Revised: 07/15/2016 Document Reviewed: 07/15/2016 Elsevier Interactive Patient Education  2018 [employer].

## 2018-05-04 ENCOUNTER — Ambulatory Visit: Payer: Medicaid Other | Admitting: Obstetrics

## 2018-05-04 ENCOUNTER — Other Ambulatory Visit: Payer: Self-pay | Admitting: Obstetrics

## 2018-05-04 ENCOUNTER — Institutional Professional Consult (permissible substitution): Payer: Medicaid Other

## 2018-05-04 DIAGNOSIS — G44201 Tension-type headache, unspecified, intractable: Secondary | ICD-10-CM

## 2018-05-13 ENCOUNTER — Ambulatory Visit (INDEPENDENT_AMBULATORY_CARE_PROVIDER_SITE_OTHER): Payer: Medicaid Other | Admitting: Clinical

## 2018-05-13 DIAGNOSIS — F419 Anxiety disorder, unspecified: Secondary | ICD-10-CM | POA: Diagnosis not present

## 2018-05-13 NOTE — BH Specialist Note (Addendum)
Integrated Behavioral Health Initial Visit  MRN: [id number] Name: Kathryn Randall  Number of [place] Clinician visits:: 1/6 Session Start time: 2:50  Session End time: 3:30 Total time: 40 minutes  Type of Service: [place] Interpretor:No. Interpretor Name and Language: n/a   Warm Hand Off Completed.       SUBJECTIVE: KYMARI Randall is a 27 y.o. female accompanied by n/a Patient was referred by Baltazar Najjar, MD for depression postpartum. Patient reports the following symptoms/concerns: Pt states her primary concern today is an increase in anxiety postpartum, along with fatigue and depression, that she attributes to life stress(conflict between family and FOB, loss of income; adjusting to new baby). Pt copes with her feelings best when skating.  Duration of problem: increase postpartum; Severity of problem: moderate  OBJECTIVE: Mood: Anxious and Affect: Appropriate Risk of harm to self or others: No plan to harm self or others  LIFE CONTEXT: Family and Social: Pt lives with newborn and FOB School/Work: Worked in catering and at [employer]; plans to return at an undetermined future date Self-Care: skating for self-care Life Changes: Recent childbirth; FOB lost job  GOALS ADDRESSED: Patient will: 1. Reduce symptoms of: anxiety, depression and stress 2. Increase knowledge and/or ability of: healthy habits and self-management skills  3. Demonstrate ability to: Increase healthy adjustment to current life circumstances and Increase adequate support systems for patient/family  INTERVENTIONS: Interventions utilized: Mindfulness or Psychologist, educational, Psychoeducation and/or Health Education and Link to [employer]  Standardized Assessments completed: Did not complete today  ASSESSMENT: Patient currently experiencing Anxiety disorder, unspecified   Patient may benefit from psychoeducation and brief  therapeutic interventions regarding coping with symptoms of anxiety and depression .  PLAN: 1. Follow up with behavioral health clinician on : As needed 2. Behavioral recommendations:  -Follow through on plan to skate at indoor rink weekly -CALM relaxation breathing exercise twice daily (morning; at bedtime) -Continue with plan to discuss [facility] medication management with PCP -Consider establishing care with [facility], for ongoing therapy, or therapist of choice -Read educational materials regarding coping with symptoms of anxiety and depression  3. Referral(s): [place] (In Clinic) and [place] (LME/Outside Clinic) 4. "From scale of 1-10, how likely are you to follow plan?": [place], LCSW

## 2018-05-14 ENCOUNTER — Ambulatory Visit (INDEPENDENT_AMBULATORY_CARE_PROVIDER_SITE_OTHER): Payer: Medicaid Other | Admitting: Obstetrics

## 2018-05-14 ENCOUNTER — Encounter: Payer: Self-pay | Admitting: Obstetrics

## 2018-05-14 DIAGNOSIS — F419 Anxiety disorder, unspecified: Secondary | ICD-10-CM

## 2018-05-14 DIAGNOSIS — G44201 Tension-type headache, unspecified, intractable: Secondary | ICD-10-CM

## 2018-05-14 DIAGNOSIS — F329 Major depressive disorder, single episode, unspecified: Secondary | ICD-10-CM

## 2018-05-14 MED ORDER — VITAFOL GUMMIES 3.33-0.333-34.8 MG PO CHEW: 3.0000 | CHEWABLE_TABLET | Freq: Every day | ORAL | 11 refills | Status: DC

## 2018-05-14 MED ORDER — BUTALBITAL-APAP-CAFFEINE 50-325-40 MG PO TABS: 2.0000 | ORAL_TABLET | Freq: Four times a day (QID) | ORAL | 2 refills | Status: AC | PRN

## 2018-05-14 NOTE — Progress Notes (Signed)
[place] Partum Exam  Kathryn Randall is a 27 y.o. G35P1001 female who presents for a postpartum visit. She is 5 weeks postpartum following a spontaneous vaginal delivery. I have fully reviewed the prenatal and intrapartum course. The delivery was at 73 gestational weeks.  Anesthesia: epidural. Postpartum course has been well. Baby's course has been well. Baby is feeding by bottle Dory Horn Soothing . Bleeding no bleeding. Bowel function is normal. Bladder function is normal. Patient is not sexually active. Contraception method is none. Postpartum depression screening:neg  The following portions of the patient's history were reviewed and updated as appropriate: allergies, current medications, past family history, past medical history, past social history, past surgical history and problem list. Last pap smear done 10/08/2017 and was Normal  Review of Systems A comprehensive review of systems was negative except for: Behavioral/Psych: positive for anxiety and depression    Objective:  Last menstrual period 07/20/2017, unknown if currently breastfeeding.  General:  alert and no distress   Breasts:  inspection negative, no nipple discharge or bleeding, no masses or nodularity palpable  Lungs: clear to auscultation bilaterally  Heart:  regular rate and rhythm, S1, S2 normal, no murmur, click, rub or gallop  Abdomen: soft, non-tender; bowel sounds normal; no masses,  no organomegaly   Assessment:    1. Postpartum examination following vaginal delivery Rx: - Prenatal Vit-Fe Phos-FA-Omega (VITAFOL GUMMIES) 3.33-0.333-34.8 MG CHEW; Chew 3 tablets by mouth daily before breakfast.  Dispense: 90 tablet; Refill: 11  2. Anxiety and depression - taking Effexor  3. Acute intractable tension-type headache Rx: - butalbital-acetaminophen-caffeine (FIORICET, ESGIC) 50-325-40 MG tablet; Take 2 tablets by mouth every 6 (six) hours as needed for headache.  Dispense: 30 tablet; Refill: 2 - Ambulatory referral to  Neurology   Plan:   1. Contraception: IUD 2. Continue PNV's 3. Follow up in: 4 weeks for ParaGuard IUD Insertion   Shelly Bombard MD 05-14-2018

## 2018-06-11 ENCOUNTER — Ambulatory Visit: Payer: Medicaid Other | Admitting: Obstetrics

## 2018-06-11 DIAGNOSIS — Z308 Encounter for other contraceptive management: Secondary | ICD-10-CM

## 2018-06-11 NOTE — Progress Notes (Signed)
Pt in office today for iud insertion. Pt states she had intercourse 2-3 days ago with no condom use.  Pt was made aware that she will need to abstain for 2 weeks and come back for IUD appt. Pt had Hcg-Qual drawn today and will return in 2 week for repeat testing and IUD placement.    I have reviewed the chart and agree with nursing staff's documentation of this patient's encounter.  Baltazar Najjar, MD 06/11/2018 3:06 PM

## 2018-06-12 LAB — HCG, SERUM, QUALITATIVE: hCG,Beta Subunit,Qual,Serum: NEGATIVE m[IU]/mL (ref <6)

## 2018-06-25 ENCOUNTER — Other Ambulatory Visit (HOSPITAL_COMMUNITY)
Admission: RE | Admit: 2018-06-25 | Discharge: 2018-06-25 | Disposition: A | Discharge status: Home / Self Care | Payer: Medicaid Other | Source: Ambulatory Visit | Attending: Obstetrics | Admitting: Obstetrics

## 2018-06-25 ENCOUNTER — Ambulatory Visit (INDEPENDENT_AMBULATORY_CARE_PROVIDER_SITE_OTHER): Payer: Medicaid Other | Admitting: Obstetrics

## 2018-06-25 ENCOUNTER — Encounter: Payer: Self-pay | Admitting: Obstetrics

## 2018-06-25 VITALS — BP 125/78 | HR 120 | Ht 64.0 in | Wt 145.6 lb

## 2018-06-25 DIAGNOSIS — Z30430 Encounter for insertion of intrauterine contraceptive device: Secondary | ICD-10-CM | POA: Diagnosis not present

## 2018-06-25 LAB — POCT URINE PREGNANCY: PREG TEST UR: NEGATIVE

## 2018-06-25 MED ORDER — PARAGARD INTRAUTERINE COPPER IU IUD
INTRAUTERINE_SYSTEM | Freq: Once | INTRAUTERINE | Status: AC
  Administered 2018-06-25: 16:00:00 via INTRAUTERINE

## 2018-06-25 NOTE — Progress Notes (Signed)
IUD Insertion Procedure Note  Pre-operative Diagnosis: Desires Long Term Reversible Contraception ( LARC )  Post-operative Diagnosis: same  Indications: contraception  Procedure Details  Urine pregnancy test was done in the office and result was negative.  The risks (including infection, bleeding, pain, and uterine perforation) and benefits of the procedure were explained to the patient and Written informed consent was obtained.    Cervix cleansed with Betadine. Uterus sounded to 8 cm. IUD inserted without difficulty. String visible and trimmed. Patient tolerated procedure well.  IUD Information: ParaGard, Lot # [id number], Expiration date: JUL  2025.  Condition: Stable  Complications: None  Plan:  The patient was advised to call for any fever or for prolonged or severe pain or bleeding. She was advised to use NSAID as needed for mild to moderate pain.   Attending Physician Documentation: I was present for or participated in the entire procedure, including opening and closing.   Shelly Bombard MD 06-25-2018

## 2018-06-26 LAB — CERVICOVAGINAL ANCILLARY ONLY
Bacterial vaginitis: NEGATIVE
CHLAMYDIA, DNA PROBE: NEGATIVE
Candida vaginitis: NEGATIVE
Neisseria Gonorrhea: NEGATIVE
Trichomonas: NEGATIVE

## 2018-06-30 ENCOUNTER — Other Ambulatory Visit: Payer: Self-pay | Admitting: Obstetrics

## 2018-06-30 DIAGNOSIS — G44201 Tension-type headache, unspecified, intractable: Secondary | ICD-10-CM

## 2018-08-03 ENCOUNTER — Ambulatory Visit: Payer: Self-pay | Admitting: Family Medicine

## 2018-08-05 ENCOUNTER — Ambulatory Visit: Payer: Medicaid Other | Admitting: Obstetrics

## 2018-08-19 ENCOUNTER — Encounter: Payer: Self-pay | Admitting: Obstetrics

## 2018-08-19 ENCOUNTER — Other Ambulatory Visit: Payer: Self-pay

## 2018-08-19 ENCOUNTER — Ambulatory Visit (INDEPENDENT_AMBULATORY_CARE_PROVIDER_SITE_OTHER): Payer: Medicaid Other | Admitting: Obstetrics

## 2018-08-19 VITALS — BP 106/71 | HR 93 | Ht 64.0 in | Wt 144.8 lb

## 2018-08-19 DIAGNOSIS — N946 Dysmenorrhea, unspecified: Secondary | ICD-10-CM

## 2018-08-19 DIAGNOSIS — Z30431 Encounter for routine checking of intrauterine contraceptive device: Secondary | ICD-10-CM | POA: Diagnosis not present

## 2018-08-19 MED ORDER — IBUPROFEN 800 MG PO TABS: 800.0000 mg | ORAL_TABLET | Freq: Three times a day (TID) | ORAL | 5 refills | Status: DC | PRN

## 2018-08-19 NOTE — Progress Notes (Signed)
Subjective:    Kathryn Randall is a 28 y.o. female who presents for IUD Surveillance. The patient has no complaints today. The patient is sexually active. Pertinent past medical history: none.  The information documented in the HPI was reviewed and verified.  Menstrual History: OB History    Gravida  2   Para  1   Term  1   Preterm  0   AB  0   Living  1     SAB  0   TAB  0   Ectopic  0   Multiple  0   Live Births  1            Patient's last menstrual period was 07/20/2018 (exact date).   Patient Active Problem List   Diagnosis Date Noted  . SVD (spontaneous vaginal delivery) 04/03/2018  . Supervision of normal first pregnancy, antepartum 10/08/2017  . Dental infection 02/04/2016  . 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 Insomnia 02/21/2013  . Cerumen impaction 06/28/2012  . Neck strain 06/28/2012  . Cervical cancer screening 05/26/2012  . Overweight 04/27/2012  . Tachycardia 11/14/2011  . Anemia 11/14/2011  . Hypokalemia 11/14/2011  . Anxiety and depression 07/17/2011  . Migraine 06/28/2011  . Allergic state 06/28/2011  . Back pain    Past Medical History:  Diagnosis Date  . Allergic state 06/28/2011  . Anemia 11/14/2011  . Anxiety   . Anxiety and depression 07/17/2011  . Back pain   . Cervical cancer screening 05/26/2012  . Dental infection 02/04/2016  . Depression    Pt. States "disorder is being called bipolar"  . Dysmenorrhea 05/26/2012  . Hypokalemia 11/14/2011  . Insomnia 02/21/2013  . Migraine 06/28/2011  . Neck strain 06/28/2012  . Overweight(278.02) 04/27/2012  . Preventative health care 06/28/2011  . Tachycardia 11/14/2011    Past Surgical History:  Procedure Laterality Date  . WISDOM TOOTH EXTRACTION  28 yrs old     Current Outpatient Medications:  .  acetaminophen (TYLENOL) 325 MG tablet, Take 2 tablets (650 mg total) by mouth every 4 (four) hours as needed. (Patient not taking: Reported on 04/20/2018), Disp: 100 tablet, Rfl: 2 .   acetaminophen (TYLENOL) 500 MG tablet, Take 500 mg by mouth every 4 (four) hours as needed for mild pain or headache., Disp: , Rfl:  .  butalbital-acetaminophen-caffeine (FIORICET, ESGIC) 50-325-40 MG tablet, Take 2 tablets by mouth every 6 (six) hours as needed for headache., Disp: 30 tablet, Rfl: 2 .  dibucaine (NUPERCAINAL) 1 % OINT, Place 1 application rectally as needed for hemorrhoids. (Patient not taking: Reported on 04/20/2018), Disp: 28 g, Rfl: 0 .  ibuprofen (ADVIL,MOTRIN) 800 MG tablet, Take 1 tablet (800 mg total) by mouth every 8 (eight) hours as needed., Disp: 30 tablet, Rfl: 5 .  omeprazole (PRILOSEC) 20 MG capsule, Take 1 capsule (20 mg total) by mouth 2 (two) times daily before a meal. (Patient not taking: Reported on 06/25/2018), Disp: 60 capsule, Rfl: 5 .  Prenatal Vit-Fe Phos-FA-Omega (VITAFOL GUMMIES) 3.33-0.333-34.8 MG CHEW, Chew 3 tablets by mouth daily before breakfast., Disp: 90 tablet, Rfl: 11 .  venlafaxine XR (EFFEXOR-XR) 75 MG 24 hr capsule, TAKE 3 CAPSULES BY MOUTH DAILY WITH BREAKFAST, Disp: 90 capsule, Rfl: 6 .  witch hazel-glycerin (TUCKS) pad, Apply 1 application topically as needed for hemorrhoids. (Patient not taking: Reported on 04/20/2018), Disp: 40 each, Rfl: 12 Allergies  Allergen Reactions  . Ibuprofen     Racing heart- when used in large doses   .  Morphine And Related Hives and Itching    Social History   Tobacco Use  . Smoking status: Never Smoker  . Smokeless tobacco: Never Used  . Tobacco comment: black and milds every once in awhile  Substance Use Topics  . Alcohol use: No    Family History  Problem Relation Age of Onset  . Migraines Mother   . Arthritis Mother   . Hearing loss Mother   . Menstrual problems Mother   . Migraines Father   . Obesity Maternal Grandmother   . Heart disease Maternal Grandmother        CHF  . Hypertension Maternal Grandmother   . Hearing loss Maternal Grandmother   . Arthritis Maternal Grandmother   .  Menstrual problems Maternal Grandmother   . Diabetes Maternal Grandfather        type 2  . Obesity Maternal Grandfather   . Leukemia Paternal Grandfather   . Migraines Paternal Grandfather   . Cancer Paternal Grandfather        leukemia  . Ovarian cysts Paternal Aunt        Review of Systems Constitutional: negative for weight loss Genitourinary:negative for abnormal menstrual periods and vaginal discharge   Objective:   BP 106/71   Pulse 93   Ht 5\' 4"  (1.626 m)   Wt 144 lb 12.8 oz (65.7 kg)   LMP 07/20/2018 (Exact Date)   BMI 24.85 kg/m   PE:          General:  Alert and no distress Abdomen:  normal findings: no organomegaly, soft, non-tender and no hernia  Pelvis:  External genitalia: normal general appearance Urinary system: urethral meatus normal and bladder without fullness, nontender Vaginal: normal without tenderness, induration or masses Cervix: normal appearance.  IUD string visible, normal length Adnexa: normal bimanual exam Uterus: anteverted and non-tender, normal size   Lab Review Urine pregnancy test Labs reviewed yes Radiologic studies reviewed no  50% of 15 min visit spent on counseling and coordination of care.    Assessment:    28 y.o., continuing ParaGuard IUD, no contraindications.   Plan:   1. IUD check up - DOING WELL  2. Severe dysmenorrhea Rx: - ibuprofen (ADVIL,MOTRIN) 800 MG tablet; Take 1 tablet (800 mg total) by mouth every 8 (eight) hours as needed.  Dispense: 30 tablet; Refill: 5   All questions answered. Contraception: IUD. Discussed healthy lifestyle modifications. Follow up in 3 months.   Meds ordered this encounter  Medications  . ibuprofen (ADVIL,MOTRIN) 800 MG tablet    Sig: Take 1 tablet (800 mg total) by mouth every 8 (eight) hours as needed.    Dispense:  30 tablet    Refill:  5   No orders of the defined types were placed in this encounter.   Shelly Bombard MD 08-19-2018

## 2018-08-19 NOTE — Progress Notes (Signed)
Presents for [employer].  C/o heavier periods and cramps 7/10 x 1 month.

## 2018-10-01 ENCOUNTER — Other Ambulatory Visit: Payer: Self-pay | Admitting: Obstetrics

## 2018-10-01 DIAGNOSIS — F32A Depression, unspecified: Secondary | ICD-10-CM

## 2018-10-01 DIAGNOSIS — F419 Anxiety disorder, unspecified: Principal | ICD-10-CM

## 2018-10-01 DIAGNOSIS — F329 Major depressive disorder, single episode, unspecified: Secondary | ICD-10-CM

## 2018-10-01 NOTE — Telephone Encounter (Signed)
Refill request on Venlafxine 75mg 

## 2018-10-06 ENCOUNTER — Encounter: Payer: Self-pay | Admitting: Family Medicine

## 2018-10-08 NOTE — Telephone Encounter (Signed)
Sorry about that Dr. [place] was the last physician.  Patient does not currently have a PCP

## 2018-10-08 NOTE — Telephone Encounter (Signed)
Dr. Was the [facility] physician she saw in 2019. We have since dismissed this patient, Dr. Charlett Blake has not seen her since 2017, she is no longer her patient

## 2019-09-14 ENCOUNTER — Encounter: Payer: Self-pay | Admitting: Obstetrics

## 2019-09-14 ENCOUNTER — Ambulatory Visit (INDEPENDENT_AMBULATORY_CARE_PROVIDER_SITE_OTHER): Payer: Medicaid Other | Admitting: Obstetrics

## 2019-09-14 ENCOUNTER — Other Ambulatory Visit: Payer: Self-pay

## 2019-09-14 DIAGNOSIS — Z30011 Encounter for initial prescription of contraceptive pills: Secondary | ICD-10-CM | POA: Diagnosis not present

## 2019-09-14 DIAGNOSIS — F32A Depression, unspecified: Secondary | ICD-10-CM

## 2019-09-14 DIAGNOSIS — Z30432 Encounter for removal of intrauterine contraceptive device: Secondary | ICD-10-CM | POA: Diagnosis not present

## 2019-09-14 DIAGNOSIS — G43109 Migraine with aura, not intractable, without status migrainosus: Secondary | ICD-10-CM

## 2019-09-14 DIAGNOSIS — Z3009 Encounter for other general counseling and advice on contraception: Secondary | ICD-10-CM

## 2019-09-14 DIAGNOSIS — N946 Dysmenorrhea, unspecified: Secondary | ICD-10-CM

## 2019-09-14 DIAGNOSIS — Z30431 Encounter for routine checking of intrauterine contraceptive device: Secondary | ICD-10-CM

## 2019-09-14 DIAGNOSIS — F329 Major depressive disorder, single episode, unspecified: Secondary | ICD-10-CM

## 2019-09-14 MED ORDER — BUTALBITAL-APAP-CAFFEINE 50-325-40 MG PO TABS: 1.0000 | ORAL_TABLET | Freq: Four times a day (QID) | ORAL | 2 refills | Status: AC | PRN

## 2019-09-14 MED ORDER — IBUPROFEN 800 MG PO TABS: 800.0000 mg | ORAL_TABLET | Freq: Three times a day (TID) | ORAL | 5 refills | Status: DC | PRN

## 2019-09-14 MED ORDER — VENLAFAXINE HCL ER 75 MG PO CP24: ORAL_CAPSULE | ORAL | 11 refills | Status: AC

## 2019-09-14 MED ORDER — SLYND 4 MG PO TABS: 1.0000 | ORAL_TABLET | Freq: Every day | ORAL | 11 refills | Status: DC

## 2019-09-14 MED ORDER — VITAFOL GUMMIES 3.33-0.333-34.8 MG PO CHEW: 3.0000 | CHEWABLE_TABLET | Freq: Every day | ORAL | 11 refills | Status: DC

## 2019-09-14 NOTE — Progress Notes (Signed)
Subjective:    Kathryn Randall is a 29 y.o. female who presents for contraception counseling. The patient has complaints of severe cramping, nausea and heavy bleeding with periods.  The ParaGuard IUD has been in for a year and periods seem to be getting worse. The patient is sexually active. Pertinent past medical history: migraines with aura.  The information documented in the HPI was reviewed and verified.  Menstrual History: OB History    Gravida  2   Para  1   Term  1   Preterm  0   AB  0   Living  1     SAB  0   TAB  0   Ectopic  0   Multiple  0   Live Births  1            Patient's last menstrual period was 09/12/2019 (exact date).   Patient Active Problem List   Diagnosis Date Noted  . SVD (spontaneous vaginal delivery) 04/03/2018  . Supervision of normal first pregnancy, antepartum 10/08/2017  . Dental infection 02/04/2016  . 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 Insomnia 02/21/2013  . Cerumen impaction 06/28/2012  . Neck strain 06/28/2012  . Cervical cancer screening 05/26/2012  . Overweight 04/27/2012  . Tachycardia 11/14/2011  . Anemia 11/14/2011  . Hypokalemia 11/14/2011  . Anxiety and depression 07/17/2011  . Migraine 06/28/2011  . Allergic state 06/28/2011  . Back pain    Past Medical History:  Diagnosis Date  . Allergic state 06/28/2011  . Anemia 11/14/2011  . Anxiety   . Anxiety and depression 07/17/2011  . Back pain   . Cervical cancer screening 05/26/2012  . Dental infection 02/04/2016  . Depression    Pt. States "disorder is being called bipolar"  . Dysmenorrhea 05/26/2012  . Hypokalemia 11/14/2011  . Insomnia 02/21/2013  . Migraine 06/28/2011  . Neck strain 06/28/2012  . Overweight(278.02) 04/27/2012  . Preventative health care 06/28/2011  . Tachycardia 11/14/2011    Past Surgical History:  Procedure Laterality Date  . WISDOM TOOTH EXTRACTION  29 yrs old     Current Outpatient Medications:  .  venlafaxine XR (EFFEXOR-XR) 75  MG 24 hr capsule, TAKE THREE CAPSULES BY MOUTH EVERY MORNING WITH BREAKFAST, Disp: 90 capsule, Rfl: 11 .  acetaminophen (TYLENOL) 500 MG tablet, Take 500 mg by mouth every 4 (four) hours as needed for mild pain or headache., Disp: , Rfl:  .  butalbital-acetaminophen-caffeine (FIORICET) 50-325-40 MG tablet, Take 1-2 tablets by mouth every 6 (six) hours as needed for headache., Disp: 30 tablet, Rfl: 2 .  dibucaine (NUPERCAINAL) 1 % OINT, Place 1 application rectally as needed for hemorrhoids. (Patient not taking: Reported on 04/20/2018), Disp: 28 g, Rfl: 0 .  Drospirenone (SLYND) 4 MG TABS, Take 1 tablet by mouth daily before breakfast., Disp: 28 tablet, Rfl: 11 .  ibuprofen (ADVIL) 800 MG tablet, Take 1 tablet (800 mg total) by mouth every 8 (eight) hours as needed., Disp: 30 tablet, Rfl: 5 .  omeprazole (PRILOSEC) 20 MG capsule, Take 1 capsule (20 mg total) by mouth 2 (two) times daily before a meal. (Patient not taking: Reported on 06/25/2018), Disp: 60 capsule, Rfl: 5 .  Prenatal Vit-Fe Phos-FA-Omega (VITAFOL GUMMIES) 3.33-0.333-34.8 MG CHEW, Chew 3 tablets by mouth daily before breakfast., Disp: 90 tablet, Rfl: 11 .  witch hazel-glycerin (TUCKS) pad, Apply 1 application topically as needed for hemorrhoids. (Patient not taking: Reported on 04/20/2018), Disp: 40 each, Rfl: 12 Allergies  Allergen Reactions  .  Ibuprofen     Racing heart- when used in large doses   . Morphine And Related Hives and Itching    Social History   Tobacco Use  . Smoking status: Never Smoker  . Smokeless tobacco: Never Used  . Tobacco comment: black and milds every once in awhile  Substance Use Topics  . Alcohol use: No    Family History  Problem Relation Age of Onset  . Migraines Mother   . Arthritis Mother   . Hearing loss Mother   . Menstrual problems Mother   . Migraines Father   . Obesity Maternal Grandmother   . Heart disease Maternal Grandmother        CHF  . Hypertension Maternal Grandmother   .  Hearing loss Maternal Grandmother   . Arthritis Maternal Grandmother   . Menstrual problems Maternal Grandmother   . Diabetes Maternal Grandfather        type 2  . Obesity Maternal Grandfather   . Leukemia Paternal Grandfather   . Migraines Paternal Grandfather   . Cancer Paternal Grandfather        leukemia  . Ovarian cysts Paternal Aunt        Review of Systems Constitutional: negative for weight loss Genitourinary:negative for abnormal menstrual periods and vaginal discharge   Objective:   BP 117/80   Pulse (!) 101   Ht 5\' 3"  (1.6 m)   Wt 133 lb 11.2 oz (60.6 kg)   LMP 09/12/2019 (Exact Date)   Breastfeeding No   BMI 23.68 kg/m    General:   alert  Skin:   no rash or abnormalities  Lungs:   clear to auscultation bilaterally  Heart:   regular rate and rhythm, S1, S2 normal, no murmur, click, rub or gallop  Breasts:   normal without suspicious masses, skin or nipple changes or axillary nodes  Abdomen:  normal findings: no organomegaly, soft, non-tender and no hernia  Pelvis:  External genitalia: normal general appearance Urinary system: urethral meatus normal and bladder without fullness, nontender Vaginal: normal without tenderness, induration or masses Cervix: normal appearance Adnexa: normal bimanual exam Uterus: anteverted and non-tender, normal size   Lab Review Urine pregnancy test Labs reviewed yes Radiologic studies reviewed no  50% of 20 min visit spent on counseling and coordination of care.    Assessment:    29 y.o., discontinuing IUD and starting progestin-only contraceptive pills ( Slynd ), no contraindications.   Plan:   1. Postpartum examination following vaginal delivery Rx: - Prenatal Vit-Fe Phos-FA-Omega (VITAFOL GUMMIES) 3.33-0.333-34.8 MG CHEW; Chew 3 tablets by mouth daily before breakfast.  Dispense: 90 tablet; Refill: 11  2. IUD check up - wants IUD removed  3. Anxiety and depression Rx: - venlafaxine XR (EFFEXOR-XR) 75 MG 24  hr capsule; TAKE THREE CAPSULES BY MOUTH EVERY MORNING WITH BREAKFAST  Dispense: 90 capsule; Refill: 11  4. Severe dysmenorrhea Rx: - ibuprofen (ADVIL) 800 MG tablet; Take 1 tablet (800 mg total) by mouth every 8 (eight) hours as needed.  Dispense: 30 tablet; Refill: 5  5. Migraine with aura and without status migrainosus, not intractable Rx: - butalbital-acetaminophen-caffeine (FIORICET) 50-325-40 MG tablet; Take 1-2 tablets by mouth every 6 (six) hours as needed for headache.  Dispense: 30 tablet; Refill: 2  6. Encounter for other general counseling and advice on contraception - discussed options, with a history of migraines with aura - non-estrogen contraceptives recommended and agreed on  7. Encounter for IUD removal  8. Encounter for initial prescription  of progestin-only contraceptive pills Rx: - Drospirenone (SLYND) 4 MG TABS; Take 1 tablet by mouth daily before breakfast.  Dispense: 28 tablet; Refill: 11    All questions answered. Contraception: oral progesterone-only contraceptive. Discussed healthy lifestyle modifications. Follow up in 3 months.    Meds ordered this encounter  Medications  . Drospirenone (SLYND) 4 MG TABS    Sig: Take 1 tablet by mouth daily before breakfast.    Dispense:  28 tablet    Refill:  11  . butalbital-acetaminophen-caffeine (FIORICET) 50-325-40 MG tablet    Sig: Take 1-2 tablets by mouth every 6 (six) hours as needed for headache.    Dispense:  30 tablet    Refill:  2  . Prenatal Vit-Fe Phos-FA-Omega (VITAFOL GUMMIES) 3.33-0.333-34.8 MG CHEW    Sig: Chew 3 tablets by mouth daily before breakfast.    Dispense:  90 tablet    Refill:  11  . venlafaxine XR (EFFEXOR-XR) 75 MG 24 hr capsule    Sig: TAKE THREE CAPSULES BY MOUTH EVERY MORNING WITH BREAKFAST    Dispense:  90 capsule    Refill:  11  . ibuprofen (ADVIL) 800 MG tablet    Sig: Take 1 tablet (800 mg total) by mouth every 8 (eight) hours as needed.    Dispense:  30 tablet     Refill:  5   No orders of the defined types were placed in this encounter.  Shelly Bombard, MD 09/14/2019 11:02 AM       GYNECOLOGY OFFICE PROCEDURE NOTE  Kathryn Randall is a 29 y.o. G2P1001 here for [place] IUD removal. No GYN concerns.  Last pap smear was on 10-08-2017 and was normal.  IUD Removal  Patient identified, informed consent performed, consent signed.  Patient was in the dorsal lithotomy position, normal external genitalia was noted.  A speculum was placed in the patient's vagina, normal discharge was noted, no lesions. The cervix was visualized, no lesions, no abnormal discharge.  The strings of the IUD were grasped and pulled using ring forceps. The IUD was removed in its entirety.   Patient tolerated the procedure well.    Patient will use progestin-only pill for contraception.  Routine preventative health maintenance measures emphasized.   Shelly Bombard , MD, [place] for [facility], [facility] 09/14/2019

## 2019-09-14 NOTE — Progress Notes (Signed)
Pt is here for annual gyn exam. Last pap 10/08/17, normal. Pt reports she is on day 2 of her cycle and bleeding fairly heavy. Pt had IUD inserted in 06/2018, she report that her periods have been worse since she got it. Pt informed that we will change todays visit type to IUD check and bring her back for annual exam when she is not on her cycle, pt voices understanding.

## 2023-08-06 ENCOUNTER — Emergency Department (HOSPITAL_COMMUNITY)
Admission: EM | Admit: 2023-08-06 | Discharge: 2023-08-06 | Disposition: A | Discharge status: Home / Self Care | Payer: BLUE CROSS/BLUE SHIELD | Attending: Emergency Medicine | Admitting: Emergency Medicine

## 2023-08-06 ENCOUNTER — Encounter (HOSPITAL_COMMUNITY): Payer: Self-pay

## 2023-08-06 ENCOUNTER — Emergency Department (HOSPITAL_COMMUNITY): Payer: BLUE CROSS/BLUE SHIELD

## 2023-08-06 ENCOUNTER — Other Ambulatory Visit: Payer: Self-pay

## 2023-08-06 DIAGNOSIS — O039 Complete or unspecified spontaneous abortion without complication: Secondary | ICD-10-CM | POA: Insufficient documentation

## 2023-08-06 DIAGNOSIS — O209 Hemorrhage in early pregnancy, unspecified: Secondary | ICD-10-CM | POA: Diagnosis present

## 2023-08-06 LAB — CBC WITH DIFFERENTIAL/PLATELET
Abs Immature Granulocytes: 0.02 10*3/uL (ref 0.00–0.07)
Basophils Absolute: 0.1 10*3/uL (ref 0.0–0.1)
Basophils Relative: 1 %
Eosinophils Absolute: 0 10*3/uL (ref 0.0–0.5)
Eosinophils Relative: 1 %
HCT: 36.6 % (ref 36.0–46.0)
Hemoglobin: 12.3 g/dL (ref 12.0–15.0)
Immature Granulocytes: 0 %
Lymphocytes Relative: 33 %
Lymphs Abs: 2 10*3/uL (ref 0.7–4.0)
MCH: 31.8 pg (ref 26.0–34.0)
MCHC: 33.6 g/dL (ref 30.0–36.0)
MCV: 94.6 fL (ref 80.0–100.0)
Monocytes Absolute: 0.4 10*3/uL (ref 0.1–1.0)
Monocytes Relative: 7 %
Neutro Abs: 3.4 10*3/uL (ref 1.7–7.7)
Neutrophils Relative %: 58 %
Platelets: 303 10*3/uL (ref 150–400)
RBC: 3.87 MIL/uL (ref 3.87–5.11)
RDW: 12 % (ref 11.5–15.5)
WBC: 5.9 10*3/uL (ref 4.0–10.5)
nRBC: 0 % (ref 0.0–0.2)

## 2023-08-06 LAB — URINALYSIS, W/ REFLEX TO CULTURE (INFECTION SUSPECTED)
Bilirubin Urine: NEGATIVE
Glucose, UA: NEGATIVE mg/dL
Ketones, ur: NEGATIVE mg/dL
Nitrite: NEGATIVE
Protein, ur: 30 mg/dL — AB
RBC / HPF: >50 RBC/hpf (ref 0–5)
Specific Gravity, Urine: 1.02 (ref 1.005–1.030)
pH: 7 (ref 5.0–8.0)

## 2023-08-06 LAB — COMPREHENSIVE METABOLIC PANEL
ALT: 11 U/L (ref 0–44)
AST: 16 U/L (ref 15–41)
Albumin: 3.5 g/dL (ref 3.5–5.0)
Alkaline Phosphatase: 42 U/L (ref 38–126)
Anion gap: 7 (ref 5–15)
BUN: 7 mg/dL (ref 6–20)
CO2: 22 mmol/L (ref 22–32)
Calcium: 8.5 mg/dL — ABNORMAL LOW (ref 8.9–10.3)
Chloride: 107 mmol/L (ref 98–111)
Creatinine, Ser: 0.75 mg/dL (ref 0.44–1.00)
GFR, Estimated: >60 mL/min (ref >60)
Glucose, Bld: 88 mg/dL (ref 70–99)
Potassium: 3.7 mmol/L (ref 3.5–5.1)
Sodium: 136 mmol/L (ref 135–145)
Total Bilirubin: 0.2 mg/dL (ref 0.0–1.2)
Total Protein: 6.4 g/dL — ABNORMAL LOW (ref 6.5–8.1)

## 2023-08-06 LAB — TYPE AND SCREEN
ABO/RH(D): B POS
Antibody Screen: NEGATIVE

## 2023-08-06 LAB — HCG, QUANTITATIVE, PREGNANCY: hCG, Beta Chain, Quant, S: 13980 m[IU]/mL — ABNORMAL HIGH (ref <5)

## 2023-08-06 NOTE — ED Provider Notes (Signed)
 [facility] AT [facility] Provider Note   CSN: [id number] Arrival date & time: 08/06/23  1003     History  Chief Complaint  Patient presents with   Vaginal Bleeding    VITALIA STOUGH is a 33 y.o. female.  33 year old female who is here today for vaginal bleeding.  Patient is [redacted] weeks pregnant.  She is a G2, P1.   Vaginal Bleeding      Home Medications Prior to Admission medications   Medication Sig Start Date End Date Taking? Authorizing Provider  acetaminophen (TYLENOL) 500 MG tablet Take 500 mg by mouth every 4 (four) hours as needed for mild pain or headache.    [provider]  dibucaine (NUPERCAINAL) 1 % OINT Place 1 application rectally as needed for hemorrhoids. Patient not taking: Reported on 04/20/2018 04/06/18   Calvert Cantor, CNM  Drospirenone (SLYND) 4 MG TABS Take 1 tablet by mouth daily before breakfast. 09/14/19   Brock Bad, MD  ibuprofen (ADVIL) 800 MG tablet Take 1 tablet (800 mg total) by mouth every 8 (eight) hours as needed. 09/14/19   Brock Bad, MD  omeprazole (PRILOSEC) 20 MG capsule Take 1 capsule (20 mg total) by mouth 2 (two) times daily before a meal. Patient not taking: Reported on 06/25/2018 03/03/18   Brock Bad, MD  Prenatal Vit-Fe Phos-FA-Omega (VITAFOL GUMMIES) 3.33-0.333-34.8 MG CHEW Chew 3 tablets by mouth daily before breakfast. 09/14/19   Brock Bad, MD  venlafaxine XR (EFFEXOR-XR) 75 MG 24 hr capsule TAKE THREE CAPSULES BY MOUTH EVERY MORNING WITH BREAKFAST 09/14/19   Brock Bad, MD  witch hazel-glycerin (TUCKS) pad Apply 1 application topically as needed for hemorrhoids. Patient not taking: Reported on 04/20/2018 04/06/18   Calvert Cantor, CNM      Allergies    Ibuprofen and Morphine and codeine    Review of Systems   Review of Systems  Genitourinary:  Positive for vaginal bleeding.    Physical Exam Updated Vital Signs BP 112/75   Pulse (!) 105    Temp 98.4 F (36.9 C)   Resp 17   Ht 5\' 4"  (1.626 m)   Wt 66.7 kg   SpO2 100%   BMI 25.23 kg/m  Physical Exam Vitals reviewed.  Abdominal:     General: Abdomen is flat. There is no distension.     Palpations: Abdomen is soft.     Tenderness: There is no abdominal tenderness.     ED Results / Procedures / Treatments   Labs (all labs ordered are listed, but only abnormal results are displayed) Labs Reviewed  HCG, QUANTITATIVE, PREGNANCY - Abnormal; Notable for the following components:      Result Value   hCG, Beta Chain, Quant, S 13,980 (*)    All other components within normal limits  URINALYSIS, W/ REFLEX TO CULTURE (INFECTION SUSPECTED) - Abnormal; Notable for the following components:   APPearance HAZY (*)    Hgb urine dipstick LARGE (*)    Protein, ur 30 (*)    Leukocytes,Ua TRACE (*)    Bacteria, UA RARE (*)    All other components within normal limits  COMPREHENSIVE METABOLIC PANEL - Abnormal; Notable for the following components:   Calcium 8.5 (*)    Total Protein 6.4 (*)    All other components within normal limits  CBC WITH DIFFERENTIAL/PLATELET  TYPE AND SCREEN    EKG None  Radiology US OB LESS THAN 14 WEEKS WITH OB TRANSVAGINAL  Result Date: 08/06/2023 CLINICAL DATA:  Vaginal bleeding. Estimated gestational age [redacted] weeks, 4 days by LMP. EXAM: OBSTETRIC <14 WK [place] AND TRANSVAGINAL OB US TECHNIQUE: Both transabdominal and transvaginal ultrasound examinations were performed for complete evaluation of the gestation as well as the maternal uterus, adnexal regions, and pelvic cul-de-sac. Transvaginal technique was performed to assess early pregnancy. COMPARISON:  None Available. FINDINGS: Intrauterine gestational sac: Single. Yolk sac:  Not Visualized. Embryo:  Not Visualized. MSD: 32.1 mm   8 w   3 d Subchorionic hemorrhage:  None visualized. Maternal uterus/adnexae: Unremarkable.  Left ovarian corpus luteum. IMPRESSION: 1. Findings meet definitive criteria for  failed pregnancy. This follows SRU consensus guidelines: Diagnostic Criteria for Nonviable Pregnancy Early in the First Trimester. [facility] [phone]. Electronically Signed   By: Obie Dredge M.D.   On: 08/06/2023 15:27    Procedures Procedures    Medications Ordered in ED Medications - No data to display  ED Course/ Medical Decision Making/ A&P                                 Medical Decision Making 33 year old female here today for vaginal bleeding.  Differential diagnoses include spontaneous abortion, ectopic pregnancy.  Plan -patient evaluated in triage by myself.  Attempted transabdominal ultrasound which showed no intrauterine pregnancy.  Patient's hCG resulted and was 13,000.  Her transvaginal ultrasound was consistent with loss pregnancy.  I discussed this with the patient and her partner at bedside.  Answered all questions.  They are going to follow-up in 48 hours for a repeat hCG to ensure it is downtrending.  Rh+.  No indication for RhoGAM.  Husband also Rh+.  Amount and/or Complexity of Data Reviewed Labs: ordered. Radiology: ordered.           Final Clinical Impression(s) / ED Diagnoses Final diagnoses:  Spontaneous abortion at 8 to [redacted] weeks gestation    Rx / DC Orders ED Discharge Orders     None         Arletha Pili, DO 08/06/23 1614

## 2023-08-06 NOTE — ED Provider Triage Note (Signed)
 Emergency Medicine Provider Triage Evaluation Note  Kathryn Randall , a 33 y.o. female  was evaluated in triage.  Pt complains of vaginal bleeding.  Patient G2, P1.  Last menstrual period was November 28.  Began to have light bleeding over the last few days.  Her of blood type..  Review of Systems  Positive:  Negative:   Physical Exam  BP 110/71   Pulse (!) 123   Temp 98.4 F (36.9 C)   Resp 16   Ht 5\' 4"  (1.626 m)   Wt 66.7 kg   SpO2 100%   BMI 25.23 kg/m  Gen:   Awake, no distress   Resp:  Normal effort  MSK:   Moves extremities without difficulty  Other:    Medical Decision Making  Medically screening exam initiated at 11:10 AM.  Appropriate orders placed.  Kathryn Randall was informed that the remainder of the evaluation will be completed by another provider, this initial triage assessment does not replace that evaluation, and the importance of remaining in the ED until their evaluation is complete.  Did bedside ultrasound which unfortunately was not able to visualize intrauterine pregnancy.  Blood work ordered, ultrasound ordered.   Kathryn Randall T, DO 08/06/23 [phone]

## 2023-08-06 NOTE — ED Triage Notes (Addendum)
 Pt is [redacted] weeks pregnant and started spotting brown blood a week ago and today pt started bleeding dark red. Denies clots. Denies urinary issues. Pt states she felt baby move yesterday. Pt states she has not seen OBGYN since finding out she was pregnant.

## 2023-08-06 NOTE — Discharge Instructions (Addendum)
 Your hCG today was 13,980.  Your pregnancy was consistent with early pregnancy loss.  It is very important that you follow-up in 48 hours for repeat hCG.  You can go to any emergency room, or return here to have that done.  Return to the emergency room if you develop any sudden worsening abdominal pain or lose consciousness.

## 2023-08-10 ENCOUNTER — Encounter (HOSPITAL_COMMUNITY): Payer: Self-pay | Admitting: Obstetrics & Gynecology

## 2023-08-10 ENCOUNTER — Inpatient Hospital Stay (HOSPITAL_COMMUNITY)

## 2023-08-10 ENCOUNTER — Inpatient Hospital Stay (HOSPITAL_COMMUNITY)
Admission: AD | Admit: 2023-08-10 | Discharge: 2023-08-10 | Disposition: A | Discharge status: Home / Self Care | Attending: Obstetrics & Gynecology | Admitting: Obstetrics & Gynecology

## 2023-08-10 DIAGNOSIS — N939 Abnormal uterine and vaginal bleeding, unspecified: Secondary | ICD-10-CM | POA: Diagnosis not present

## 2023-08-10 DIAGNOSIS — O021 Missed abortion: Secondary | ICD-10-CM | POA: Diagnosis present

## 2023-08-10 DIAGNOSIS — Z3A00 Weeks of gestation of pregnancy not specified: Secondary | ICD-10-CM

## 2023-08-10 DIAGNOSIS — D500 Iron deficiency anemia secondary to blood loss (chronic): Secondary | ICD-10-CM

## 2023-08-10 LAB — URINALYSIS, ROUTINE W REFLEX MICROSCOPIC
Bilirubin Urine: NEGATIVE
Glucose, UA: NEGATIVE mg/dL
Ketones, ur: NEGATIVE mg/dL
Nitrite: NEGATIVE
Protein, ur: NEGATIVE mg/dL
Specific Gravity, Urine: 1.006 (ref 1.005–1.030)
pH: 6 (ref 5.0–8.0)

## 2023-08-10 LAB — CBC
HCT: 27.7 % — ABNORMAL LOW (ref 36.0–46.0)
Hemoglobin: 9 g/dL — ABNORMAL LOW (ref 12.0–15.0)
MCH: 31.9 pg (ref 26.0–34.0)
MCHC: 32.5 g/dL (ref 30.0–36.0)
MCV: 98.2 fL (ref 80.0–100.0)
Platelets: 281 10*3/uL (ref 150–400)
RBC: 2.82 MIL/uL — ABNORMAL LOW (ref 3.87–5.11)
RDW: 12.3 % (ref 11.5–15.5)
WBC: 5.4 10*3/uL (ref 4.0–10.5)
nRBC: 0 % (ref 0.0–0.2)

## 2023-08-10 LAB — COMPREHENSIVE METABOLIC PANEL
ALT: 10 U/L (ref 0–44)
AST: 15 U/L (ref 15–41)
Albumin: 3.2 g/dL — ABNORMAL LOW (ref 3.5–5.0)
Alkaline Phosphatase: 44 U/L (ref 38–126)
Anion gap: 8 (ref 5–15)
BUN: 7 mg/dL (ref 6–20)
CO2: 24 mmol/L (ref 22–32)
Calcium: 8.5 mg/dL — ABNORMAL LOW (ref 8.9–10.3)
Chloride: 109 mmol/L (ref 98–111)
Creatinine, Ser: 0.67 mg/dL (ref 0.44–1.00)
GFR, Estimated: >60 mL/min (ref >60)
Glucose, Bld: 86 mg/dL (ref 70–99)
Potassium: 4 mmol/L (ref 3.5–5.1)
Sodium: 141 mmol/L (ref 135–145)
Total Bilirubin: 0.2 mg/dL (ref 0.0–1.2)
Total Protein: 5.8 g/dL — ABNORMAL LOW (ref 6.5–8.1)

## 2023-08-10 LAB — HCG, QUANTITATIVE, PREGNANCY: hCG, Beta Chain, Quant, S: 1578 m[IU]/mL — ABNORMAL HIGH (ref <5)

## 2023-08-10 MED ORDER — MISOPROSTOL 200 MCG PO TABS
800.0000 ug | ORAL_TABLET | Freq: Once | ORAL | Status: AC
  Administered 2023-08-10: 800 ug via BUCCAL
  Filled 2023-08-10: qty 4

## 2023-08-10 MED ORDER — LACTATED RINGERS IV BOLUS
1000.0000 mL | Freq: Once | INTRAVENOUS | Status: AC
  Administered 2023-08-10: 1000 mL via INTRAVENOUS

## 2023-08-10 MED ORDER — OXYCODONE HCL 5 MG PO TABS: 5.0000 mg | ORAL_TABLET | ORAL | 0 refills | Status: AC | PRN

## 2023-08-10 MED ORDER — ONDANSETRON 8 MG PO TBDP: 8.0000 mg | ORAL_TABLET | Freq: Three times a day (TID) | ORAL | 0 refills | Status: AC | PRN

## 2023-08-10 MED ORDER — FERROUS SULFATE 325 (65 FE) MG PO TABS: 325.0000 mg | ORAL_TABLET | Freq: Every day | ORAL | 0 refills | Status: DC

## 2023-08-10 MED ORDER — MISOPROSTOL 200 MCG PO TABS: 400.0000 ug | ORAL_TABLET | Freq: Three times a day (TID) | ORAL | 0 refills | Status: DC

## 2023-08-10 MED ORDER — OXYCODONE HCL 5 MG PO TABS
10.0000 mg | ORAL_TABLET | Freq: Once | ORAL | Status: AC
  Administered 2023-08-10: 10 mg via ORAL
  Filled 2023-08-10: qty 2

## 2023-08-10 NOTE — Discharge Instructions (Addendum)
 [facility] [employer] for [employer] at [employer] for [facility]    Phone: [phone] ( Call for an appointment in 7-10 days)  Center for [employer] at [employer]: [phone]  Center for [employer] at [place]  Phone: [phone]  Center for [employer] at [employer]  Phone: [phone]  Center for [employer] at [place]  Phone: [phone]  [facility] at [facility]   Phone: [phone]  [facility]       Phone: [phone]  [facility]    Phone: [phone]   [facility]    Phone: [phone]  [facility]    Phone: [phone]  [facility]    Phone: [phone]  [facility]       Phone: [phone]   [facility]  Phone: [phone]  [facility]    Phone: [phone]  [facility]   Phone: [phone]  [facility]      Phone: [phone]  [facility]    Phone: [phone]    Please call our office for a follow up in 7-10  days

## 2023-08-10 NOTE — MAU Provider Note (Signed)
 Chief Complaint:  Vaginal Bleeding, Dizziness, and Abdominal Pain   HPI   Event Date/Time   First Provider Initiated Contact with Patient 08/10/23 1441      Kathryn Randall is a 33 y.o. G2P1001 at Unknown who presents to maternity admissions  with her SO who is contributing to the HPI. Patient reporting she was seen in our ED on 2/26 and was told she was likely having a spontaneous miscarriage and was discharged home. Patient reports that she had a f/u hcg level at an urgent care and was told that the #'s have increased instead of decreased and should have immediate follow up.  She is currently reporting lower abdominal pain 6/10 and she reports that she had heavy Vaginal bleeding on 2/27 " I was changing pads 3 x/hour" on 2/28 she had moderate bleeding and now she reports bleeding is lighter. She feels very faint and fatigued and denies current fever or chills.  Records reviewed from 08/06/23 ED visit 2/26 ultrasound showed a gestational sac IUP with no YS, or Embryo c/w a likely failed pregnancy    Past Medical History:  Diagnosis Date   Allergic state 06/28/2011   Anemia 11/14/2011   Anxiety    Anxiety and depression 07/17/2011   Back pain    Cervical cancer screening 05/26/2012   Dental infection 02/04/2016   Depression    Pt. States "disorder is being called bipolar"   Dysmenorrhea 05/26/2012   Hypokalemia 11/14/2011   Insomnia 02/21/2013   Migraine 06/28/2011   Neck strain 06/28/2012   Overweight(278.02) 04/27/2012   Preventative health care 06/28/2011   Tachycardia 11/14/2011   OB History  Gravida Para Term Preterm AB Living  2 1 1  0 0 1  SAB IAB Ectopic Multiple Live Births  0 0 0 0 1    # Outcome Date GA Lbr Len/2nd Weight Sex Type Anes PTL Lv  2 Current           1 Term 04/04/18 [redacted]w[redacted]d  2705 g M Vag-Spont EPI  LIV   Past Surgical History:  Procedure Laterality Date   WISDOM TOOTH EXTRACTION  33 yrs old   Family History  Problem Relation Age of Onset   Migraines Mother     Arthritis Mother    Hearing loss Mother    Menstrual problems Mother    Migraines Father    Obesity Maternal Grandmother    Heart disease Maternal Grandmother        CHF   Hypertension Maternal Grandmother    Hearing loss Maternal Grandmother    Arthritis Maternal Grandmother    Menstrual problems Maternal Grandmother    Diabetes Maternal Grandfather        type 2   Obesity Maternal Grandfather    Leukemia Paternal Grandfather    Migraines Paternal Grandfather    Cancer Paternal Grandfather        leukemia   Ovarian cysts Paternal Aunt    Social History   Tobacco Use   Smoking status: Never   Smokeless tobacco: Never   Tobacco comments:    black and milds every once in awhile  Vaping Use   Vaping status: Former  Substance Use Topics   Alcohol use: No   Drug use: Not Currently    Types: Marijuana   Allergies  Allergen Reactions   Ibuprofen     Racing heart- when used in large doses    Morphine And Codeine Hives and Itching   Medications Prior to Admission  Medication  Sig Dispense Refill Last Dose/Taking   acetaminophen (TYLENOL) 500 MG tablet Take 500 mg by mouth every 4 (four) hours as needed for mild pain or headache.   08/10/2023   ARIPiprazole (ABILIFY) 5 MG tablet Take 5 mg by mouth daily.   08/10/2023   Prenatal Vit-Fe Phos-FA-Omega (VITAFOL GUMMIES) 3.33-0.333-34.8 MG CHEW Chew 3 tablets by mouth daily before breakfast. 90 tablet 11 08/09/2023   venlafaxine XR (EFFEXOR-XR) 75 MG 24 hr capsule TAKE THREE CAPSULES BY MOUTH EVERY MORNING WITH BREAKFAST 90 capsule 11 08/10/2023   dibucaine (NUPERCAINAL) 1 % OINT Place 1 application rectally as needed for hemorrhoids. (Patient not taking: Reported on 04/20/2018) 28 g 0    Drospirenone (SLYND) 4 MG TABS Take 1 tablet by mouth daily before breakfast. 28 tablet 11    ibuprofen (ADVIL) 800 MG tablet Take 1 tablet (800 mg total) by mouth every 8 (eight) hours as needed. 30 tablet 5    omeprazole (PRILOSEC) 20 MG capsule Take 1  capsule (20 mg total) by mouth 2 (two) times daily before a meal. (Patient not taking: Reported on 06/25/2018) 60 capsule 5 Not Taking   witch hazel-glycerin (TUCKS) pad Apply 1 application topically as needed for hemorrhoids. (Patient not taking: Reported on 04/20/2018) 40 each 12     I have reviewed patient's Past Medical Hx, Surgical Hx, Family Hx, Social Hx, medications and allergies.   ROS  Pertinent items noted in HPI and remainder of comprehensive ROS otherwise negative.   PHYSICAL EXAM  Patient Vitals for the past 24 hrs:  BP Temp Temp src Pulse Resp SpO2 Weight  08/10/23 1619 107/66 -- -- 85 -- -- --  08/10/23 1440 -- -- -- -- -- 100 % --  08/10/23 1437 102/63 -- -- 97 -- -- --  08/10/23 1415 108/65 98 F (36.7 C) Oral (!) 126 15 100 % 73.5 kg    Recent Results (from the past 2160 hours)  Urinalysis, w/ Reflex to Culture (Infection Suspected) -Urine, Clean Catch     Status: Abnormal   Collection Time: 08/06/23 11:11 AM  Result Value Ref Range   Specimen Source URINE, CLEAN CATCH    Color, Urine YELLOW YELLOW   APPearance HAZY (A) CLEAR   Specific Gravity, Urine 1.020 1.005 - 1.030   pH 7.0 5.0 - 8.0   Glucose, UA NEGATIVE NEGATIVE mg/dL   Hgb urine dipstick LARGE (A) NEGATIVE   Bilirubin Urine NEGATIVE NEGATIVE   Ketones, ur NEGATIVE NEGATIVE mg/dL   Protein, ur 30 (A) NEGATIVE mg/dL   Nitrite NEGATIVE NEGATIVE   Leukocytes,Ua TRACE (A) NEGATIVE   RBC / HPF >50 0 - 5 RBC/hpf   WBC, UA 6-10 0 - 5 WBC/hpf    Comment:        Reflex urine culture not performed if WBC <=10, OR if Squamous epithelial cells >5. If Squamous epithelial cells >5 suggest recollection.    Bacteria, UA RARE (A) NONE SEEN   Squamous Epithelial / HPF 0-5 0 - 5 /HPF   Mucus PRESENT     Comment: Performed at [facility], [address] [place], [place] [address]  CBC with Differential     Status: None   Collection Time: 08/06/23 11:12 AM  Result Value Ref Range   WBC 5.9 4.0 - 10.5  K/uL   RBC 3.87 3.87 - 5.11 MIL/uL   Hemoglobin 12.3 12.0 - 15.0 g/dL   HCT 60.4 54.0 - 98.1 %   MCV 94.6 80.0 - 100.0 fL  MCH 31.8 26.0 - 34.0 pg   MCHC 33.6 30.0 - 36.0 g/dL   RDW 19.1 47.8 - 29.5 %   Platelets 303 150 - 400 K/uL   nRBC 0.0 0.0 - 0.2 %   Neutrophils Relative % 58 %   Neutro Abs 3.4 1.7 - 7.7 K/uL   Lymphocytes Relative 33 %   Lymphs Abs 2.0 0.7 - 4.0 K/uL   Monocytes Relative 7 %   Monocytes Absolute 0.4 0.1 - 1.0 K/uL   Eosinophils Relative 1 %   Eosinophils Absolute 0.0 0.0 - 0.5 K/uL   Basophils Relative 1 %   Basophils Absolute 0.1 0.0 - 0.1 K/uL   Immature Granulocytes 0 %   Abs Immature Granulocytes 0.02 0.00 - 0.07 K/uL    Comment: Performed at [facility], [address], [place], [place] [address]  hCG, quantitative, pregnancy     Status: Abnormal   Collection Time: 08/06/23 11:12 AM  Result Value Ref Range   hCG, Beta Chain, Quant, S 13,980 (H) <5 mIU/mL    Comment:          GEST. AGE      CONC.  (mIU/mL)   <=1 WEEK        5 - 50     2 WEEKS       50 - 500     3 WEEKS       100 - 10,000     4 WEEKS     1,000 - 30,000     5 WEEKS     3,500 - 115,000   6-8 WEEKS     12,000 - 270,000    12 WEEKS     15,000 - 220,000        FEMALE AND NON-PREGNANT FEMALE:     LESS THAN 5 mIU/mL Performed at [facility], [address], [place], [place] [address]   Comprehensive metabolic panel     Status: Abnormal   Collection Time: 08/06/23 11:12 AM  Result Value Ref Range   Sodium 136 135 - 145 mmol/L   Potassium 3.7 3.5 - 5.1 mmol/L   Chloride 107 98 - 111 mmol/L   CO2 22 22 - 32 mmol/L   Glucose, Bld 88 70 - 99 mg/dL    Comment: Glucose reference range applies only to samples taken after fasting for at least 8 hours.   BUN 7 6 - 20 mg/dL   Creatinine, Ser 4.69 0.44 - 1.00 mg/dL   Calcium 8.5 (L) 8.9 - 10.3 mg/dL   Total Protein 6.4 (L) 6.5 - 8.1 g/dL   Albumin 3.5 3.5 - 5.0 g/dL   AST 16 15 - 41 U/L   ALT 11 0 - 44 U/L   Alkaline  Phosphatase 42 38 - 126 U/L   Total Bilirubin 0.2 0.0 - 1.2 mg/dL   GFR, Estimated >62 >95 mL/min    Comment: (NOTE) Calculated using the CKD-EPI Creatinine Equation (2021)    Anion gap 7 5 - 15    Comment: Performed at [facility], [address] [place], [place] [address]  Type and screen [facility]     Status: None   Collection Time: 08/06/23 11:12 AM  Result Value Ref Range   ABO/RH(D) B POS    Antibody Screen NEG    Sample Expiration      08/09/2023,2359 Performed at [facility], [address], [place], [place] [address]   Urinalysis, Routine  w reflex microscopic -Urine, Clean Catch     Status: Abnormal   Collection Time: 08/10/23  2:33 PM  Result Value Ref Range   Color, Urine YELLOW YELLOW   APPearance HAZY (A) CLEAR   Specific Gravity, Urine 1.006 1.005 - 1.030   pH 6.0 5.0 - 8.0   Glucose, UA NEGATIVE NEGATIVE mg/dL   Hgb urine dipstick LARGE (A) NEGATIVE   Bilirubin Urine NEGATIVE NEGATIVE   Ketones, ur NEGATIVE NEGATIVE mg/dL   Protein, ur NEGATIVE NEGATIVE mg/dL   Nitrite NEGATIVE NEGATIVE   Leukocytes,Ua SMALL (A) NEGATIVE   RBC / HPF 0-5 0 - 5 RBC/hpf   WBC, UA 11-20 0 - 5 WBC/hpf   Bacteria, UA RARE (A) NONE SEEN   Squamous Epithelial / HPF 0-5 0 - 5 /HPF   Mucus PRESENT     Comment: Performed at [facility], [address], [place], [place] [address]  CBC     Status: Abnormal   Collection Time: 08/10/23  2:54 PM  Result Value Ref Range   WBC 5.4 4.0 - 10.5 K/uL   RBC 2.82 (L) 3.87 - 5.11 MIL/uL   Hemoglobin 9.0 (L) 12.0 - 15.0 g/dL   HCT 60.4 (L) 54.0 - 98.1 %   MCV 98.2 80.0 - 100.0 fL   MCH 31.9 26.0 - 34.0 pg   MCHC 32.5 30.0 - 36.0 g/dL   RDW 19.1 47.8 - 29.5 %   Platelets 281 150 - 400 K/uL   nRBC 0.0 0.0 - 0.2 %    Comment: Performed at [facility], [address] [place], [place] [address]  Comprehensive metabolic panel     Status: Abnormal   Collection Time: 08/10/23  2:54 PM  Result Value  Ref Range   Sodium 141 135 - 145 mmol/L   Potassium 4.0 3.5 - 5.1 mmol/L   Chloride 109 98 - 111 mmol/L   CO2 24 22 - 32 mmol/L   Glucose, Bld 86 70 - 99 mg/dL    Comment: Glucose reference range applies only to samples taken after fasting for at least 8 hours.   BUN 7 6 - 20 mg/dL   Creatinine, Ser 8.65 0.44 - 1.00 mg/dL   Calcium 8.5 (L) 8.9 - 10.3 mg/dL   Total Protein 5.8 (L) 6.5 - 8.1 g/dL   Albumin 3.2 (L) 3.5 - 5.0 g/dL   AST 15 15 - 41 U/L   ALT 10 0 - 44 U/L   Alkaline Phosphatase 44 38 - 126 U/L   Total Bilirubin 0.2 0.0 - 1.2 mg/dL   GFR, Estimated >78 >46 mL/min    Comment: (NOTE) Calculated using the CKD-EPI Creatinine Equation (2021)    Anion gap 8 5 - 15    Comment: Performed at [facility], [address], [place], [place] [address]  hCG, quantitative, pregnancy     Status: Abnormal   Collection Time: 08/10/23  2:54 PM  Result Value Ref Range   hCG, Beta Chain, Quant, S 1,578 (H) <5 mIU/mL    Comment:          GEST. AGE      CONC.  (mIU/mL)   <=1 WEEK        5 - 50     2 WEEKS       50 - 500     3 WEEKS       100 - 10,000     4 WEEKS     1,000 -  30,000     5 WEEKS     3,500 - 115,000   6-8 WEEKS     12,000 - 270,000    12 WEEKS     15,000 - 220,000        FEMALE AND NON-PREGNANT FEMALE:     LESS THAN 5 mIU/mL Performed at [facility], [address] [place], [place] [address]        Physical Exam Vitals and nursing note reviewed. Exam conducted with a chaperone present.  Constitutional:      General: She is not in acute distress.    Appearance: She is not ill-appearing.  HENT:     Head: Normocephalic.  Cardiovascular:     Rate and Rhythm: Tachycardia present.  Pulmonary:     Effort: Pulmonary effort is normal.     Breath sounds: Normal breath sounds.  Abdominal:     General: Abdomen is flat. There is no distension.     Palpations: Abdomen is soft.     Tenderness: There is abdominal tenderness. There is no right CVA tenderness,  left CVA tenderness, guarding or rebound. Negative signs include Murphy's sign.  Neurological:     Mental Status: She is oriented to person, place, and time.     Motor: Weakness present.  Psychiatric:        Attention and Perception: Attention normal.        Mood and Affect: Affect is flat.        Speech: Speech normal.        Behavior: Behavior normal.        Thought Content: Thought content normal.    Pelvic deferred per patient   Labs: Results for orders placed or performed during the hospital encounter of 08/10/23 (from the past 24 hours)  Urinalysis, Routine w reflex microscopic -Urine, Clean Catch     Status: Abnormal   Collection Time: 08/10/23  2:33 PM  Result Value Ref Range   Color, Urine YELLOW YELLOW   APPearance HAZY (A) CLEAR   Specific Gravity, Urine 1.006 1.005 - 1.030   pH 6.0 5.0 - 8.0   Glucose, UA NEGATIVE NEGATIVE mg/dL   Hgb urine dipstick LARGE (A) NEGATIVE   Bilirubin Urine NEGATIVE NEGATIVE   Ketones, ur NEGATIVE NEGATIVE mg/dL   Protein, ur NEGATIVE NEGATIVE mg/dL   Nitrite NEGATIVE NEGATIVE   Leukocytes,Ua SMALL (A) NEGATIVE   RBC / HPF 0-5 0 - 5 RBC/hpf   WBC, UA 11-20 0 - 5 WBC/hpf   Bacteria, UA RARE (A) NONE SEEN   Squamous Epithelial / HPF 0-5 0 - 5 /HPF   Mucus PRESENT   CBC     Status: Abnormal   Collection Time: 08/10/23  2:54 PM  Result Value Ref Range   WBC 5.4 4.0 - 10.5 K/uL   RBC 2.82 (L) 3.87 - 5.11 MIL/uL   Hemoglobin 9.0 (L) 12.0 - 15.0 g/dL   HCT 60.4 (L) 54.0 - 98.1 %   MCV 98.2 80.0 - 100.0 fL   MCH 31.9 26.0 - 34.0 pg   MCHC 32.5 30.0 - 36.0 g/dL   RDW 19.1 47.8 - 29.5 %   Platelets 281 150 - 400 K/uL   nRBC 0.0 0.0 - 0.2 %  Comprehensive metabolic panel     Status: Abnormal   Collection Time: 08/10/23  2:54 PM  Result Value Ref Range   Sodium 141 135 - 145 mmol/L   Potassium 4.0 3.5 - 5.1 mmol/L   Chloride 109  98 - 111 mmol/L   CO2 24 22 - 32 mmol/L   Glucose, Bld 86 70 - 99 mg/dL   BUN 7 6 - 20 mg/dL    Creatinine, Ser 1.61 0.44 - 1.00 mg/dL   Calcium 8.5 (L) 8.9 - 10.3 mg/dL   Total Protein 5.8 (L) 6.5 - 8.1 g/dL   Albumin 3.2 (L) 3.5 - 5.0 g/dL   AST 15 15 - 41 U/L   ALT 10 0 - 44 U/L   Alkaline Phosphatase 44 38 - 126 U/L   Total Bilirubin 0.2 0.0 - 1.2 mg/dL   GFR, Estimated >09 >60 mL/min   Anion gap 8 5 - 15  hCG, quantitative, pregnancy     Status: Abnormal   Collection Time: 08/10/23  2:54 PM  Result Value Ref Range   hCG, Beta Chain, Quant, S 1,578 (H) <5 mIU/mL    Imaging:  US OB LESS THAN 14 WEEKS WITH OB TRANSVAGINAL Result Date: 08/10/2023 CLINICAL DATA:  Vaginal bleeding for 6 days, fell pregnancy by previous ultrasound EXAM: OBSTETRIC <14 WK [place] AND TRANSVAGINAL OB US TECHNIQUE: Both transabdominal and transvaginal ultrasound examinations were performed for complete evaluation of the gestation as well as the maternal uterus, adnexal regions, and pelvic cul-de-sac. Transvaginal technique was performed to assess early pregnancy. COMPARISON:  08/06/2023 FINDINGS: Intrauterine gestational sac: None Yolk sac:  Not Visualized. Embryo:  Not Visualized. Maternal uterus/adnexae: The gestational sac seen previously is no longer identified. There is heterogeneous thickened endometrium measuring up to 21 mm, with small cystic areas identified, likely retained products of conception. The right ovary measures 3.1 x 2.3 x 2.5 cm and the left ovary measures 2.2 x 2.1 x 2.0 cm. Likely residual corpus luteal cyst within the left ovary. IMPRESSION: 1. Resolution of the gestational sac seen previously, now with residual heterogeneous echogenic material within the endometrial cavity consistent with retained products of conception after failed pregnancy. 2. Left corpus luteal cyst again noted. Electronically Signed   By: Sharlet Salina M.D.   On: 08/10/2023 16:46    MDM & MAU COURSE  MDM:  HIGH  CBC/CMP/HCG IVLR Pain management Imaging  Imaging ordered  and results were discussed with DR Despina Hidden  who personally reviewed the images @ 1750  Recommendations are for Cytotec 800 mg x 1 dose, followed by 400 mg TID x 3 days to completes Spontaneous abortion per [facility] Attending Dr Despina Hidden. I discussed  S/R/B with patient and she is agreeable to plan as described below  with strict precautions  2/26 hcg = 13,980 2/28 hcg = 2,704 3/2 hcg = 1578 08/08/23 patient went  to urgent care her hcg level was 2,704.00 per report from the Nurse on Staff . Patient called to clarify her result on speaker  while I was in the room and I heard and confirmed the hcg # reported to the patient   I have reviewed the patient chart and performed the physical exam . I have discussed the plan with Dr Despina Hidden [facility] Attending) Medications ordered as stated below.  A/P as described below.  Counseling and education provided and patient agreeable  with plan as described below. Verbalized understanding.    MAU Course: Orders Placed This Encounter  Procedures   US OB LESS THAN 14 WEEKS WITH OB TRANSVAGINAL   Urinalysis, Routine w reflex microscopic -Urine, Clean Catch   CBC   Comprehensive metabolic panel   hCG, quantitative, pregnancy   Discharge patient Discharge disposition: 01-Home or Self Care; Discharge patient  date: 08/10/2023   Discharge patient Discharge disposition: 01-Home or Self Care; Discharge patient date: 08/10/2023 After Initial Cytotec dose administered and per Nursing protocol   Meds ordered this encounter  Medications   oxyCODONE (Oxy IR/ROXICODONE) immediate release tablet 10 mg    Refill:  0   lactated ringers bolus 1,000 mL   misoprostol (CYTOTEC) tablet 800 mcg   misoprostol (CYTOTEC) 200 MCG tablet    Sig: Take 2 tablets (400 mcg total) by mouth in the morning, at noon, and at bedtime for 3 days. Please place inside cheek Buccal    Dispense:  18 tablet    Refill:  0    Supervising Provider:   Reva Bores [[id number]]   oxyCODONE (ROXICODONE) 5 MG immediate release tablet    Sig: Take 1 tablet (5 mg total)  by mouth every 4 (four) hours as needed for up to 3 days for severe pain (pain score 7-10).    Dispense:  18 tablet    Refill:  0    Supervising Provider:   Reva Bores [[id number]]   ferrous sulfate 325 (65 FE) MG tablet    Sig: Take 1 tablet (325 mg total) by mouth daily.    Dispense:  30 tablet    Refill:  0    Supervising Provider:   Reva Bores [[id number]]   ondansetron (ZOFRAN-ODT) 8 MG disintegrating tablet    Sig: Take 1 tablet (8 mg total) by mouth every 8 (eight) hours as needed for nausea or vomiting.    Dispense:  20 tablet    Refill:  0    Supervising Provider:   Reva Bores [[id number]]    ASSESSMENT   1. Missed abortion   2. Vaginal bleeding   3. Iron deficiency anemia due to chronic blood loss     PLAN  Discharge home in stable condition with return precautions. She verbalized understanding and is agreeable to the plan as described below.  Strict bleeding precautions reviewed with patient Nothing inside the vagina until follow up appointment Pelvic rest  Patient encouraged to call for an appointment as well  Message sent to [facility] for an appointment follow up in 7 -10 days     Follow-up Information     [facility] for [facility] Follow up in 1 week(s).   Specialty: Obstetrics and Gynecology Why: Miscarriage follow-up Contact information: [address] [place] [id number] [phone]                Marcell Barlow, MSN, [facility], Center for [employer]

## 2023-08-10 NOTE — MAU Note (Signed)
.  Kathryn Randall is a 33 y.o. at Unknown here in MAU reporting: vag bleeding since Tuesday, pt states she was told she was likely having a miscarriage.  Pt states bleeding was the worst on Thursday, bleeding is now light to moderate. Reports she is starting to feel weak and dizzy.  Pt reports she has gone between 2 different facilities for lab draws.  States she was told to come to [facility] because her quant increased instead of decreased.     Onset of complaint: tuesday Pain score: 5 Vitals:   08/10/23 1415  BP: 108/65  Pulse: (!) 126  Resp: 15  Temp: 98 F (36.7 C)  SpO2: 100%   Lab orders placed from triage: ua

## 2023-08-14 ENCOUNTER — Encounter: Payer: Self-pay | Admitting: Obstetrics & Gynecology

## 2023-08-14 ENCOUNTER — Other Ambulatory Visit: Payer: Self-pay

## 2023-08-14 ENCOUNTER — Ambulatory Visit: Admitting: Obstetrics & Gynecology

## 2023-08-14 VITALS — BP 112/72 | HR 98 | Wt 171.0 lb

## 2023-08-14 DIAGNOSIS — Z3A08 8 weeks gestation of pregnancy: Secondary | ICD-10-CM | POA: Diagnosis not present

## 2023-08-14 DIAGNOSIS — O039 Complete or unspecified spontaneous abortion without complication: Secondary | ICD-10-CM

## 2023-08-14 NOTE — Progress Notes (Signed)
 Ultrasounds Results Note  SUBJECTIVE HPI:  [name] is a 33 y.o. G2P1011 at Unknown by LMP who presents to the [facility] for followup after miscarriage and treatment with cytotec. The patient denies abdominal pain or vaginal bleeding.  Ultrasound showed failed pregnancy.   Past Medical History:  Diagnosis Date   Allergic state 06/28/2011   Anemia 11/14/2011   Anxiety    Anxiety and depression 07/17/2011   Back pain    Cervical cancer screening 05/26/2012   Dental infection 02/04/2016   Depression    Pt. States "disorder is being called bipolar"   Dysmenorrhea 05/26/2012   Hypokalemia 11/14/2011   Insomnia 02/21/2013   Migraine 06/28/2011   Neck strain 06/28/2012   Overweight(278.02) 04/27/2012   Preventative health care 06/28/2011   Tachycardia 11/14/2011   Past Surgical History:  Procedure Laterality Date   WISDOM TOOTH EXTRACTION  33 yrs old   Social History   Socioeconomic History   Marital status: Significant Other    Spouse name: Not on file   Number of children: Not on file   Years of education: Not on file   Highest education level: Not on file  Occupational History   Not on file  Tobacco Use   Smoking status: Never   Smokeless tobacco: Never   Tobacco comments:    black and milds every once in awhile  Vaping Use   Vaping status: Former  Substance and Sexual Activity   Alcohol use: No   Drug use: Not Currently    Types: Marijuana   Sexual activity: Yes    Partners: Male    Birth control/protection: I.U.D.  Other Topics Concern   Not on file  Social History Narrative   Not on file   Social Drivers of Health   Financial Resource Strain: Medium Risk (04/03/2018)   Overall Financial Resource Strain (CARDIA)    Difficulty of Paying Living Expenses: Somewhat hard  Food Insecurity: No Food Insecurity (04/03/2018)   Hunger Vital Sign    Worried About Running Out of Food in the Last Year: Never true    Ran Out of Food in the Last Year:  Never true  Transportation Needs: No Transportation Needs (04/03/2018)   PRAPARE - Administrator, Civil Service (Medical): No    Lack of Transportation (Non-Medical): No  Physical Activity: Sufficiently Active (04/03/2018)   Exercise Vital Sign    Days of Exercise per Week: 3 days    Minutes of Exercise per Session: 60 min  Stress: Stress Concern Present (04/03/2018)   [employer] of Occupational Health - Occupational Stress Questionnaire    Feeling of Stress : Very much  Social Connections: Somewhat Isolated (04/03/2018)   Social Connection and Isolation Panel [NHANES]    Frequency of Communication with Friends and Family: More than three times a week    Frequency of Social Gatherings with Friends and Family: Twice a week    Attends Religious Services: Never    Database administrator or Organizations: No    Attends Banker Meetings: Never    Marital Status: Living with partner  Intimate Partner Violence: Not At Risk (04/03/2018)   Humiliation, Afraid, Rape, and Kick questionnaire    Fear of Current or Ex-Partner: No    Emotionally Abused: No    Physically Abused: No    Sexually Abused: No   Current Outpatient Medications on File Prior to Visit  Medication Sig Dispense Refill   acetaminophen (TYLENOL) 500 MG  tablet Take 500 mg by mouth every 4 (four) hours as needed for mild pain or headache.     ARIPiprazole (ABILIFY) 5 MG tablet Take 5 mg by mouth daily.     ferrous sulfate 325 (65 FE) MG tablet Take 1 tablet (325 mg total) by mouth daily. 30 tablet 0   ondansetron (ZOFRAN-ODT) 8 MG disintegrating tablet Take 1 tablet (8 mg total) by mouth every 8 (eight) hours as needed for nausea or vomiting. 20 tablet 0   venlafaxine XR (EFFEXOR-XR) 75 MG 24 hr capsule TAKE THREE CAPSULES BY MOUTH EVERY MORNING WITH BREAKFAST 90 capsule 11   dibucaine (NUPERCAINAL) 1 % OINT Place 1 application rectally as needed for hemorrhoids. (Patient not taking: Reported on  04/20/2018) 28 g 0   misoprostol (CYTOTEC) 200 MCG tablet Take 2 tablets (400 mcg total) by mouth in the morning, at noon, and at bedtime for 3 days. Please place inside cheek Buccal 18 tablet 0   omeprazole (PRILOSEC) 20 MG capsule Take 1 capsule (20 mg total) by mouth 2 (two) times daily before a meal. (Patient not taking: Reported on 06/25/2018) 60 capsule 5   witch hazel-glycerin (TUCKS) pad Apply 1 application topically as needed for hemorrhoids. (Patient not taking: Reported on 08/14/2023) 40 each 12   No current facility-administered medications on file prior to visit.   Allergies  Allergen Reactions   Ibuprofen     Racing heart- when used in large doses    Morphine And Codeine Hives and Itching    I have reviewed patient's Past Medical Hx, Surgical Hx, Family Hx, Social Hx, medications and allergies.   Review of Systems Review of Systems  Constitutional: Negative for fever and chills.  Gastrointestinal: Negative for nausea, vomiting, abdominal pain, diarrhea and constipation.  Genitourinary: Negative for dysuria.  Musculoskeletal: Negative for back pain.  Neurological: Negative for dizziness and weakness.    Physical Exam  BP 112/72   Pulse 98   Wt 171 lb (77.6 kg)   LMP 05/05/2023 (Exact Date)   Breastfeeding Unknown   BMI 29.35 kg/m   GENERAL: Well-developed, well-nourished female in no acute distress.  HEENT: Normocephalic, atraumatic.   LUNGS: Effort normal ABDOMEN: soft, non-tender HEART: Regular rate  SKIN: Warm, dry and without erythema PSYCH: Normal mood and affect NEURO: Alert and oriented x 4  LAB RESULTS No results found for this or any previous visit (from the past 24 hours).  IMAGING US OB LESS THAN 14 WEEKS WITH OB TRANSVAGINAL Result Date: 08/10/2023 CLINICAL DATA:  Vaginal bleeding for 6 days, fell pregnancy by previous ultrasound EXAM: OBSTETRIC <14 WK [place] AND TRANSVAGINAL OB US TECHNIQUE: Both transabdominal and transvaginal ultrasound examinations  were performed for complete evaluation of the gestation as well as the maternal uterus, adnexal regions, and pelvic cul-de-sac. Transvaginal technique was performed to assess early pregnancy. COMPARISON:  08/06/2023 FINDINGS: Intrauterine gestational sac: None Yolk sac:  Not Visualized. Embryo:  Not Visualized. Maternal uterus/adnexae: The gestational sac seen previously is no longer identified. There is heterogeneous thickened endometrium measuring up to 21 mm, with small cystic areas identified, likely retained products of conception. The right ovary measures 3.1 x 2.3 x 2.5 cm and the left ovary measures 2.2 x 2.1 x 2.0 cm. Likely residual corpus luteal cyst within the left ovary. IMPRESSION: 1. Resolution of the gestational sac seen previously, now with residual heterogeneous echogenic material within the endometrial cavity consistent with retained products of conception after failed pregnancy. 2. Left corpus luteal cyst again noted.  Electronically Signed   By: Sharlet Salina M.D.   On: 08/10/2023 16:46   US OB LESS THAN 14 WEEKS WITH OB TRANSVAGINAL Result Date: 08/06/2023 CLINICAL DATA:  Vaginal bleeding. Estimated gestational age [redacted] weeks, 4 days by LMP. EXAM: OBSTETRIC <14 WK [place] AND TRANSVAGINAL OB US TECHNIQUE: Both transabdominal and transvaginal ultrasound examinations were performed for complete evaluation of the gestation as well as the maternal uterus, adnexal regions, and pelvic cul-de-sac. Transvaginal technique was performed to assess early pregnancy. COMPARISON:  None Available. FINDINGS: Intrauterine gestational sac: Single. Yolk sac:  Not Visualized. Embryo:  Not Visualized. MSD: 32.1 mm   8 w   3 d Subchorionic hemorrhage:  None visualized. Maternal uterus/adnexae: Unremarkable.  Left ovarian corpus luteum. IMPRESSION: 1. Findings meet definitive criteria for failed pregnancy. This follows SRU consensus guidelines: Diagnostic Criteria for Nonviable Pregnancy Early in the First Trimester. Macy Mis J Med [phone]. Electronically Signed   By: Obie Dredge M.D.   On: 08/06/2023 15:27    ASSESSMENT 1. Spontaneous abortion in first trimester   S/p cytotec 4 days ago with minimal bleeding and pain currently suspect complete Ab  PLAN Discharge home in stable condition. F/U in 2 weeks and report if s/sx bleeding and pain increase  Adam Phenix, MD  08/14/2023  4:39 PM

## 2023-08-18 ENCOUNTER — Ambulatory Visit: Payer: BLUE CROSS/BLUE SHIELD

## 2023-08-28 ENCOUNTER — Other Ambulatory Visit: Payer: Self-pay

## 2023-08-28 ENCOUNTER — Ambulatory Visit (INDEPENDENT_AMBULATORY_CARE_PROVIDER_SITE_OTHER): Admitting: Obstetrics & Gynecology

## 2023-08-28 VITALS — BP 135/76 | HR 124 | Ht 64.0 in | Wt 156.0 lb

## 2023-08-28 DIAGNOSIS — O039 Complete or unspecified spontaneous abortion without complication: Secondary | ICD-10-CM | POA: Diagnosis not present

## 2023-08-28 DIAGNOSIS — Z3A08 8 weeks gestation of pregnancy: Secondary | ICD-10-CM | POA: Diagnosis not present

## 2023-08-28 NOTE — Progress Notes (Signed)
 SUBJECTIVE HPI:  [name] is a 33 y.o. G2P1011 at Unknown by LMP who presents to the [facility] for followup after miscarriage and treatment with cytotec. The patient denies abdominal pain or vaginal bleeding.  Ultrasound showed failed pregnancy.       Past Medical History:  Diagnosis Date   Allergic state 06/28/2011   Anemia 11/14/2011   Anxiety     Anxiety and depression 07/17/2011   Back pain     Cervical cancer screening 05/26/2012   Dental infection 02/04/2016   Depression      Pt. States "disorder is being called bipolar"   Dysmenorrhea 05/26/2012   Hypokalemia 11/14/2011   Insomnia 02/21/2013   Migraine 06/28/2011   Neck strain 06/28/2012   Overweight(278.02) 04/27/2012   Preventative health care 06/28/2011   Tachycardia 11/14/2011             Past Surgical History:  Procedure Laterality Date   WISDOM TOOTH EXTRACTION   33 yrs old        Social History         Socioeconomic History   Marital status: Significant Other      Spouse name: Not on file   Number of children: Not on file   Years of education: Not on file   Highest education level: Not on file  Occupational History   Not on file  Tobacco Use   Smoking status: Never   Smokeless tobacco: Never   Tobacco comments:      black and milds every once in awhile  Vaping Use   Vaping status: Former  Substance and Sexual Activity   Alcohol use: No   Drug use: Not Currently      Types: Marijuana   Sexual activity: Yes      Partners: Male      Birth control/protection: I.U.D.  Other Topics Concern   Not on file  Social History Narrative   Not on file    Social Drivers of Health        Financial Resource Strain: Medium Risk (04/03/2018)    Overall Financial Resource Strain (CARDIA)     Difficulty of Paying Living Expenses: Somewhat hard  Food Insecurity: No Food Insecurity (04/03/2018)    Hunger Vital Sign     Worried About Running Out of Food in the Last Year: Never true     Ran  Out of Food in the Last Year: Never true  Transportation Needs: No Transportation Needs (04/03/2018)    PRAPARE - Therapist, art (Medical): No     Lack of Transportation (Non-Medical): No  Physical Activity: Sufficiently Active (04/03/2018)    Exercise Vital Sign     Days of Exercise per Week: 3 days     Minutes of Exercise per Session: 60 min  Stress: Stress Concern Present (04/03/2018)    [employer] of Occupational Health - Occupational Stress Questionnaire     Feeling of Stress : Very much  Social Connections: Somewhat Isolated (04/03/2018)    Social Connection and Isolation Panel [NHANES]     Frequency of Communication with Friends and Family: More than three times a week     Frequency of Social Gatherings with Friends and Family: Twice a week     Attends Religious Services: Never     Database administrator or Organizations: No     Attends Banker Meetings: Never     Marital Status: Living with partner  Intimate  Partner Violence: Not At Risk (04/03/2018)    Humiliation, Afraid, Rape, and Kick questionnaire     Fear of Current or Ex-Partner: No     Emotionally Abused: No     Physically Abused: No     Sexually Abused: No    Medications Ordered Prior to Encounter        Current Outpatient Medications on File Prior to Visit  Medication Sig Dispense Refill   acetaminophen (TYLENOL) 500 MG tablet Take 500 mg by mouth every 4 (four) hours as needed for mild pain or headache.       ARIPiprazole (ABILIFY) 5 MG tablet Take 5 mg by mouth daily.       ferrous sulfate 325 (65 FE) MG tablet Take 1 tablet (325 mg total) by mouth daily. 30 tablet 0   ondansetron (ZOFRAN-ODT) 8 MG disintegrating tablet Take 1 tablet (8 mg total) by mouth every 8 (eight) hours as needed for nausea or vomiting. 20 tablet 0   venlafaxine XR (EFFEXOR-XR) 75 MG 24 hr capsule TAKE THREE CAPSULES BY MOUTH EVERY MORNING WITH BREAKFAST 90 capsule 11   dibucaine  (NUPERCAINAL) 1 % OINT Place 1 application rectally as needed for hemorrhoids. (Patient not taking: Reported on 04/20/2018) 28 g 0   misoprostol (CYTOTEC) 200 MCG tablet Take 2 tablets (400 mcg total) by mouth in the morning, at noon, and at bedtime for 3 days. Please place inside cheek Buccal 18 tablet 0   omeprazole (PRILOSEC) 20 MG capsule Take 1 capsule (20 mg total) by mouth 2 (two) times daily before a meal. (Patient not taking: Reported on 06/25/2018) 60 capsule 5   witch hazel-glycerin (TUCKS) pad Apply 1 application topically as needed for hemorrhoids. (Patient not taking: Reported on 08/14/2023) 40 each 12    No current facility-administered medications on file prior to visit.      Allergies       Allergies  Allergen Reactions   Ibuprofen        Racing heart- when used in large doses     Morphine And Codeine Hives and Itching        I have reviewed patient's Past Medical Hx, Surgical Hx, Family Hx, Social Hx, medications and allergies.        Physical Exam  Blood pressure 135/76, pulse (!) 124, height 5\' 4"  (1.626 m), weight 156 lb (70.8 kg), last menstrual period 05/05/2023, SpO2 97%, not currently breastfeeding.  GENERAL: Well-developed, well-nourished female in no acute distress.  HEENT: Normocephalic, atraumatic.   LUNGS: Effort normal ABDOMEN: soft, non-tender HEART: Regular rate  SKIN: Warm, dry and without erythema PSYCH: Normal mood and affect NEURO: Alert and oriented x 4   LAB RESULTS Lab Results Last 24 Hours  No results found for this or any previous visit (from the past 24 hours).     IMAGING  Imaging Results  US OB LESS THAN 14 WEEKS WITH OB TRANSVAGINAL Result Date: 08/10/2023 CLINICAL DATA:  Vaginal bleeding for 6 days, fell pregnancy by previous ultrasound EXAM: OBSTETRIC <14 WK [place] AND TRANSVAGINAL OB US TECHNIQUE: Both transabdominal and transvaginal ultrasound examinations were performed for complete evaluation of the gestation as well as the  maternal uterus, adnexal regions, and pelvic cul-de-sac. Transvaginal technique was performed to assess early pregnancy. COMPARISON:  08/06/2023 FINDINGS: Intrauterine gestational sac: None Yolk sac:  Not Visualized. Embryo:  Not Visualized. Maternal uterus/adnexae: The gestational sac seen previously is no longer identified. There is heterogeneous thickened endometrium measuring up to 21 mm, with small  cystic areas identified, likely retained products of conception. The right ovary measures 3.1 x 2.3 x 2.5 cm and the left ovary measures 2.2 x 2.1 x 2.0 cm. Likely residual corpus luteal cyst within the left ovary. IMPRESSION: 1. Resolution of the gestational sac seen previously, now with residual heterogeneous echogenic material within the endometrial cavity consistent with retained products of conception after failed pregnancy. 2. Left corpus luteal cyst again noted. Electronically Signed   By: Sharlet Salina M.D.   On: 08/10/2023 16:46    US OB LESS THAN 14 WEEKS WITH OB TRANSVAGINAL Result Date: 08/06/2023 CLINICAL DATA:  Vaginal bleeding. Estimated gestational age [redacted] weeks, 4 days by LMP. EXAM: OBSTETRIC <14 WK [place] AND TRANSVAGINAL OB US TECHNIQUE: Both transabdominal and transvaginal ultrasound examinations were performed for complete evaluation of the gestation as well as the maternal uterus, adnexal regions, and pelvic cul-de-sac. Transvaginal technique was performed to assess early pregnancy. COMPARISON:  None Available. FINDINGS: Intrauterine gestational sac: Single. Yolk sac:  Not Visualized. Embryo:  Not Visualized. MSD: 32.1 mm   8 w   3 d Subchorionic hemorrhage:  None visualized. Maternal uterus/adnexae: Unremarkable.  Left ovarian corpus luteum. IMPRESSION: 1. Findings meet definitive criteria for failed pregnancy. This follows SRU consensus guidelines: Diagnostic Criteria for Nonviable Pregnancy Early in the First Trimester. [facility]acy Mis J Med [phone]. Electronically Signed   By: Obie Dredge  M.D.   On: 08/06/2023 15:27       ASSESSMENT 1. Spontaneous abortion in first trimester   S/p cytotec no pain or bleeding now   PLAN Discharge home in stable condition. Orders Placed This Encounter  Procedures   Beta hCG quant (ref lab)   F/u on HCG result   Adam Phenix, MD 08/28/2023

## 2023-08-29 ENCOUNTER — Encounter: Payer: Self-pay | Admitting: Obstetrics & Gynecology

## 2023-08-29 LAB — BETA HCG QUANT (REF LAB): hCG Quant: 10 m[IU]/mL

## 2023-09-02 ENCOUNTER — Encounter: Payer: Self-pay | Admitting: Obstetrics & Gynecology

## 2024-01-20 ENCOUNTER — Other Ambulatory Visit (HOSPITAL_COMMUNITY)
Admission: RE | Admit: 2024-01-20 | Discharge: 2024-01-20 | Disposition: A | Discharge status: Home / Self Care | Source: Ambulatory Visit | Attending: Obstetrics and Gynecology | Admitting: Obstetrics and Gynecology

## 2024-01-20 ENCOUNTER — Ambulatory Visit (INDEPENDENT_AMBULATORY_CARE_PROVIDER_SITE_OTHER)

## 2024-01-20 ENCOUNTER — Other Ambulatory Visit: Payer: Self-pay

## 2024-01-20 VITALS — BP 127/85 | HR 103 | Wt 173.0 lb

## 2024-01-20 DIAGNOSIS — Z3480 Encounter for supervision of other normal pregnancy, unspecified trimester: Secondary | ICD-10-CM

## 2024-01-20 LAB — POCT URINE PREGNANCY: Preg Test, Ur: POSITIVE — AB

## 2024-01-20 NOTE — Progress Notes (Signed)
 New OB Intake  I explained I am completing New OB Intake today. We discussed EDD of 09/13/2024, by Last Menstrual Period. Pt is G3P1011. I reviewed her allergies, medications and Medical/Surgical/OB history.    Patient Active Problem List   Diagnosis Date Noted   Encounter for supervision of normal pregnancy in multigravida 01/20/2024   SVD (spontaneous vaginal delivery) 04/03/2018   Supervision of normal first pregnancy, antepartum 10/08/2017   Dental infection 02/04/2016   Post concussive syndrome 12/13/2014   Insomnia 02/21/2013   Cerumen impaction 06/28/2012   Neck strain 06/28/2012   Cervical cancer screening 05/26/2012   Overweight 04/27/2012   Tachycardia 11/14/2011   Anemia 11/14/2011   Hypokalemia 11/14/2011   Anxiety and depression 07/17/2011   Migraine 06/28/2011   Allergy 06/28/2011   Back pain     Concerns addressed today  Patient informed that the ultrasound is considered a limited obstetric ultrasound and is not intended to be a complete ultrasound exam.  Patient also informed that the ultrasound is not being completed with the intent of assessing for fetal or placental anomalies or any pelvic abnormalities. Explained that the purpose of today's ultrasound is to assess for viability.  Patient acknowledges the purpose of the exam and the limitations of the study.     Delivery Plans Plans to deliver at [facility]. Discussed the nature of our practice with multiple providers including residents and students. Due to the size of the practice, the delivering provider may not be the same as those providing prenatal care.   MyChart/Babyscripts MyChart access verified. I explained pt will have some visits in office and some virtually. Babyscripts app discussed and ordered.   Blood Pressure Cuff Blood pressure cuff discussedDiscussed to be used for virtual visits and or if needed BP checks weekly.  Anatomy US  Explained first scheduled US  will be around 19 weeks.   Last  Pap Diagnosis  Date Value Ref Range Status  10/08/2017   Final   NEGATIVE FOR INTRAEPITHELIAL LESIONS OR MALIGNANCY.    First visit review I reviewed new OB appt with patient. Explained pt will be seen by Dr. Abigail at first visit. Discussed [place] genetic screening with patient. Routine prenatal labs ordered.    Erminio DELENA Rumps, [place] 01/20/2024  9:47 AM

## 2024-01-21 ENCOUNTER — Ambulatory Visit: Payer: Self-pay | Admitting: Obstetrics and Gynecology

## 2024-01-21 DIAGNOSIS — A749 Chlamydial infection, unspecified: Secondary | ICD-10-CM | POA: Insufficient documentation

## 2024-01-21 DIAGNOSIS — O98811 Other maternal infectious and parasitic diseases complicating pregnancy, first trimester: Secondary | ICD-10-CM | POA: Insufficient documentation

## 2024-01-21 LAB — CBC/D/PLT+RPR+RH+ABO+RUBIGG...
Antibody Screen: NEGATIVE
Basophils Absolute: 0 x10E3/uL (ref 0.0–0.2)
Basos: 1 %
EOS (ABSOLUTE): 0 x10E3/uL (ref 0.0–0.4)
Eos: 0 %
HCV Ab: NONREACTIVE
HIV Screen 4th Generation wRfx: NONREACTIVE
Hematocrit: 40.3 % (ref 34.0–46.6)
Hemoglobin: 13 g/dL (ref 11.1–15.9)
Hepatitis B Surface Ag: NEGATIVE
Immature Grans (Abs): 0 x10E3/uL (ref 0.0–0.1)
Immature Granulocytes: 0 %
Lymphocytes Absolute: 1.7 x10E3/uL (ref 0.7–3.1)
Lymphs: 37 %
MCH: 28.8 pg (ref 26.6–33.0)
MCHC: 32.3 g/dL (ref 31.5–35.7)
MCV: 89 fL (ref 79–97)
Monocytes Absolute: 0.4 x10E3/uL (ref 0.1–0.9)
Monocytes: 9 %
Neutrophils Absolute: 2.4 x10E3/uL (ref 1.4–7.0)
Neutrophils: 53 %
Platelets: 331 x10E3/uL (ref 150–450)
RBC: 4.51 x10E6/uL (ref 3.77–5.28)
RDW: 15.8 % — ABNORMAL HIGH (ref 11.7–15.4)
RPR Ser Ql: NONREACTIVE
Rh Factor: POSITIVE
Rubella Antibodies, IGG: 3.12 {index} (ref >0.99)
WBC: 4.6 x10E3/uL (ref 3.4–10.8)

## 2024-01-21 LAB — CERVICOVAGINAL ANCILLARY ONLY
Chlamydia: POSITIVE — AB
Comment: NEGATIVE
Comment: NORMAL
Neisseria Gonorrhea: NEGATIVE

## 2024-01-21 LAB — HCV INTERPRETATION

## 2024-01-21 MED ORDER — AZITHROMYCIN 500 MG PO TABS: 1000.0000 mg | ORAL_TABLET | Freq: Every day | ORAL | 0 refills | Status: DC

## 2024-01-22 LAB — CULTURE, OB URINE

## 2024-01-22 LAB — URINE CULTURE, OB REFLEX

## 2024-01-28 ENCOUNTER — Other Ambulatory Visit: Payer: Self-pay

## 2024-01-28 ENCOUNTER — Ambulatory Visit (INDEPENDENT_AMBULATORY_CARE_PROVIDER_SITE_OTHER)

## 2024-01-28 DIAGNOSIS — Z3A01 Less than 8 weeks gestation of pregnancy: Secondary | ICD-10-CM | POA: Diagnosis not present

## 2024-01-28 DIAGNOSIS — Z3481 Encounter for supervision of other normal pregnancy, first trimester: Secondary | ICD-10-CM | POA: Diagnosis not present

## 2024-01-28 DIAGNOSIS — Z3480 Encounter for supervision of other normal pregnancy, unspecified trimester: Secondary | ICD-10-CM

## 2024-02-16 ENCOUNTER — Encounter: Admitting: Obstetrics and Gynecology

## 2024-02-23 ENCOUNTER — Ambulatory Visit (INDEPENDENT_AMBULATORY_CARE_PROVIDER_SITE_OTHER)

## 2024-02-23 ENCOUNTER — Ambulatory Visit: Admitting: Family Medicine

## 2024-02-23 ENCOUNTER — Other Ambulatory Visit: Payer: Self-pay | Admitting: Obstetrics and Gynecology

## 2024-02-23 ENCOUNTER — Ambulatory Visit (INDEPENDENT_AMBULATORY_CARE_PROVIDER_SITE_OTHER): Admitting: Obstetrics and Gynecology

## 2024-02-23 ENCOUNTER — Other Ambulatory Visit: Payer: Self-pay

## 2024-02-23 VITALS — BP 118/66 | HR 114 | Wt 169.0 lb

## 2024-02-23 DIAGNOSIS — O021 Missed abortion: Secondary | ICD-10-CM | POA: Diagnosis not present

## 2024-02-23 DIAGNOSIS — O3680X0 Pregnancy with inconclusive fetal viability, not applicable or unspecified: Secondary | ICD-10-CM | POA: Diagnosis not present

## 2024-02-23 DIAGNOSIS — Z3A11 11 weeks gestation of pregnancy: Secondary | ICD-10-CM

## 2024-02-23 DIAGNOSIS — Z3A10 10 weeks gestation of pregnancy: Secondary | ICD-10-CM

## 2024-02-23 MED ORDER — LORAZEPAM 0.5 MG PO TABS: 0.5000 mg | ORAL_TABLET | Freq: Three times a day (TID) | ORAL | 0 refills | Status: DC

## 2024-02-23 NOTE — Progress Notes (Signed)
    Chief Complaint  Patient presents with   Initial Prenatal Visit    Subjective:   Kathryn Randall is a 33 y.o. G3P1011 at [redacted]w[redacted]d by LMP c/w 7 wk ultrasound being seen today for her first obstetrical visit.    She feels anxious for today's visit    Review of Systems:   Pertinent items are noted in HPI  Pertinent History Reviewed:  Reviewed past medical,surgical, social and family history.  Reviewed problem list, medications and allergies.  Objective:   Vitals:   02/23/24 0914  BP: 118/66  Pulse: (!) 114  Weight: 169 lb (76.7 kg)   Physical Examination:   General appearance - well appearing, and in no distress  Mental status - alert, oriented to person, place, and time  Psych:  patient tearful and upset    No heart tones by doppler Limited bedside ultrasound with no appreciable heart tones or fetal movement. Difficult to get adequate visualization of fetus due to anterior fibroid and shadowing however early pregnancy failure suspected  Assessment and Plan:  1. Pregnancy with uncertain fetal viability, single or unspecified fetus (Primary) No visualized fetal movement or heart tones appreciated. Recommend formal transvaginal ultrasound for better visualization and confirmation. Discussed very suspicious for miscarriage. Patient understandably very upset. Confirms no thoughts of self harm. Ultrasound coordinated for this afternoon for definitive diagnosis - US  OB LESS THAN 14 WEEKS WITH OB TRANSVAGINAL; Future   Future Appointments  Date Time Provider Department Center  02/23/2024  3:55 PM [facility] US2 [facility]    Rollo ONEIDA Bring, MD, FACOG Obstetrician & Gynecologist, [facility] for [facility], [facility]

## 2024-02-23 NOTE — Progress Notes (Signed)
    Subjective:  Kathryn Randall is a 33 y.o. female who presents to the clinic today for missed AB  HPI:  Was seen in clinic for initial OB appointment today.  She was days off of a 7-week ultrasound.  11 weeks and 0 days today.  Unable to find fetal heart rate on bedside ultrasound at initial OB appointment so she was sent here for formal ultrasound.  Patient has had no bleeding or abdominal pain.  Objective:  Physical Exam: LMP 12/08/2023   Gen: Alert, sad but appropriate CV: Regular rate Pulm: Normal work of breathing MSK: no edema, cyanosis, or clubbing noted Skin: warm, dry Neuro: grossly normal, moves all extremities Psych: Normal affect and thought content  No results found for this or any previous visit (from the past 72 hours).   Assessment/Plan:  Missed abortion Ultrasound today confirming missed abortion.  Fetus measuring 10 weeks and 4 days.  Discussed surgical management with patient and she reports she would like it as soon as possible.  Spoke with on-call gynecologist and messaged surgery scheduler to have scheduled for D&E as soon as possible.  She was hemodynamically stable at this time so no need for emergency D&E.  Discussed MAU precautions with the patient.  Patient does have considerable anxiety and in the past has been on prolonged use of benzodiazepines.  Counseled that I do not prescribe prolonged doses but do not mind given short course to help through this difficult time.  4 tablets of Ativan  0.5 mg sent to patient's pharmacy.   Lab Orders  No laboratory test(s) ordered today    Meds ordered this encounter  Medications   LORazepam  (ATIVAN ) 0.5 MG tablet    Sig: Take 1 tablet (0.5 mg total) by mouth every 8 (eight) hours.    Dispense:  4 tablet    Refill:  0      Steffan Rover, MD Attending Family Medicine Physician, [facility] for [facility], [facility]   02/23/24 4:58 PM

## 2024-02-23 NOTE — Assessment & Plan Note (Signed)
 Ultrasound today confirming missed abortion.  Fetus measuring 10 weeks and 4 days.  Discussed surgical management with patient and she reports she would like it as soon as possible.  Spoke with on-call gynecologist and messaged surgery scheduler to have scheduled for D&E as soon as possible.  She was hemodynamically stable at this time so no need for emergency D&E.  Discussed MAU precautions with the patient.  Patient does have considerable anxiety and in the past has been on prolonged use of benzodiazepines.  Counseled that I do not prescribe prolonged doses but do not mind given short course to help through this difficult time.  4 tablets of Ativan  0.5 mg sent to patient's pharmacy.

## 2024-02-24 ENCOUNTER — Encounter (HOSPITAL_COMMUNITY): Payer: Self-pay | Admitting: Obstetrics & Gynecology

## 2024-02-24 ENCOUNTER — Other Ambulatory Visit (HOSPITAL_COMMUNITY): Payer: Self-pay | Admitting: Obstetrics & Gynecology

## 2024-02-24 ENCOUNTER — Other Ambulatory Visit: Payer: Self-pay

## 2024-02-24 DIAGNOSIS — O021 Missed abortion: Secondary | ICD-10-CM

## 2024-02-24 NOTE — Anesthesia Preprocedure Evaluation (Addendum)
 Anesthesia Evaluation  Patient identified by MRN, date of birth, ID band Patient awake    Reviewed: Allergy & Precautions, NPO status , Patient's Chart, lab work & pertinent test results  Airway Mallampati: I       Dental  (+) Dental Advisory Given   Pulmonary    breath sounds clear to auscultation       Cardiovascular negative cardio ROS  Rhythm:Regular Rate:Tachycardia     Neuro/Psych  Headaches  Anxiety        GI/Hepatic   Endo/Other    Renal/GU      Musculoskeletal   Abdominal   Peds  Hematology   Anesthesia Other Findings   Reproductive/Obstetrics                              Anesthesia Physical Anesthesia Plan  ASA: 2  Anesthesia Plan: General   Post-op Pain Management:    Induction: Intravenous  PONV Risk Score and Plan: 2 and Ondansetron  and Dexamethasone   Airway Management Planned: Oral ETT  Additional Equipment:   Intra-op Plan:   Post-operative Plan: Extubation in OR  Informed Consent:   Plan Discussed with:   Anesthesia Plan Comments:          Anesthesia Quick Evaluation

## 2024-02-24 NOTE — Progress Notes (Signed)
 SDW CALL  Patient was given pre-op instructions over the phone. The opportunity was given for the patient to ask questions. No further questions asked. Patient verbalized understanding of instructions given.   PCP - Glendia Darra RIGGERS Cardiologist - denies  PPM/ICD - denies Device Orders -  Rep Notified -   Chest x-ray - na EKG -  Stress Test - denies ECHO - denies Cardiac Cath - denies  Sleep Study - denies CPAP - no  Fasting Blood Sugar - na Checks Blood Sugar _____ times a day  Blood Thinner Instructions:na Aspirin Instructions:a  ERAS Protcol -no PRE-SURGERY Ensure or G2-   COVID TEST- na   Anesthesia review: no  Patient denies shortness of breath, fever, cough and chest pain over the phone call   Oral Hygiene is also important to reduce your risk of infection.  Remember - BRUSH YOUR TEETH THE MORNING OF SURGERY WITH YOUR REGULAR TOOTHPASTE

## 2024-02-25 ENCOUNTER — Encounter (HOSPITAL_COMMUNITY): Payer: Self-pay | Admitting: Obstetrics & Gynecology

## 2024-02-25 ENCOUNTER — Other Ambulatory Visit: Payer: Self-pay

## 2024-02-25 ENCOUNTER — Ambulatory Visit (HOSPITAL_COMMUNITY): Payer: Self-pay | Admitting: Physician Assistant

## 2024-02-25 ENCOUNTER — Ambulatory Visit (HOSPITAL_COMMUNITY)
Admission: RE | Admit: 2024-02-25 | Discharge: 2024-02-25 | Disposition: A | Discharge status: Home / Self Care | Attending: Obstetrics & Gynecology | Admitting: Obstetrics & Gynecology

## 2024-02-25 ENCOUNTER — Ambulatory Visit (HOSPITAL_COMMUNITY)
Admission: RE | Admit: 2024-02-25 | Discharge: 2024-02-25 | Disposition: A | Discharge status: Home / Self Care | Source: Ambulatory Visit | Attending: Obstetrics & Gynecology | Admitting: Obstetrics & Gynecology

## 2024-02-25 ENCOUNTER — Encounter (HOSPITAL_COMMUNITY)
Admission: RE | Disposition: A | Discharge status: Home / Self Care | Payer: Self-pay | Source: Home / Self Care | Attending: Obstetrics & Gynecology

## 2024-02-25 ENCOUNTER — Other Ambulatory Visit (HOSPITAL_COMMUNITY): Payer: Self-pay

## 2024-02-25 DIAGNOSIS — F419 Anxiety disorder, unspecified: Secondary | ICD-10-CM | POA: Diagnosis not present

## 2024-02-25 DIAGNOSIS — Z3A11 11 weeks gestation of pregnancy: Secondary | ICD-10-CM | POA: Diagnosis not present

## 2024-02-25 DIAGNOSIS — O021 Missed abortion: Secondary | ICD-10-CM

## 2024-02-25 HISTORY — PX: OPERATIVE ULTRASOUND: SHX5996

## 2024-02-25 HISTORY — PX: DILATION AND EVACUATION: SHX1459

## 2024-02-25 LAB — CBC
HCT: 38 % (ref 36.0–46.0)
Hemoglobin: 12.7 g/dL (ref 12.0–15.0)
MCH: 30.2 pg (ref 26.0–34.0)
MCHC: 33.4 g/dL (ref 30.0–36.0)
MCV: 90.3 fL (ref 80.0–100.0)
Platelets: 395 K/uL (ref 150–400)
RBC: 4.21 MIL/uL (ref 3.87–5.11)
RDW: 13.9 % (ref 11.5–15.5)
WBC: 4.3 K/uL (ref 4.0–10.5)
nRBC: 0 % (ref 0.0–0.2)

## 2024-02-25 SURGERY — DILATION AND EVACUATION, UTERUS
Anesthesia: General

## 2024-02-25 MED ORDER — FENTANYL CITRATE (PF) 100 MCG/2ML IJ SOLN: 25.0000 ug | INTRAMUSCULAR | Status: DC | PRN

## 2024-02-25 MED ORDER — PROPOFOL 10 MG/ML IV BOLUS
INTRAVENOUS | Status: DC | PRN
  Administered 2024-02-25: 200 mg via INTRAVENOUS

## 2024-02-25 MED ORDER — LIDOCAINE 2% (20 MG/ML) 5 ML SYRINGE
INTRAMUSCULAR | Status: AC
  Filled 2024-02-25: qty 5

## 2024-02-25 MED ORDER — PROPOFOL 10 MG/ML IV BOLUS
INTRAVENOUS | Status: AC
Start: 2024-02-25 — End: 2024-02-25
  Filled 2024-02-25: qty 20

## 2024-02-25 MED ORDER — ORAL CARE MOUTH RINSE: 15.0000 mL | Freq: Once | OROMUCOSAL | Status: AC

## 2024-02-25 MED ORDER — DOXYCYCLINE HYCLATE 100 MG IV SOLR
200.0000 mg | INTRAVENOUS | Status: AC
  Administered 2024-02-25: 200 mg via INTRAVENOUS
  Filled 2024-02-25: qty 200

## 2024-02-25 MED ORDER — FENTANYL CITRATE (PF) 250 MCG/5ML IJ SOLN
INTRAMUSCULAR | Status: AC
  Filled 2024-02-25: qty 5

## 2024-02-25 MED ORDER — OXYCODONE HCL 5 MG PO TABS: 5.0000 mg | ORAL_TABLET | Freq: Once | ORAL | Status: DC | PRN

## 2024-02-25 MED ORDER — DEXAMETHASONE SODIUM PHOSPHATE 10 MG/ML IJ SOLN
INTRAMUSCULAR | Status: DC | PRN
  Administered 2024-02-25: 10 mg via INTRAVENOUS

## 2024-02-25 MED ORDER — DOCUSATE SODIUM 100 MG PO CAPS
100.0000 mg | ORAL_CAPSULE | Freq: Two times a day (BID) | ORAL | 2 refills | Status: AC | PRN
  Filled 2024-02-25: qty 30, 15d supply, fill #0

## 2024-02-25 MED ORDER — ONDANSETRON HCL 4 MG/2ML IJ SOLN
INTRAMUSCULAR | Status: DC | PRN
  Administered 2024-02-25: 4 mg via INTRAVENOUS

## 2024-02-25 MED ORDER — ACETAMINOPHEN 500 MG PO TABS: 1000.0000 mg | ORAL_TABLET | Freq: Four times a day (QID) | ORAL | Status: AC | PRN

## 2024-02-25 MED ORDER — ONDANSETRON HCL 4 MG/2ML IJ SOLN: 4.0000 mg | Freq: Once | INTRAMUSCULAR | Status: DC | PRN

## 2024-02-25 MED ORDER — DROPERIDOL 2.5 MG/ML IJ SOLN: 0.6250 mg | Freq: Once | INTRAMUSCULAR | Status: DC | PRN

## 2024-02-25 MED ORDER — MIDAZOLAM HCL 2 MG/2ML IJ SOLN
INTRAMUSCULAR | Status: DC | PRN
  Administered 2024-02-25: 2 mg via INTRAVENOUS

## 2024-02-25 MED ORDER — MIDAZOLAM HCL 2 MG/2ML IJ SOLN
INTRAMUSCULAR | Status: AC
  Filled 2024-02-25: qty 2

## 2024-02-25 MED ORDER — FENTANYL CITRATE (PF) 250 MCG/5ML IJ SOLN
INTRAMUSCULAR | Status: DC | PRN
  Administered 2024-02-25 (×2): 50 ug via INTRAVENOUS

## 2024-02-25 MED ORDER — OXYCODONE HCL 5 MG PO TABS
5.0000 mg | ORAL_TABLET | ORAL | 0 refills | Status: DC | PRN
  Filled 2024-02-25: qty 15, 3d supply, fill #0

## 2024-02-25 MED ORDER — LIDOCAINE 2% (20 MG/ML) 5 ML SYRINGE
INTRAMUSCULAR | Status: DC | PRN
Start: 2024-02-25 — End: 2024-02-25
  Administered 2024-02-25: 60 mg via INTRAVENOUS

## 2024-02-25 MED ORDER — SUCCINYLCHOLINE CHLORIDE 200 MG/10ML IV SOSY
PREFILLED_SYRINGE | INTRAVENOUS | Status: DC | PRN
Start: 2024-02-25 — End: 2024-02-25
  Administered 2024-02-25: 100 mg via INTRAVENOUS

## 2024-02-25 MED ORDER — LACTATED RINGERS IV SOLN: INTRAVENOUS | Status: DC

## 2024-02-25 MED ORDER — BUPIVACAINE HCL 0.5 % IJ SOLN
INTRAMUSCULAR | Status: DC | PRN
  Administered 2024-02-25: 30 mL

## 2024-02-25 MED ORDER — BUPIVACAINE HCL (PF) 0.5 % IJ SOLN
INTRAMUSCULAR | Status: AC
  Filled 2024-02-25: qty 30

## 2024-02-25 MED ORDER — IBUPROFEN 600 MG PO TABS
600.0000 mg | ORAL_TABLET | Freq: Four times a day (QID) | ORAL | 2 refills | Status: AC | PRN
  Filled 2024-02-25: qty 30, 8d supply, fill #0

## 2024-02-25 MED ORDER — OXYCODONE HCL 5 MG/5ML PO SOLN: 5.0000 mg | Freq: Once | ORAL | Status: DC | PRN

## 2024-02-25 MED ORDER — CHLORHEXIDINE GLUCONATE 0.12 % MT SOLN
15.0000 mL | Freq: Once | OROMUCOSAL | Status: AC
  Administered 2024-02-25: 15 mL via OROMUCOSAL
  Filled 2024-02-25: qty 15

## 2024-02-25 MED ORDER — CELECOXIB 200 MG PO CAPS
400.0000 mg | ORAL_CAPSULE | ORAL | Status: AC
  Administered 2024-02-25: 400 mg via ORAL
  Filled 2024-02-25: qty 2

## 2024-02-25 MED ORDER — ACETAMINOPHEN 500 MG PO TABS
1000.0000 mg | ORAL_TABLET | ORAL | Status: AC
  Administered 2024-02-25: 1000 mg via ORAL
  Filled 2024-02-25: qty 2

## 2024-02-25 SURGICAL SUPPLY — 22 items
CATH ROBINSON RED A/P 16FR (CATHETERS) ×1 IMPLANT
COVER MAYO STAND STRL (DRAPES) ×1 IMPLANT
FILTER UTR ASPR ASSEMBLY (MISCELLANEOUS) ×1 IMPLANT
GLOVE BIOGEL PI IND STRL 7.0 (GLOVE) ×1 IMPLANT
GLOVE ECLIPSE 7.0 STRL STRAW (GLOVE) ×1 IMPLANT
GOWN STRL REUS W/ TWL LRG LVL3 (GOWN DISPOSABLE) ×2 IMPLANT
HOSE CONNECTING 18IN BERKELEY (TUBING) ×1 IMPLANT
KIT BERKELEY 1ST TRI 3/8 NO TR (MISCELLANEOUS) ×1 IMPLANT
KIT BERKELEY 1ST TRIMESTER 3/8 (MISCELLANEOUS) ×1 IMPLANT
NS IRRIG 1000ML POUR BTL (IV SOLUTION) ×1 IMPLANT
PACK VAGINAL MINOR WOMEN LF (CUSTOM PROCEDURE TRAY) ×1 IMPLANT
PAD OB MATERNITY 11 LF (PERSONAL CARE ITEMS) ×1 IMPLANT
SET BERKELEY SUCTION TUBING (SUCTIONS) ×1 IMPLANT
SPIKE FLUID TRANSFER (MISCELLANEOUS) ×1 IMPLANT
SUT VICRYL 0 UR6 27IN ABS (SUTURE) IMPLANT
TOWEL GREEN STERILE FF (TOWEL DISPOSABLE) ×2 IMPLANT
UNDERPAD 30X36 HEAVY ABSORB (UNDERPADS AND DIAPERS) ×1 IMPLANT
VACURETTE 10 RIGID CVD (CANNULA) IMPLANT
VACURETTE 6 ASPIR F TIP BERK (CANNULA) IMPLANT
VACURETTE 7MM CVD STRL WRAP (CANNULA) IMPLANT
VACURETTE 8 RIGID CVD (CANNULA) IMPLANT
VACURETTE 9 RIGID CVD (CANNULA) IMPLANT

## 2024-02-25 NOTE — Anesthesia Procedure Notes (Signed)
 Procedure Name: Intubation Date/Time: 02/25/2024 11:25 AM  Performed by: Vera Rochele PARAS, CRNAPre-anesthesia Checklist: Patient identified, Emergency Drugs available, Suction available and Patient being monitored Patient Re-evaluated:Patient Re-evaluated prior to induction Oxygen Delivery Method: Circle System Utilized Preoxygenation: Pre-oxygenation with 100% oxygen Induction Type: IV induction and Rapid sequence Laryngoscope Size: Miller and 3 Grade View: Grade I Tube type: Oral Tube size: 7.0 mm Number of attempts: 1 Airway Equipment and Method: Stylet and Oral airway Placement Confirmation: ETT inserted through vocal cords under direct vision, positive ETCO2 and breath sounds checked- equal and bilateral Secured at: 21 cm Tube secured with: Tape Dental Injury: Teeth and Oropharynx as per pre-operative assessment

## 2024-02-25 NOTE — H&P (Signed)
 Preoperative History and Physical  Kathryn Randall is a 33 y.o. G3P1011 here for surgical management of missed abortion measuring about [redacted] weeks gestation.  This is her second MAB in one year, She is appropriately sad, here with her partner.   No significant preoperative concerns.  Proposed surgery: Dilation and Evacuation under ultrasound guidance with chromosomal studies  Past Medical History:  Diagnosis Date   Allergic state 06/28/2011   Anemia 11/14/2011   Anxiety    Anxiety and depression 07/17/2011   Back pain    Cervical cancer screening 05/26/2012   Dental infection 02/04/2016   Depression    Pt. States disorder is being called bipolar   Dysmenorrhea 05/26/2012   Hypokalemia 11/14/2011   Insomnia 02/21/2013   Migraine 06/28/2011   Neck strain 06/28/2012   Overweight(278.02) 04/27/2012   Preventative health care 06/28/2011   Tachycardia 11/14/2011   Past Surgical History:  Procedure Laterality Date   WISDOM TOOTH EXTRACTION  33 yrs old   OB History  Gravida Para Term Preterm AB Living  3 1 1  0 1 1  SAB IAB Ectopic Multiple Live Births  1 0 0 0 1    # Outcome Date GA Lbr Len/2nd Weight Sex Type Anes PTL Lv  3 Current           2 Term 04/04/18 [redacted]w[redacted]d  2705 g M Vag-Spont EPI  LIV  1 SAB           Patient denies any other pertinent gynecologic issues.   No current facility-administered medications on file prior to encounter.   Current Outpatient Medications on File Prior to Encounter  Medication Sig Dispense Refill   butalbital -acetaminophen -caffeine  (FIORICET) 50-325-40 MG tablet Take 2 tablets by mouth 2 (two) times daily as needed for headache.     LORazepam  (ATIVAN ) 0.5 MG tablet Take 1 tablet (0.5 mg total) by mouth every 8 (eight) hours. 4 tablet 0   venlafaxine  XR (EFFEXOR -XR) 75 MG 24 hr capsule TAKE THREE CAPSULES BY MOUTH EVERY MORNING WITH BREAKFAST 90 capsule 11   acetaminophen  (TYLENOL ) 500 MG tablet Take 500 mg by mouth every 4 (four) hours as needed for  mild pain or headache.     ARIPiprazole (ABILIFY) 5 MG tablet Take 5 mg by mouth daily. (Patient not taking: Reported on 02/23/2024)     azithromycin  (ZITHROMAX ) 500 MG tablet Take 2 tablets (1,000 mg total) by mouth daily. (Patient not taking: Reported on 02/23/2024) 2 tablet 0   dibucaine (NUPERCAINAL) 1 % OINT Place 1 application rectally as needed for hemorrhoids. (Patient not taking: Reported on 02/23/2024) 28 g 0   ferrous sulfate  325 (65 FE) MG tablet Take 1 tablet (325 mg total) by mouth daily. (Patient not taking: Reported on 02/23/2024) 30 tablet 0   misoprostol  (CYTOTEC ) 200 MCG tablet Take 2 tablets (400 mcg total) by mouth in the morning, at noon, and at bedtime for 3 days. Please place inside cheek Buccal (Patient not taking: Reported on 02/23/2024) 18 tablet 0   omeprazole  (PRILOSEC) 20 MG capsule Take 1 capsule (20 mg total) by mouth 2 (two) times daily before a meal. (Patient not taking: Reported on 02/23/2024) 60 capsule 5   ondansetron  (ZOFRAN -ODT) 8 MG disintegrating tablet Take 1 tablet (8 mg total) by mouth every 8 (eight) hours as needed for nausea or vomiting. (Patient not taking: Reported on 02/23/2024) 20 tablet 0   Prenatal Vit-Fe Fumarate-FA (MULTIVITAMIN-PRENATAL) 27-0.8 MG TABS tablet Take 1 tablet by mouth daily at 12  noon.     witch hazel-glycerin  (TUCKS) pad Apply 1 application topically as needed for hemorrhoids. (Patient not taking: Reported on 02/23/2024) 40 each 12   Allergies  Allergen Reactions   Morphine Itching   Ibuprofen      Racing heart- when used in large doses     Social History:   reports that she has never smoked. She has never used smokeless tobacco. She reports that she does not currently use alcohol. She reports that she does not currently use drugs after having used the following drugs: Marijuana.  Family History  Problem Relation Age of Onset   Migraines Mother    Arthritis Mother    Hearing loss Mother    Menstrual problems Mother    Migraines  Father    Obesity Maternal Grandmother    Heart disease Maternal Grandmother        CHF   Hypertension Maternal Grandmother    Hearing loss Maternal Grandmother    Arthritis Maternal Grandmother    Menstrual problems Maternal Grandmother    Diabetes Maternal Grandfather        type 2   Obesity Maternal Grandfather    Leukemia Paternal Grandfather    Migraines Paternal Grandfather    Cancer Paternal Grandfather        leukemia   Ovarian cysts Paternal Aunt     Review of Systems: Pertinent items noted in HPI and remainder of comprehensive ROS otherwise negative.  PHYSICAL EXAM: Blood pressure 111/70, pulse (!) 106, temperature 98.7 F (37.1 C), temperature source Oral, resp. rate 18, height 5' 4 (1.626 m), weight 76.7 kg, last menstrual period 12/08/2023, SpO2 98%. CONSTITUTIONAL: Well-developed, well-nourished female in no acute distress.  HENT:  Normocephalic, atraumatic, External right and left ear normal. Oropharynx is clear and moist EYES: Conjunctivae and EOM are normal. Pupils are equal, round, and reactive to light. No scleral icterus.  NECK: Normal range of motion, supple, no masses SKIN: Skin is warm and dry. No rash noted. Not diaphoretic. No erythema. No pallor. NEUROLOGIC: Alert and oriented to person, place, and time. Normal reflexes, muscle tone coordination. No cranial nerve deficit noted. PSYCHIATRIC: Normal mood and affect. Normal behavior. Normal judgment and thought content. CARDIOVASCULAR: Normal heart rate noted, regular rhythm RESPIRATORY: Effort and breath sounds normal, no problems with respiration noted ABDOMEN: Soft, nontender, nondistended. PELVIC: Deferred MUSCULOSKELETAL: Normal range of motion. No edema and no tenderness. 2+ distal pulses.  Labs: Results for orders placed or performed during the hospital encounter of 02/25/24 (from the past 2 weeks)  CBC   Collection Time: 02/25/24  9:02 AM  Result Value Ref Range   WBC 4.3 4.0 - 10.5 K/uL   RBC  4.21 3.87 - 5.11 MIL/uL   Hemoglobin 12.7 12.0 - 15.0 g/dL   HCT 61.9 63.9 - 53.9 %   MCV 90.3 80.0 - 100.0 fL   MCH 30.2 26.0 - 34.0 pg   MCHC 33.4 30.0 - 36.0 g/dL   RDW 86.0 88.4 - 84.4 %   Platelets 395 150 - 400 K/uL   nRBC 0.0 0.0 - 0.2 %    Imaging Studies: 02/24/2024  Clinic Bedside Ultrasound: Absent FHR  Assessment: Principal Problem:   Missed abortion   Plan: Patient will undergo surgical management with Dilation and Evacuation under ultrasound guidance with chromosomal studies.  Risks of surgery including bleeding, infection, injury to surrounding organs, need for additional procedures, possibility of intrauterine scarring which may impair future fertility, risk of retained products which may require further  management and other postoperative/anesthesia complications were explained to patient.  Likelihood of success of complete evacuation of the uterus was discussed with the patient.  Written informed consent was obtained.  Patient has been NPO since last night  she will remain NPO for procedure. Anesthesia and OR aware.  Preoperative prophylactic Doxycycline  200mg  IV  has been ordered and is on call to the OR.  To OR when ready.     GLORIS HUGGER, MD, FACOG Obstetrician & Gynecologist, [facility] for [employer], [facility]

## 2024-02-25 NOTE — Anesthesia Postprocedure Evaluation (Signed)
 Anesthesia Post Note  Patient: Kathryn Randall  Procedure(s) Performed: DILATION AND EVACUATION, UTERUS US  INTRAOPERATIVE     Patient location during evaluation: PACU Anesthesia Type: General Level of consciousness: awake Pain management: pain level controlled Vital Signs Assessment: post-procedure vital signs reviewed and stable Respiratory status: spontaneous breathing and nonlabored ventilation Cardiovascular status: blood pressure returned to baseline Postop Assessment: no apparent nausea or vomiting Anesthetic complications: no   No notable events documented.  Last Vitals:  Vitals:   02/25/24 1245 02/25/24 1300  BP: 110/67 113/74  Pulse: 98 (!) 108  Resp: 16 14  Temp:    SpO2: 99% 97%    Last Pain:  Vitals:   02/25/24 1300  TempSrc:   PainSc: 0-No pain                 Kathryn Randall

## 2024-02-25 NOTE — Transfer of Care (Signed)
 Immediate Anesthesia Transfer of Care Note  Patient: [name]  Procedure(s) Performed: DILATION AND EVACUATION, UTERUS US  INTRAOPERATIVE  Patient Location: PACU  Anesthesia Type:General  Level of Consciousness: drowsy  Airway & Oxygen Therapy: Patient Spontanous Breathing and Patient connected to face mask oxygen  Post-op Assessment: Report given to RN and Post -op Vital signs reviewed and stable  Post vital signs: Reviewed and stable  Last Vitals:  Vitals Value Taken Time  BP 117/72 02/25/24 12:09  Temp    Pulse 98 02/25/24 12:12  Resp 20 02/25/24 12:12  SpO2 100 % 02/25/24 12:12  Vitals shown include unfiled device data.  Last Pain:  Vitals:   02/25/24 0904  TempSrc:   PainSc: 0-No pain         Complications: No notable events documented.

## 2024-02-25 NOTE — Discharge Instructions (Signed)

## 2024-02-25 NOTE — Op Note (Signed)
 [name] PROCEDURE DATE:  02/25/2024  PREOPERATIVE DIAGNOSIS: Intrauterine fetal demise at [redacted]w[redacted]d POSTOPERATIVE DIAGNOSIS: The same PROCEDURE:  Dilation and Evacuation under ultrasound guidance with chromosomal studies SURGEON:  Dr. Gloris Hugger  INDICATIONS: 33 y.o.  [id number] with intrauterine fetal demise at [redacted]w[redacted]d, who desires surgical management.  Risks of surgery were discussed with the patient including but not limited to: bleeding which may require transfusion; infection which may require antibiotics; injury to uterus or surrounding organs; need for additional procedures including laparotomy or laparoscopy; possibility of intrauterine scarring which may impair future fertility; and other postoperative/anesthesia complications. Written informed consent was obtained.  FINDINGS:   Intrauterine fetal demise at [redacted]w[redacted]d (measured around 10 week size on ultrasound). Significant amount of products of conception.  Empty endometrial stripe noted on ultrasound at the end of the procedure.   ANESTHESIA:    General, paracervical block with 30 ml of 0.5% Marcaine  ESTIMATED BLOOD LOSS:  100 ml. SPECIMENS:  Products of conception sent to pathology and some products also sent for microarray analysis [id number]) COMPLICATIONS:  None immediate.  PROCEDURE DETAILS:  The patient received intravenous Doxycycline  while in the preoperative area.  She was then taken to the operating room where general anesthesia  was administered and was found to be adequate.  After an adequate timeout was performed, she was placed in the dorsal lithotomy position and examined; then prepped and draped in the sterile manner.   Her bladder was catheterized for an unmeasured amount of clear, yellow urine. A vaginal speculum was then placed in the patient's vagina and a single tooth tenaculum was applied to the anterior lip of the cervix.  A paracervical block using 30 ml of 0.5% Marcaine  was administered. The cervix was gently dilated  under ultrasound guidance to accommodate a 10 mm suction curette that was gently advanced to the uterine fundus.  The suction device was then activated and curette slowly rotated to clear the uterus of products of conception.  A sharp curettage was then performed to confirm complete emptying of the uterus. There was an empty endometrial stripe noted on the ultrasound at the end of the curettage. There was minimal bleeding noted at the end of the procedure, and the tenaculum removed with good hemostasis noted.   All instruments were removed from the patient's vagina.  Sponge and instrument counts were correct times three.    The patient tolerated the procedure well and was taken to the recovery area awake, extubated and in stable condition.  The patient will be discharged to home as per PACU criteria.  Routine postoperative instructions given.  She was prescribed Oxycodone , Ibuprofen  and Colace.  She will follow up in the office in 2-3 weeks for postoperative evaluation.   GLORIS HUGGER, MD, FACOG Obstetrician & Gynecologist, [facility] for [employer], [facility]

## 2024-02-26 ENCOUNTER — Encounter (HOSPITAL_COMMUNITY): Payer: Self-pay | Admitting: Obstetrics & Gynecology

## 2024-02-26 ENCOUNTER — Encounter: Payer: Self-pay | Admitting: Family Medicine

## 2024-02-26 ENCOUNTER — Ambulatory Visit: Payer: Self-pay | Admitting: Obstetrics & Gynecology

## 2024-02-26 LAB — SURGICAL PATHOLOGY

## 2024-02-29 ENCOUNTER — Inpatient Hospital Stay (HOSPITAL_COMMUNITY)
Admission: AD | Admit: 2024-02-29 | Discharge: 2024-02-29 | Disposition: A | Discharge status: Home / Self Care | Attending: Family Medicine | Admitting: Family Medicine

## 2024-02-29 ENCOUNTER — Encounter: Payer: Self-pay | Admitting: Obstetrics & Gynecology

## 2024-02-29 ENCOUNTER — Encounter (HOSPITAL_COMMUNITY): Payer: Self-pay | Admitting: Obstetrics and Gynecology

## 2024-02-29 ENCOUNTER — Inpatient Hospital Stay (HOSPITAL_COMMUNITY)

## 2024-02-29 DIAGNOSIS — R1030 Lower abdominal pain, unspecified: Secondary | ICD-10-CM | POA: Diagnosis present

## 2024-02-29 DIAGNOSIS — M7989 Other specified soft tissue disorders: Secondary | ICD-10-CM | POA: Diagnosis not present

## 2024-02-29 DIAGNOSIS — Z9889 Other specified postprocedural states: Secondary | ICD-10-CM | POA: Diagnosis not present

## 2024-02-29 LAB — COMPREHENSIVE METABOLIC PANEL WITH GFR
ALT: 9 U/L (ref 0–44)
AST: 18 U/L (ref 15–41)
Albumin: 3.3 g/dL — ABNORMAL LOW (ref 3.5–5.0)
Alkaline Phosphatase: 65 U/L (ref 38–126)
Anion gap: 13 (ref 5–15)
BUN: 8 mg/dL (ref 6–20)
CO2: 23 mmol/L (ref 22–32)
Calcium: 8.4 mg/dL — ABNORMAL LOW (ref 8.9–10.3)
Chloride: 104 mmol/L (ref 98–111)
Creatinine, Ser: 0.77 mg/dL (ref 0.44–1.00)
GFR, Estimated: >60 mL/min (ref >60)
Glucose, Bld: 85 mg/dL (ref 70–99)
Potassium: 3.6 mmol/L (ref 3.5–5.1)
Sodium: 140 mmol/L (ref 135–145)
Total Bilirubin: 0.3 mg/dL (ref 0.0–1.2)
Total Protein: 6.4 g/dL — ABNORMAL LOW (ref 6.5–8.1)

## 2024-02-29 LAB — URINALYSIS, ROUTINE W REFLEX MICROSCOPIC
Bilirubin Urine: NEGATIVE
Glucose, UA: NEGATIVE mg/dL
Ketones, ur: NEGATIVE mg/dL
Nitrite: NEGATIVE
Protein, ur: 30 mg/dL — AB
Specific Gravity, Urine: 1.031 — ABNORMAL HIGH (ref 1.005–1.030)
pH: 5 (ref 5.0–8.0)

## 2024-02-29 LAB — CBC WITH DIFFERENTIAL/PLATELET
Abs Immature Granulocytes: 0.02 K/uL (ref 0.00–0.07)
Basophils Absolute: 0.1 K/uL (ref 0.0–0.1)
Basophils Relative: 1 %
Eosinophils Absolute: 0.1 K/uL (ref 0.0–0.5)
Eosinophils Relative: 2 %
HCT: 32.8 % — ABNORMAL LOW (ref 36.0–46.0)
Hemoglobin: 10.8 g/dL — ABNORMAL LOW (ref 12.0–15.0)
Immature Granulocytes: 0 %
Lymphocytes Relative: 51 %
Lymphs Abs: 3.5 K/uL (ref 0.7–4.0)
MCH: 30.4 pg (ref 26.0–34.0)
MCHC: 32.9 g/dL (ref 30.0–36.0)
MCV: 92.4 fL (ref 80.0–100.0)
Monocytes Absolute: 0.5 K/uL (ref 0.1–1.0)
Monocytes Relative: 8 %
Neutro Abs: 2.6 K/uL (ref 1.7–7.7)
Neutrophils Relative %: 38 %
Platelets: 351 K/uL (ref 150–400)
RBC: 3.55 MIL/uL — ABNORMAL LOW (ref 3.87–5.11)
RDW: 13.9 % (ref 11.5–15.5)
WBC: 6.9 K/uL (ref 4.0–10.5)
nRBC: 0 % (ref 0.0–0.2)

## 2024-02-29 MED ORDER — OXYCODONE HCL 5 MG PO TABS
5.0000 mg | ORAL_TABLET | Freq: Once | ORAL | Status: AC
  Administered 2024-02-29: 5 mg via ORAL
  Filled 2024-02-29: qty 1

## 2024-02-29 MED ORDER — OXYCODONE HCL 5 MG PO TABS: 5.0000 mg | ORAL_TABLET | ORAL | 0 refills | Status: AC | PRN

## 2024-02-29 MED ORDER — ACETAMINOPHEN 500 MG PO TABS
1000.0000 mg | ORAL_TABLET | Freq: Once | ORAL | Status: AC
  Administered 2024-02-29: 1000 mg via ORAL
  Filled 2024-02-29: qty 2

## 2024-02-29 MED ORDER — DOXYCYCLINE HYCLATE 100 MG PO TABS: 100.0000 mg | ORAL_TABLET | Freq: Two times a day (BID) | ORAL | 0 refills | Status: AC

## 2024-02-29 MED ORDER — OXYCODONE HCL 5 MG PO TABS: 5.0000 mg | ORAL_TABLET | Freq: Once | ORAL | Status: DC

## 2024-02-29 NOTE — Discharge Instructions (Signed)
 PAIN MANAGEMENT: Take Tylenol  1000 mg every 6 hours. Take ibuprofen  600 mg every 6 hours. Alternate between Tylenol  and ibuprofen . Take Tylenol , then 3 hours later take ibuprofen , then 3 hours later Tylenol  again and so on. Use the oxycodone  for breakthrough pain not controlled by Tylenol  and ibuprofen .

## 2024-02-29 NOTE — MAU Note (Signed)
 Kathryn Randall is a 33 y.o. at [redacted]w[redacted]d here in MAU reporting: had a D&C on Wed for a MAB. Did ok  the first few days.  Today started having pain in her lower abd/pelvis, is tender to touch.  Temp of 99.1. hasn't really bled much at all. Having bowel movements and is able to urinate.  Onset of complaint: today Pain score: 7 Vitals:   02/29/24 1816  BP: 123/67  Pulse: 100  Resp: 15  Temp: 98.9 F (37.2 C)  SpO2: 99%      Lab orders placed from triage:

## 2024-02-29 NOTE — MAU Provider Note (Cosign Needed)
 Chief Complaint:  Pelvic Pain   HPI   Kathryn Randall is a 33 y.o. [id number] at Unknown who presents to maternity admissions reporting abdominal pain and fever. She underwent ultrasound-guided D&E procedure on 02/25/2024. She has been taking scheduled ibuprofen  around the clock along with the oxycodone  that was prescribed. She reports that pain was well controlled until today when she developed lower abdominal pain. She also had temperature of 99.59F at home at around 1700 that worried her. She also had nausea this morning. Endorses light vaginal bleeding, states it is lighter than a period. Denies malodorous discharge/bleeding. Her last dose of oxycodone  was at 1200, and she had ibuprofen  at 1700. She is having regular bowel movements and is able to urinate. Denies dysuria, urinary urgency or frequency. Additionally, she states that the bruising on her right antecubital space where her IV was placed for surgery is quite painful with pain radiating up her arm to the axilla as well.  Additional history obtained from mother  Past Medical History:  Diagnosis Date   Allergic state 06/28/2011   Anemia 11/14/2011   Anxiety    Anxiety and depression 07/17/2011   Back pain    Cervical cancer screening 05/26/2012   Dental infection 02/04/2016   Depression    Pt. States disorder is being called bipolar   Dysmenorrhea 05/26/2012   Hypokalemia 11/14/2011   Insomnia 02/21/2013   Migraine 06/28/2011   Neck strain 06/28/2012   Overweight(278.02) 04/27/2012   Preventative health care 06/28/2011   Tachycardia 11/14/2011   OB History  Gravida Para Term Preterm AB Living  3 1 1  0 2 1  SAB IAB Ectopic Multiple Live Births  1 0 0 0 1    # Outcome Date GA Lbr Len/2nd Weight Sex Type Anes PTL Lv  3 AB 02/25/24 [redacted]w[redacted]d    SAB     2 Term 04/04/18 [redacted]w[redacted]d  2705 g M Vag-Spont EPI  LIV  1 SAB            Past Surgical History:  Procedure Laterality Date   DILATION AND EVACUATION N/A 02/25/2024   Procedure: DILATION AND  EVACUATION, UTERUS;  Surgeon: Herchel Gloris LABOR, MD;  Location: [facility] OR;  Service: Gynecology;  Laterality: N/A;   OPERATIVE ULTRASOUND N/A 02/25/2024   Procedure: US  INTRAOPERATIVE;  Surgeon: Herchel Gloris LABOR, MD;  Location: [facility] OR;  Service: Gynecology;  Laterality: N/A;   WISDOM TOOTH EXTRACTION  33 yrs old   Family History  Problem Relation Age of Onset   Migraines Mother    Arthritis Mother    Hearing loss Mother    Menstrual problems Mother    Migraines Father    Obesity Maternal Grandmother    Heart disease Maternal Grandmother        CHF   Hypertension Maternal Grandmother    Hearing loss Maternal Grandmother    Arthritis Maternal Grandmother    Menstrual problems Maternal Grandmother    Diabetes Maternal Grandfather        type 2   Obesity Maternal Grandfather    Leukemia Paternal Grandfather    Migraines Paternal Grandfather    Cancer Paternal Grandfather        leukemia   Ovarian cysts Paternal Aunt    Social History   Tobacco Use   Smoking status: Never   Smokeless tobacco: Never   Tobacco comments:    black and milds every once in awhile  Vaping Use   Vaping status: Former  Substance Use Topics  Alcohol use: Not Currently   Drug use: Not Currently    Types: Marijuana   Allergies  Allergen Reactions   Morphine Itching   Ibuprofen      Racing heart- when used in large doses    No medications prior to admission.    I have reviewed patient's Past Medical Hx, Surgical Hx, Family Hx, Social Hx, medications and allergies.   ROS  Pertinent items noted in HPI and remainder of comprehensive ROS otherwise negative.   PHYSICAL EXAM  Patient Vitals for the past 24 hrs:  BP Temp Temp src Pulse Resp SpO2  02/29/24 2334 110/73 -- -- 80 -- --  02/29/24 1828 112/68 -- -- 90 -- --  02/29/24 1816 123/67 98.9 F (37.2 C) Oral 100 15 99 %    Constitutional: Well-developed, well-nourished female in no acute distress.  HEENT: atraumatic, normocephalic. Neck has  normal ROM. EOM intact. Cardiovascular: normal rate & rhythm, warm and well-perfused Respiratory: normal effort, no problems with respiration noted GI: Abd soft, non-distended. TTP over lower abdomen. No rebound or guarding. MSK: Extremities nontender, no edema, normal ROM. Ecchymosis over right antecubital fossa. Tenderness up sulcus between biceps brachii and tricep muscles and in axilla. No palpable axillary lymphadenopathy, no erythema, no streaking. Skin: warm and dry. Acyanotic, no jaundice or pallor. Neurologic: Alert and oriented x 4. No abnormal coordination. Psychiatric: Depressed mood. Flat affect. Speech not slurred, not rapid/pressured. Patient is cooperative. GU: no CVA tenderness  Labs: Results for orders placed or performed during the hospital encounter of 02/29/24 (from the past 24 hours)  Urinalysis, Routine w reflex microscopic -Urine, Clean Catch     Status: Abnormal   Collection Time: 02/29/24  6:19 PM  Result Value Ref Range   Color, Urine YELLOW YELLOW   APPearance HAZY (A) CLEAR   Specific Gravity, Urine 1.031 (H) 1.005 - 1.030   pH 5.0 5.0 - 8.0   Glucose, UA NEGATIVE NEGATIVE mg/dL   Hgb urine dipstick SMALL (A) NEGATIVE   Bilirubin Urine NEGATIVE NEGATIVE   Ketones, ur NEGATIVE NEGATIVE mg/dL   Protein, ur 30 (A) NEGATIVE mg/dL   Nitrite NEGATIVE NEGATIVE   Leukocytes,Ua SMALL (A) NEGATIVE   RBC / HPF 6-10 0 - 5 RBC/hpf   WBC, UA 11-20 0 - 5 WBC/hpf   Bacteria, UA RARE (A) NONE SEEN   Squamous Epithelial / HPF 11-20 0 - 5 /HPF   Mucus PRESENT    Ca Oxalate Crys, UA PRESENT   CBC with Differential/Platelet     Status: Abnormal   Collection Time: 02/29/24  6:55 PM  Result Value Ref Range   WBC 6.9 4.0 - 10.5 K/uL   RBC 3.55 (L) 3.87 - 5.11 MIL/uL   Hemoglobin 10.8 (L) 12.0 - 15.0 g/dL   HCT 67.1 (L) 63.9 - 53.9 %   MCV 92.4 80.0 - 100.0 fL   MCH 30.4 26.0 - 34.0 pg   MCHC 32.9 30.0 - 36.0 g/dL   RDW 86.0 88.4 - 84.4 %   Platelets 351 150 - 400 K/uL    nRBC 0.0 0.0 - 0.2 %   Neutrophils Relative % 38 %   Neutro Abs 2.6 1.7 - 7.7 K/uL   Lymphocytes Relative 51 %   Lymphs Abs 3.5 0.7 - 4.0 K/uL   Monocytes Relative 8 %   Monocytes Absolute 0.5 0.1 - 1.0 K/uL   Eosinophils Relative 2 %   Eosinophils Absolute 0.1 0.0 - 0.5 K/uL   Basophils Relative 1 %  Basophils Absolute 0.1 0.0 - 0.1 K/uL   Immature Granulocytes 0 %   Abs Immature Granulocytes 0.02 0.00 - 0.07 K/uL  Comprehensive metabolic panel     Status: Abnormal   Collection Time: 02/29/24  6:55 PM  Result Value Ref Range   Sodium 140 135 - 145 mmol/L   Potassium 3.6 3.5 - 5.1 mmol/L   Chloride 104 98 - 111 mmol/L   CO2 23 22 - 32 mmol/L   Glucose, Bld 85 70 - 99 mg/dL   BUN 8 6 - 20 mg/dL   Creatinine, Ser 9.22 0.44 - 1.00 mg/dL   Calcium 8.4 (L) 8.9 - 10.3 mg/dL   Total Protein 6.4 (L) 6.5 - 8.1 g/dL   Albumin 3.3 (L) 3.5 - 5.0 g/dL   AST 18 15 - 41 U/L   ALT 9 0 - 44 U/L   Alkaline Phosphatase 65 38 - 126 U/L   Total Bilirubin 0.3 0.0 - 1.2 mg/dL   GFR, Estimated >39 >39 mL/min   Anion gap 13 5 - 15    Imaging:  US  PELVIC COMPLETE WITH TRANSVAGINAL Result Date: 02/29/2024 EXAM: US  Pelvis, Complete Transvaginal and Transabdominal without Doppler TECHNIQUE: Transabdominal and transvaginal pelvic duplex ultrasound using B-mode/gray scaled imaging without Doppler spectral analysis and color flow was obtained. COMPARISON: None provided CLINICAL HISTORY: S/P dilatation and curettage; Lower abdominal pain FINDINGS: UTERUS: Uterus measures 8.0 x 4.5 x 7.9 cm (calculated volume 148 ml). ENDOMETRIAL STRIPE: Endometrium is heterogeneous but avascular, measuring up to 10 mm. RIGHT OVARY: Right ovary measures 3.0 x 2.4 x 1.8 cm (6.6 ml). LEFT OVARY: Left ovary measures 3.4 x 1.9 x 2.2 cm (5.1 ml). FREE FLUID: No free fluid. IMPRESSION: 1. Endometrium is heterogeneous, measuring 10 mm, without vascularity. If there is concern for residual products of conception, correlate with serial  beta-HCG and consider follow-up pelvic ultrasound in 7 days. Electronically signed by: Pinkie Pebbles MD 02/29/2024 10:54 PM EDT RP Workstation: [id number]    MDM & MAU COURSE  MDM: High  MAU Course: -Vital signs within normal limits. Afebrile and normotensive. -Reviewed Dr. Jill operative note, during ultrasound-guided D&E confirmed There was an empty endometrial stripe noted on the ultrasound at the end of the curettage. -CBC to rule out infection. CMP for electrolytes, hepatic/renal function. -UA to rule out UTI. -Tylenol  1000 mg for pain relief, too soon to repeat NSAID. -UA negative for nitrites but does have leukocytes and bacteria, will send culture. -Crystals in urine, may indicate nephrolithiasis. -CBC shows anemia with Hgb 10.8, but no leukocytosis. -CMP without significant abnormalities. -US  to rule out retained POC.   Orders Placed This Encounter  Procedures   Culture, OB Urine   US  PELVIC COMPLETE WITH TRANSVAGINAL   Urinalysis, Routine w reflex microscopic -Urine, Clean Catch   CBC with Differential/Platelet   Comprehensive metabolic panel   Discharge patient   Meds ordered this encounter  Medications   acetaminophen  (TYLENOL ) tablet 1,000 mg   DISCONTD: oxyCODONE  (Oxy IR/ROXICODONE ) immediate release tablet 5 mg    Refill:  0   oxyCODONE  (Oxy IR/ROXICODONE ) immediate release tablet 5 mg    Refill:  0   oxyCODONE  (OXY IR/ROXICODONE ) 5 MG immediate release tablet    Sig: Take 1 tablet (5 mg total) by mouth every 4 (four) hours as needed for severe pain (pain score 7-10) or breakthrough pain.    Dispense:  15 tablet    Refill:  0   doxycycline  (VIBRA -TABS) 100 MG tablet    Sig:  Take 1 tablet (100 mg total) by mouth 2 (two) times daily.    Dispense:  14 tablet    Refill:  0    ASSESSMENT   1. S/P dilatation and curettage   2. Lower abdominal pain    Labs unremarkable, US  negative for retained POC. Pain controlled after one dose of oxycodone . Given  uncontrolled pain, plan for new prescription of oxycodone  and will start on doxycycline  for possible intrauterine infection. Has follow-up scheduled for Monday, 9/22 with OB office to discuss family planning and mood.   PLAN  Discharge home in stable condition with return precautions. Follow up with OB on 9/22.     Allergies as of 02/29/2024 - Review Complete 02/29/2024  Allergen Reaction Noted   Morphine Itching 01/20/2024   Ibuprofen   06/25/2011    Kathryn DELENA Courts, MD

## 2024-03-01 ENCOUNTER — Emergency Department (EMERGENCY_DEPARTMENT_HOSPITAL)

## 2024-03-01 ENCOUNTER — Other Ambulatory Visit: Payer: Self-pay

## 2024-03-01 ENCOUNTER — Emergency Department (HOSPITAL_COMMUNITY)
Admission: EM | Admit: 2024-03-01 | Discharge: 2024-03-01 | Discharge status: Against Medical Advice | Attending: Emergency Medicine | Admitting: Emergency Medicine

## 2024-03-01 ENCOUNTER — Encounter (HOSPITAL_COMMUNITY): Payer: Self-pay

## 2024-03-01 DIAGNOSIS — M79621 Pain in right upper arm: Secondary | ICD-10-CM | POA: Diagnosis present

## 2024-03-01 DIAGNOSIS — Z5321 Procedure and treatment not carried out due to patient leaving prior to being seen by health care provider: Secondary | ICD-10-CM | POA: Insufficient documentation

## 2024-03-01 DIAGNOSIS — M7989 Other specified soft tissue disorders: Secondary | ICD-10-CM

## 2024-03-01 NOTE — ED Provider Triage Note (Signed)
 Emergency Medicine Provider Triage Evaluation Note  Kathryn Randall , a 33 y.o. female  was evaluated in triage.  Pt complains of right upper arm swelling and pain proximal to where she had IV recently.  Recent D&C procedure.  No chest pain or shortness of breath.  Review of Systems  Positive: Upper arm pain Negative: Chest pain  Physical Exam  BP 123/73   Pulse (!) 102   Temp 98.4 F (36.9 C)   Resp 16   LMP 12/08/2023   SpO2 97%  Gen:   Awake, no distress   Resp:  Normal effort  MSK:   Moves extremities without difficulty  Other:  Bruising from recent IV in the right antecubital area, tenderness to palpation in the right upper arm.  Medical Decision Making  Medically screening exam initiated at 3:53 PM.  Appropriate orders placed.  Kathryn Randall was informed that the remainder of the evaluation will be completed by another provider, this initial triage assessment does not replace that evaluation, and the importance of remaining in the ED until their evaluation is complete.  Likely superficial thrombophlebitis, will need DVT study to ensure no deep vein involvement.  Distal circulation, motor, and sensation intact.   Desiderio Chew, PA-C 03/01/24 1556

## 2024-03-01 NOTE — ED Triage Notes (Signed)
 Pt advised this RN that she was no longer waiting to be seen. Pt visibly upset. Pt advised that we were working quickly as we can to get her back into an exam room for further evaluation. Pt witnessed walking out the front door at this time.

## 2024-03-01 NOTE — ED Triage Notes (Signed)
 Patient c/o right arm pain since she had IV placed for procedure, PCP sent her for concerns of DVT. Patient reports hard area of redness in her upper arm. Patient's mother is with her and is being belligerent with staff, appears to be under the influence.

## 2024-03-02 ENCOUNTER — Encounter (HOSPITAL_COMMUNITY): Payer: Self-pay

## 2024-03-02 ENCOUNTER — Emergency Department (HOSPITAL_COMMUNITY)
Admission: EM | Admit: 2024-03-02 | Discharge: 2024-03-02 | Discharge status: Against Medical Advice | Attending: Emergency Medicine | Admitting: Emergency Medicine

## 2024-03-02 ENCOUNTER — Ambulatory Visit: Payer: Self-pay | Admitting: Family Medicine

## 2024-03-02 DIAGNOSIS — Z5321 Procedure and treatment not carried out due to patient leaving prior to being seen by health care provider: Secondary | ICD-10-CM | POA: Insufficient documentation

## 2024-03-02 DIAGNOSIS — M79601 Pain in right arm: Secondary | ICD-10-CM | POA: Diagnosis present

## 2024-03-02 DIAGNOSIS — I82621 Acute embolism and thrombosis of deep veins of right upper extremity: Secondary | ICD-10-CM | POA: Diagnosis not present

## 2024-03-02 DIAGNOSIS — N3000 Acute cystitis without hematuria: Secondary | ICD-10-CM

## 2024-03-02 LAB — CULTURE, OB URINE: Culture: 30000 — AB

## 2024-03-02 MED ORDER — NITROFURANTOIN MONOHYD MACRO 100 MG PO CAPS: 100.0000 mg | ORAL_CAPSULE | Freq: Two times a day (BID) | ORAL | 1 refills | Status: AC

## 2024-03-02 NOTE — ED Triage Notes (Signed)
 Patient arrives ambulatory by POV states she was seen here yesterday but left due to wait time and was told she has a DVT.

## 2024-03-02 NOTE — ED Provider Triage Note (Signed)
 Emergency Medicine Provider Triage Evaluation Note  Kathryn Randall , a 33 y.o. female  was evaluated in triage.  Pt complains of continued right upper extremity pain after recent IV placement.  Patient had a DVT study performed yesterday.  She has upper extremity superficial venous thrombosis.  Symptoms are stable from yesterday.  She has fair amount of tenderness.  No evidence of deep vein thrombosis in the upper extremity. Findings  consistent with acute superficial vein thrombosis involving the right basilic vein.   Review of Systems  Positive: Arm pain Negative: Fever  Physical Exam  BP 124/78   Pulse 92   Temp 98.3 F (36.8 C)   Resp 16   Ht 5' 4 (1.626 m)   Wt 76.7 kg   LMP 12/08/2023   SpO2 100%   BMI 29.01 kg/m  Gen:   Awake, no distress   Resp:  Normal effort  MSK:   Moves extremities without difficulty  Other:  Continued ecchymosis in the right elbow area, tenderness upper arm, exam stable from yesterday  Medical Decision Making  Medically screening exam initiated at 12:54 PM.  Appropriate orders placed.  Kathryn Randall was informed that the remainder of the evaluation will be completed by another provider, this initial triage assessment does not replace that evaluation, and the importance of remaining in the ED until their evaluation is complete.     Desiderio Chew, PA-C 03/02/24 1256

## 2024-03-02 NOTE — ED Notes (Signed)
 Patient in lobby asking about wait time. When informed about wait time patient states Do you just want me to go home and die? When explained the process of the lobby patient proceeded to make an announcement to entire lobby and then began arguing with another patient. Security called.

## 2024-03-02 NOTE — ED Notes (Signed)
 Pt stated they were leaving because we can't do shit for her here. Taking the pt OTF.

## 2024-03-02 NOTE — ED Triage Notes (Signed)
 See Sort RN note.   Pt reports DVT in R arm.  Pain score 7/10.  Pt LWBS yesterday.

## 2024-03-03 ENCOUNTER — Telehealth: Payer: Self-pay

## 2024-03-03 NOTE — Telephone Encounter (Signed)
 Pt called to schedule a self-referred appt for superficial UE clot. Pt left ED without being seen for treatment plan last night. She has been advised to f/u with her PCP. She verbalized understanding and is aware that PCP can refer to us  if PCP felt that was needed.

## 2024-03-03 NOTE — Telephone Encounter (Signed)
 Returned pt's call from VM left on triage line this morning. Her VM is full-unable to leave message.

## 2024-03-04 ENCOUNTER — Ambulatory Visit (INDEPENDENT_AMBULATORY_CARE_PROVIDER_SITE_OTHER): Admitting: Family Medicine

## 2024-03-04 VITALS — BP 109/79 | HR 86 | Ht 64.0 in | Wt 177.0 lb

## 2024-03-04 DIAGNOSIS — F32A Depression, unspecified: Secondary | ICD-10-CM

## 2024-03-04 DIAGNOSIS — F4321 Adjustment disorder with depressed mood: Secondary | ICD-10-CM | POA: Diagnosis not present

## 2024-03-04 DIAGNOSIS — F419 Anxiety disorder, unspecified: Secondary | ICD-10-CM

## 2024-03-04 DIAGNOSIS — O021 Missed abortion: Secondary | ICD-10-CM | POA: Diagnosis not present

## 2024-03-04 NOTE — Progress Notes (Signed)
   Subjective:    Patient ID: Kathryn Randall, female    DOB: 1990/10/07, 33 y.o.   MRN: [id number]  HPI Patient 2 weeks from D&E for missed AB.  She called the office and relayed that she is having a lot of depression and so she was worked in.  She is having difficulty sleeping and eating.  She is having a lot of grieving due to her second miscarriage.  Her first miscarriage was the beginning of the year.  She has been diagnosed with bipolar.  She is on the maximum dose of Effexor : 225 mg XR.  She is also started Caplyta with her PCP, who is [employer], [address]  She attempted to reach his office but he is out of the office this week.  He did see one of his partners, but no new medications were added.  There is also concern of superficial thrombophlebitis.  She was prescribed Lovenox for this by PCP's office.  She is still having a fair amount of pain but it does not appear that the area is getting worse.  Review of Systems     Objective:   Physical Exam Vitals reviewed.  Constitutional:      Appearance: Normal appearance.  Neurological:     General: No focal deficit present.     Mental Status: She is alert and oriented to person, place, and time.  Psychiatric:        Thought Content: Thought content normal.        Judgment: Judgment normal.     Comments: Patient grieving        Assessment & Plan:  1. Missed abortion (Primary) completed Second miscarriage  2. Complicated grieving 3. Anxiety and depression I reemphasized with the patient that miscarriage in her age range is around 15% with over 95% of the miscarriages caused by genetic anomalies.  I emphasized to her that there is nothing that she did wrong to cause this to happen.    I discussed that grieving is normal and offered to refer her to counseling as that would be the best way to process and work through these emotions.  Patient declined counseling referral stating that this never works for her.  She told me that she  does not have thoughts about harming herself, but at the same time does not want to be here she does recognize that she has a child to live for and wants to be around for him, just very depressed and grieving.  She did start the Caplyta about a week to 10 days ago.  We discussed that this is still taking effect and will be continuing to take effect over the next 3 to 4 weeks.  As she is not actively suicidal, I encouraged her to follow-up with her PCP who is providing her psych medications.  I encouraged the patient to reach out with any worsening symptoms.  I also gave her resources for emergency: Behavioral health urgent care and the emergency department for acute suicidal ideation.  32 minutes spent in total with counseling the patient, chart review, etc.

## 2024-03-06 ENCOUNTER — Ambulatory Visit (HOSPITAL_COMMUNITY)
Admission: EM | Admit: 2024-03-06 | Discharge: 2024-03-06 | Disposition: A | Discharge status: Home / Self Care | Attending: Psychiatry | Admitting: Psychiatry

## 2024-03-06 DIAGNOSIS — F4322 Adjustment disorder with anxiety: Secondary | ICD-10-CM

## 2024-03-06 DIAGNOSIS — F4321 Adjustment disorder with depressed mood: Secondary | ICD-10-CM

## 2024-03-06 DIAGNOSIS — F5102 Adjustment insomnia: Secondary | ICD-10-CM | POA: Insufficient documentation

## 2024-03-06 DIAGNOSIS — F4323 Adjustment disorder with mixed anxiety and depressed mood: Secondary | ICD-10-CM | POA: Insufficient documentation

## 2024-03-06 MED ORDER — HYDROXYZINE HCL 25 MG PO TABS: 25.0000 mg | ORAL_TABLET | Freq: Three times a day (TID) | ORAL | 0 refills | Status: AC | PRN

## 2024-03-06 NOTE — Progress Notes (Signed)
   03/06/24 1258  [facility] Triage Screening (Walk-ins at [facility] only)  How Did You Hear About Us ? Hospital Discharge  What Is the Reason for Your Visit/Call Today? Pt presents to [facility] voluntarily accompanied by nana. Pt states she had her first miscarraige in february and again 2 weeks ago, pt states she felt this led to a mental breakdown, which she has never experienced before. Pt states she has been having trouble sleeping for 2 weeks, PA prescibed xanax  whihc has not helped. pt started taking effexor  10 years ago and started abilify yesterday. Pt states that panic attacks have been bad and sleeplessness makes me feel like i am going crazy. Pt is seeking assistance with sleep hygiene and talk therapy. Pt denies SI, HI, AVH, Abuse, Drugs and alcohol.  How Long Has This Been Causing You Problems? 1 wk - 1 month  Have You Recently Had Any Thoughts About Hurting Yourself? No  Are You Planning to Commit Suicide/Harm Yourself At This time? No  Have you Recently Had Thoughts About Hurting Someone Sherral? No  Are You Planning To Harm Someone At This Time? No  Physical Abuse Denies  Verbal Abuse Denies  Sexual Abuse Denies  Exploitation of patient/patient's resources Denies  Self-Neglect Yes, present (Comment) (pt hasnt washed her hair in two weeks)  Are you currently experiencing any auditory, visual or other hallucinations? No  Have You Used Any Alcohol or Drugs in the Past 24 Hours? No  Do you have any current medical co-morbidities that require immediate attention? Yes  Please describe current medical co-morbidities that require immediate attention: miscarraige 2 weeks ago, tacacardia, bipolar I disorder  Clinician description of patient physical appearance/behavior: Pt is casually dressed, calm and communicative.  What Do You Feel Would Help You the Most Today? Treatment for Depression or other mood problem;Medication(s)  Determination of Need Routine (7 days)  Options For Referral Intensive  Outpatient Therapy;Medication Management;BH Urgent Care;Outpatient Therapy

## 2024-03-06 NOTE — Discharge Instructions (Addendum)
 Discharge recommendations:   Medications: Start taking Hydroxyzine 25mg  up to three times a day as needed for anxiety and sleep. Patient is to take medications as prescribed. The patient or patient's guardian is to contact a medical professional and/or outpatient provider to address any new side effects that develop. The patient or the patient's guardian should update outpatient providers of any new medications and/or medication changes.    Outpatient Follow up: Referral sent to [facility] for the Intensive Outpatient Program. Please review list of outpatient resources for psychiatry and counseling. Please follow up with your primary care provider for all medical related needs.    Therapy: We recommend that patient participate in individual therapy to address mental health concerns.   Atypical antipsychotics: If you are prescribed an atypical antipsychotic, it is recommended that your height, weight, BMI, blood pressure, fasting lipid panel, and fasting blood sugar be monitored by your outpatient providers.  Safety:   The following safety precautions should be taken:   No sharp objects. This includes scissors, razors, scrapers, and putty knives.   Chemicals should be removed and locked up.   Medications should be removed and locked up.   Weapons should be removed and locked up. This includes firearms, knives and instruments that can be used to cause injury.   The patient should abstain from use of illicit substances/drugs and abuse of any medications.  If symptoms worsen or do not continue to improve or if the patient becomes actively suicidal or homicidal then it is recommended that the patient return to the closest hospital emergency department, the [facility], or call 911 for further evaluation and treatment. National Suicide Prevention Lifeline 1-800-SUICIDE or 714-518-7193.  About 988 988 offers 24/7 access to trained crisis counselors who can  help people experiencing mental health-related distress. People can call or text 988 or chat 988lifeline.org for themselves or if they are worried about a loved one who may need crisis support.   [facility] for outpatient treatment:  Walk in/ Open Access Hours: Monday - Friday 8AM - 10AM (To see provider and therapist) Arrive by 7:00am for initial walk-in assessment.   [facility] [address] [place], [place] [phone]

## 2024-03-06 NOTE — ED Provider Notes (Signed)
 [facility]al [facility] Medical Screening Exam  Patient Name: Kathryn Randall MRN: [id number] Date of Evaluation: 03/06/24 Chief Complaint:  Very anxious and unable to sleep Diagnosis:  Final diagnoses:  Adjustment disorder with anxious mood  Grief associated with loss of fetus  Adjustment insomnia    History of Present illness: Kathryn Randall 33 y.o., female patient presented to [facility] as a voluntary walk in accompanied by her grandmother with complaints of insomnia and worsening anxiety since miscarrying twice this year. Kathryn Randall, is seen face to face by this provider and chart reviewed on 03/06/24.  Per chart review, pt has a PPHx of insomnia, anxiety  and depression.  She is currently prescribed Effexor  and PRN Xanax  but reports that it has not been effective at all. She was started on Abilify 5mg  yesterday by her PCP. Medical hx significant for D&C procedure d/t loss of fetus on 02/29/24, anemia, and migraines.   On evaluation Kathryn Randall reports that she has experienced 2 miscarriages this year in February and a couple of weeks ago. She states that she has struggled with anxiety for some years now and that after the first miscarriage she was able to manage with support and her medications.  Patient states that this time she is unable to manage her anxiety and depression this time. She reports not performing ADLs, crying spells, panic attacks, no motivation or energy and feeling like she is constantly worried. She also reports having flashbacks of finding out that there was no fetal heartbeat at the doctor's office. Pt reports that she is unable to sleep and has been getting about 2 hrs of sleep per day and it is driving me crazy bc I am so exhausted. Pt reports that her mind races and worries when trying to sleep. She denies SI/HI and AVH. She denies hx of self harm and suicide attempts. She currently lives at home with her husband and 57 yr old son who are very  supportive. She also has support from her family. She is not currently in therapy and feels that it would be helpful to have access to therapy throughout the week. Discussed option of IOP which pt is agreeable to participating in as she is not currently employed and has the time availability to do so.   During evaluation Kathryn Randall is sitting up in assessment room and appears very restless and anxious.  She is alert & oriented x 4, cooperative and attentive for this assessment.  Her mood is anxious with congruent affect.  She has normal speech, and restless behavior.  Objectively there is no evidence of psychosis/mania or delusional thinking. Pt does not appear to be responding to internal or external stimuli.  Patient is able to converse coherently, goal directed thoughts, no distractibility, or pre-occupation.  She also denies suicidal/self-harm/homicidal ideation, psychosis, and paranoia.  Patient answered assessment questions appropriately.      Flowsheet Row ED from 03/06/2024 in [facility] ED from 03/02/2024 in [facility] Emergency Department at [facility] ED from 03/01/2024 in [facility] Emergency Department at [facility]  C-SSRS RISK CATEGORY No Risk No Risk No Risk    Psychiatric Specialty Exam  Presentation  General Appearance:Casual  Eye Contact:Fair  Speech:Clear and Coherent  Speech Volume:Normal  Handedness:Right   Mood and Affect  Mood: Anxious  Affect: Congruent   Thought Process  Thought Processes: Coherent  Descriptions of Associations:Intact  Orientation:Full (Time, Place and Person)  Thought  Content:WDL    Hallucinations:None  Ideas of Reference:None  Suicidal Thoughts:No  Homicidal Thoughts:No   Sensorium  Memory: Recent Fair; Immediate Good  Judgment: Fair  Insight: Fair   Chartered certified accountant: Fair  Attention Span: Fair  Recall: [employer] of  Knowledge: Fair  Language: Fair   Psychomotor Activity  Psychomotor Activity: Restlessness   Assets  Assets: Manufacturing systems engineer; Desire for Improvement; Financial Resources/Insurance; Housing; Physical Health; Resilience; Social Support; Intimacy; Leisure Time; Transportation   Sleep  Sleep: Poor  Number of hours:  2   Physical Exam: Physical Exam Vitals and nursing note reviewed.  Constitutional:      Appearance: Normal appearance.  HENT:     Head: Normocephalic.     Nose: Nose normal.  Eyes:     Extraocular Movements: Extraocular movements intact.  Cardiovascular:     Rate and Rhythm: Normal rate.  Pulmonary:     Effort: Pulmonary effort is normal.  Musculoskeletal:        General: Normal range of motion.     Cervical back: Normal range of motion.  Neurological:     General: No focal deficit present.     Mental Status: She is alert and oriented to person, place, and time.    Review of Systems  Constitutional: Negative.   HENT: Negative.    Eyes: Negative.   Respiratory: Negative.    Cardiovascular: Negative.   Gastrointestinal: Negative.   Genitourinary: Negative.   Musculoskeletal: Negative.   Neurological: Negative.   Endo/Heme/Allergies: Negative.   Psychiatric/Behavioral:  Positive for depression. The patient is nervous/anxious and has insomnia.    Blood pressure 106/69, pulse 90, temperature 98.7 F (37.1 C), temperature source Oral, resp. rate 16, last menstrual period 12/08/2023, SpO2 99%. There is no height or weight on file to calculate BMI.  Musculoskeletal: Strength & Muscle Tone: within normal limits Gait & Station: normal Patient leans: N/A   [facility] MSE Discharge Disposition for Follow up and Recommendations: Based on my evaluation the patient does not appear to have an emergency medical condition and can be discharged with resources and follow up care in outpatient services for Medication Management, Partial Hospitalization  Program, and Individual Therapy Pt discharged from [facility] after evaluation. Pt deneis SI, HI and AVH. She is agreeable to participating in IOP treatment, so referral sent to [facility] for virtual IOP. Pt is also made aware of option to return to [facility] for open access/ walk-in hours on Monday if needed. Pt was provided with 10 day supply of Hydroxyzine 25mg  TID PRN for anxiety and insomnia. Pt educated on medication and verbalizes understanding. Briefly dicussed plan with her grandmother, with whom she was discharging with.   Alan JAYSON Mcardle, NP 03/06/2024, 5:55 PM

## 2024-03-08 ENCOUNTER — Ambulatory Visit (INDEPENDENT_AMBULATORY_CARE_PROVIDER_SITE_OTHER): Admitting: Obstetrics & Gynecology

## 2024-03-08 ENCOUNTER — Encounter: Payer: Self-pay | Admitting: Obstetrics & Gynecology

## 2024-03-08 VITALS — BP 109/68 | HR 84 | Wt 172.0 lb

## 2024-03-08 DIAGNOSIS — Z09 Encounter for follow-up examination after completed treatment for conditions other than malignant neoplasm: Secondary | ICD-10-CM | POA: Diagnosis not present

## 2024-03-08 NOTE — Progress Notes (Signed)
   POSTOPERATIVE VISIT   Subjective:     Kathryn Randall is a 33 y.o. female who presents to the clinic 2 weeks status post Dilation and Evacuation for Missed Abortion at 11 weeks on 02/25/24.  Was seen on 03/04/24 for a mood check, had an extensive discussion with Dr. Barbra (please see his note).  Patient is still very angry at [employer] taking away two babies from her. Denies any SI/HI.  Accompanied by her significant other.  She reports having some bleeding and lower abdominal pain that started today, this is the only occurrence since her procedure on 02/25/24. Eating a regular diet without difficulty. Bowel movements are normal. Pain is controlled with current analgesics. Medications being used: prescription NSAID's including ibuprofen  (Motrin ).  The following portions of the patient's history were reviewed and updated as appropriate: allergies, current medications, past family history, past medical history, past social history, past surgical history, and problem list.  Review of Systems Pertinent items noted in HPI and remainder of comprehensive ROS otherwise negative.    Objective:   BP 109/68 (BP Location: Left Arm, Patient Position: Sitting, Cuff Size: Normal)   Pulse 84   Wt 172 lb (78 kg)   LMP 12/08/2023   Breastfeeding No   BMI 29.52 kg/m  General:  alert and no distress  Abdomen: soft, bowel sounds active, mild RLQ tenderness on palpation, no rebound, no guarding  Pelvic:   deferred   02/25/2024  Surgical Pathology PRODUCTS OF CONCEPTION, DILATION AND EVACUATION:       Chorionic villi and decidua consistent with products of conception     Assessment:   Operative findings again reviewed. Pathology report discussed. [facility] report pending.    Plan:    1. Continue any current medications, advised to take Ibuprofen  and Tylenol  as needed. 2. Reassured about her bleeding, not concerned about this for now.  Bleeding and pain precautions reviewed. 3. Patient to follow up  with mental health provider as scheduled. 4. Patient's last pap was in 2019, she was offered pap smear today but she declined this. She was told to let us  know if she changes her mind. 5. Follow up as needed.    GLORIS HUGGER, MD, FACOG Obstetrician & Gynecologist, [facility] for [employer], [facility]

## 2024-03-15 ENCOUNTER — Other Ambulatory Visit (HOSPITAL_COMMUNITY): Payer: Self-pay

## 2024-03-30 ENCOUNTER — Encounter: Payer: Self-pay | Admitting: Obstetrics & Gynecology

## 2024-04-02 LAB — ANORA MISCARRIAGE TEST - FRESH

## 2024-05-17 NOTE — Progress Notes (Signed)
 Patient came into the office on 05/17/24 at 1pm to pick up products of conception from [facility].  Ata Pecha l Rhylee Nunn, CMA
